# Patient Record
Sex: Female | Born: 1990 | Race: Black or African American | Hispanic: No | Marital: Single | Smoking: Current every day smoker
Health system: Southern US, Community
[De-identification: ages and names within clinical notes are randomized; demographics above are authoritative.]

## PROBLEM LIST (undated history)

## (undated) ENCOUNTER — Inpatient Hospital Stay (HOSPITAL_COMMUNITY): Payer: Self-pay

## (undated) DIAGNOSIS — Z87891 Personal history of nicotine dependence: Secondary | ICD-10-CM

## (undated) DIAGNOSIS — J302 Other seasonal allergic rhinitis: Secondary | ICD-10-CM

## (undated) DIAGNOSIS — E669 Obesity, unspecified: Secondary | ICD-10-CM

## (undated) DIAGNOSIS — F1290 Cannabis use, unspecified, uncomplicated: Secondary | ICD-10-CM

## (undated) DIAGNOSIS — O24419 Gestational diabetes mellitus in pregnancy, unspecified control: Secondary | ICD-10-CM

## (undated) DIAGNOSIS — D649 Anemia, unspecified: Secondary | ICD-10-CM

## (undated) HISTORY — DX: Personal history of nicotine dependence: Z87.891

## (undated) HISTORY — DX: Cannabis use, unspecified, uncomplicated: F12.90

## (undated) HISTORY — PX: WISDOM TOOTH EXTRACTION: SHX21

---
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
## 2012-09-11 ENCOUNTER — Emergency Department (HOSPITAL_COMMUNITY)
Admission: EM | Admit: 2012-09-11 | Discharge: 2012-09-11 | Disposition: A | Discharge status: Home / Self Care | Payer: Medicaid Other | Attending: Emergency Medicine | Admitting: Emergency Medicine

## 2012-09-11 ENCOUNTER — Encounter (HOSPITAL_COMMUNITY): Payer: Self-pay | Admitting: Family Medicine

## 2012-09-11 DIAGNOSIS — J45909 Unspecified asthma, uncomplicated: Secondary | ICD-10-CM | POA: Insufficient documentation

## 2012-09-11 DIAGNOSIS — Z3202 Encounter for pregnancy test, result negative: Secondary | ICD-10-CM | POA: Insufficient documentation

## 2012-09-11 DIAGNOSIS — N920 Excessive and frequent menstruation with regular cycle: Secondary | ICD-10-CM

## 2012-09-11 DIAGNOSIS — Z79899 Other long term (current) drug therapy: Secondary | ICD-10-CM | POA: Insufficient documentation

## 2012-09-11 DIAGNOSIS — F17200 Nicotine dependence, unspecified, uncomplicated: Secondary | ICD-10-CM | POA: Insufficient documentation

## 2012-09-11 LAB — URINE MICROSCOPIC-ADD ON

## 2012-09-11 LAB — URINALYSIS, ROUTINE W REFLEX MICROSCOPIC
Glucose, UA: NEGATIVE mg/dL
Specific Gravity, Urine: 1.027 (ref 1.005–1.030)
pH: 6.5 (ref 5.0–8.0)

## 2012-09-11 LAB — POCT I-STAT, CHEM 8
BUN: 15 mg/dL (ref 6–23)
Calcium, Ion: 1.27 mmol/L — ABNORMAL HIGH (ref 1.12–1.23)
HCT: 30 % — ABNORMAL LOW (ref 36.0–46.0)
Hemoglobin: 10.2 g/dL — ABNORMAL LOW (ref 12.0–15.0)
Sodium: 141 mEq/L (ref 135–145)
TCO2: 24 mmol/L (ref 0–100)

## 2012-09-11 LAB — POCT PREGNANCY, URINE: Preg Test, Ur: NEGATIVE

## 2012-09-11 MED ORDER — MEDROXYPROGESTERONE ACETATE 10 MG PO TABS: 10.0000 mg | ORAL_TABLET | Freq: Every day | ORAL | Status: DC

## 2012-09-11 MED ORDER — SODIUM CHLORIDE 0.9 % IV BOLUS (SEPSIS)
1000.0000 mL | Freq: Once | INTRAVENOUS | Status: AC
  Administered 2012-09-11: 1000 mL via INTRAVENOUS

## 2012-09-11 NOTE — ED Provider Notes (Signed)
CSN: [id number]     Arrival date & time 09/11/12  1910 History   First MD Initiated Contact with Patient 09/11/12 2044     Chief Complaint  Patient presents with  . Vaginal Bleeding   (Consider location/radiation/quality/duration/timing/severity/associated sxs/prior Treatment) HPI Patient presents with vaginal bleeding for the past 3 days. The patient states that this has been heavier than her period. The patient states, that she does not have any headache, blurred vision, fever, nausea, vomiting, weakness, numbness, dizziness, chest pain, or shortness of breath.  Patient, states, that she has had some irregular periods in the past.  Patient denies take any medications for her symptoms. Past Medical History  Diagnosis Date  . Asthma    History reviewed. No pertinent past surgical history. No family history on file. History  Substance Use Topics  . Smoking status: Current Every Day Smoker -- 0.25 packs/day    Types: Cigarettes  . Smokeless tobacco: Not on file  . Alcohol Use: Yes     Comment: Occasional    OB History   Grav Para Term Preterm Abortions TAB SAB Ect Mult Living                 Review of Systems All other systems negative except as documented in the HPI. All pertinent positives and negatives as reviewed in the HPI.  Allergies  Review of patient's allergies indicates no known allergies.  Home Medications   Current Outpatient Rx  Name  Route  Sig  Dispense  Refill  . albuterol (PROVENTIL HFA;VENTOLIN HFA) 108 (90 BASE) MCG/ACT inhaler   Inhalation   Inhale 2 puffs into the lungs every 6 (six) hours as needed for wheezing or shortness of breath.           BP 106/68  Pulse 84  Temp(Src) 98.6 F (37 C) (Oral)  Resp 15  Ht 5\' 6"  (1.676 m)  Wt 241 lb 12.8 oz (109.68 kg)  BMI 39.05 kg/m2  SpO2 100%  LMP 05/20/2012 Physical Exam  Nursing note and vitals reviewed. Constitutional: She is oriented to person, place, and time. She appears well-developed and  well-nourished. No distress.  HENT:  Head: Normocephalic and atraumatic.  Mouth/Throat: Oropharynx is clear and moist.  Neck: Normal range of motion. Neck supple.  Cardiovascular: Normal rate and regular rhythm.   Pulmonary/Chest: Effort normal and breath sounds normal.  Abdominal: Soft. Bowel sounds are normal. She exhibits no distension. There is no tenderness.  Genitourinary:  Patient defers.  Pelvic exam at this time  Neurological: She is alert and oriented to person, place, and time.  Skin: Skin is warm and dry.    ED Course  Procedures (including critical care time) Labs Review Labs Reviewed  URINALYSIS, ROUTINE W REFLEX MICROSCOPIC - Abnormal; Notable for the following:    APPearance CLOUDY (*)    Hgb urine dipstick LARGE (*)    Leukocytes, UA TRACE (*)    All other components within normal limits  POCT I-STAT, CHEM 8 - Abnormal; Notable for the following:    Creatinine, Ser 1.20 (*)    Calcium, Ion 1.27 (*)    Hemoglobin 10.2 (*)    HCT 30.0 (*)    All other components within normal limits  URINE MICROSCOPIC-ADD ON  POCT PREGNANCY, URINE   Patient is referred to GYN clinic at [facility] hospital.  Patient, states, that she will followup as directed MDM      Carlyle Dolly, PA-C 09/16/12 1600

## 2012-09-11 NOTE — ED Notes (Addendum)
Patient states that she has been having vaginal bleeding for the past month. States flow is heavy and passing clots. States she could possibly be pregnant. States that her menstrual cycle is irregular. Has not seen a medical provider for this bleeding.

## 2012-09-17 NOTE — ED Provider Notes (Signed)
  Medical screening examination/treatment/procedure(s) were performed by non-physician practitioner and as supervising physician I was immediately available for consultation/collaboration.    Elana Jian, MD 09/17/12 0727 

## 2012-10-11 ENCOUNTER — Emergency Department (HOSPITAL_COMMUNITY)
Admission: EM | Admit: 2012-10-11 | Discharge: 2012-10-11 | Disposition: A | Discharge status: Home / Self Care | Payer: Medicaid Other | Attending: Emergency Medicine | Admitting: Emergency Medicine

## 2012-10-11 ENCOUNTER — Encounter (HOSPITAL_COMMUNITY): Payer: Self-pay | Admitting: Emergency Medicine

## 2012-10-11 DIAGNOSIS — F17200 Nicotine dependence, unspecified, uncomplicated: Secondary | ICD-10-CM | POA: Insufficient documentation

## 2012-10-11 DIAGNOSIS — IMO0002 Reserved for concepts with insufficient information to code with codable children: Secondary | ICD-10-CM | POA: Insufficient documentation

## 2012-10-11 DIAGNOSIS — J45909 Unspecified asthma, uncomplicated: Secondary | ICD-10-CM | POA: Insufficient documentation

## 2012-10-11 DIAGNOSIS — S4980XA Other specified injuries of shoulder and upper arm, unspecified arm, initial encounter: Secondary | ICD-10-CM | POA: Insufficient documentation

## 2012-10-11 DIAGNOSIS — Z79899 Other long term (current) drug therapy: Secondary | ICD-10-CM | POA: Insufficient documentation

## 2012-10-11 DIAGNOSIS — S46909A Unspecified injury of unspecified muscle, fascia and tendon at shoulder and upper arm level, unspecified arm, initial encounter: Secondary | ICD-10-CM | POA: Insufficient documentation

## 2012-10-11 DIAGNOSIS — S0993XA Unspecified injury of face, initial encounter: Secondary | ICD-10-CM | POA: Insufficient documentation

## 2012-10-11 MED ORDER — HYDROCODONE-ACETAMINOPHEN 5-325 MG PO TABS: 2.0000 | ORAL_TABLET | ORAL | Status: DC | PRN

## 2012-10-11 MED ORDER — HYDROCODONE-ACETAMINOPHEN 5-325 MG PO TABS
2.0000 | ORAL_TABLET | Freq: Once | ORAL | Status: AC
  Administered 2012-10-11: 2 via ORAL
  Filled 2012-10-11: qty 2
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
## 2012-10-12 ENCOUNTER — Encounter (HOSPITAL_COMMUNITY): Payer: Self-pay | Admitting: Emergency Medicine

## 2012-10-12 ENCOUNTER — Emergency Department (HOSPITAL_COMMUNITY)
Admission: EM | Admit: 2012-10-12 | Discharge: 2012-10-12 | Disposition: A | Discharge status: Home / Self Care | Payer: Medicaid Other | Attending: Emergency Medicine | Admitting: Emergency Medicine

## 2012-10-12 DIAGNOSIS — R300 Dysuria: Secondary | ICD-10-CM | POA: Insufficient documentation

## 2012-10-12 DIAGNOSIS — F17200 Nicotine dependence, unspecified, uncomplicated: Secondary | ICD-10-CM | POA: Insufficient documentation

## 2012-10-12 DIAGNOSIS — J3489 Other specified disorders of nose and nasal sinuses: Secondary | ICD-10-CM | POA: Insufficient documentation

## 2012-10-12 DIAGNOSIS — R109 Unspecified abdominal pain: Secondary | ICD-10-CM | POA: Insufficient documentation

## 2012-10-12 DIAGNOSIS — E669 Obesity, unspecified: Secondary | ICD-10-CM | POA: Insufficient documentation

## 2012-10-12 DIAGNOSIS — Z79899 Other long term (current) drug therapy: Secondary | ICD-10-CM | POA: Insufficient documentation

## 2012-10-12 DIAGNOSIS — M545 Low back pain, unspecified: Secondary | ICD-10-CM | POA: Insufficient documentation

## 2012-10-12 DIAGNOSIS — S39012A Strain of muscle, fascia and tendon of lower back, initial encounter: Secondary | ICD-10-CM

## 2012-10-12 DIAGNOSIS — Z3202 Encounter for pregnancy test, result negative: Secondary | ICD-10-CM | POA: Insufficient documentation

## 2012-10-12 DIAGNOSIS — G44209 Tension-type headache, unspecified, not intractable: Secondary | ICD-10-CM | POA: Insufficient documentation

## 2012-10-12 DIAGNOSIS — Z87440 Personal history of urinary (tract) infections: Secondary | ICD-10-CM | POA: Insufficient documentation

## 2012-10-12 DIAGNOSIS — R350 Frequency of micturition: Secondary | ICD-10-CM | POA: Insufficient documentation

## 2012-10-12 DIAGNOSIS — G8911 Acute pain due to trauma: Secondary | ICD-10-CM | POA: Insufficient documentation

## 2012-10-12 DIAGNOSIS — J45909 Unspecified asthma, uncomplicated: Secondary | ICD-10-CM | POA: Insufficient documentation

## 2012-10-12 DIAGNOSIS — R1030 Lower abdominal pain, unspecified: Secondary | ICD-10-CM

## 2012-10-12 HISTORY — DX: Obesity, unspecified: E66.9

## 2012-10-12 LAB — URINE MICROSCOPIC-ADD ON

## 2012-10-12 LAB — URINALYSIS, ROUTINE W REFLEX MICROSCOPIC
Bilirubin Urine: NEGATIVE
Glucose, UA: NEGATIVE mg/dL
Hgb urine dipstick: NEGATIVE
Ketones, ur: NEGATIVE mg/dL
Protein, ur: NEGATIVE mg/dL
Urobilinogen, UA: 1 mg/dL (ref 0.0–1.0)
pH: 6 (ref 5.0–8.0)

## 2012-10-12 MED ORDER — HYDROCODONE-ACETAMINOPHEN 5-325 MG PO TABS: 1.0000 | ORAL_TABLET | Freq: Four times a day (QID) | ORAL | Status: DC | PRN

## 2012-10-12 NOTE — ED Notes (Signed)
Pt in gown.

## 2012-10-12 NOTE — ED Notes (Signed)
Reports intermittent headaches and lower abd pain that radiates around to her back, reports that her bladder hurts. No acute distress noted at triage.
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
## 2012-10-12 NOTE — ED Notes (Signed)
Pt reports she is here because she has been having frequent HAs on and off for a few months, has been taking ibuprofen at home and it works but then the HAs come back later on. No neuro deficits seen. Pt also c/o right flank pain X 2 months and bilateral lower/pelvic pain that started last week. sts sometimes painful to urinate along with urinary frequency. Denies vaginal d/c. Denies fevers at home. Pt in nad, skin warm and dry, resp e/u.

## 2012-10-13 NOTE — ED Provider Notes (Signed)
Medical screening examination/treatment/procedure(s) were performed by non-physician practitioner and as supervising physician I was immediately available for consultation/collaboration.  Ismael Karge R. Joesiah Lonon, MD 10/13/12 2336 

## 2012-10-15 LAB — URINE CULTURE: Colony Count: >=100000

## 2012-10-18 NOTE — ED Provider Notes (Signed)
I saw and evaluated the patient, reviewed the resident's note and I agree with the findings and plan.   .Face to face Exam:  General:  Awake HEENT:  Atraumatic Resp:  Normal effort Abd:  Nondistended Neuro:No focal weakness   Nelia Shi, MD 10/18/12 [phone]

## 2012-10-30 ENCOUNTER — Encounter (HOSPITAL_COMMUNITY): Payer: Self-pay | Admitting: Emergency Medicine

## 2012-10-30 ENCOUNTER — Emergency Department (INDEPENDENT_AMBULATORY_CARE_PROVIDER_SITE_OTHER)
Admission: EM | Admit: 2012-10-30 | Discharge: 2012-10-30 | Disposition: A | Discharge status: Home / Self Care | Payer: Medicaid Other | Source: Home / Self Care | Attending: Family Medicine | Admitting: Family Medicine

## 2012-10-30 DIAGNOSIS — M62838 Other muscle spasm: Secondary | ICD-10-CM

## 2012-10-30 MED ORDER — CYCLOBENZAPRINE HCL 10 MG PO TABS: 10.0000 mg | ORAL_TABLET | Freq: Two times a day (BID) | ORAL | Status: DC | PRN
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
## 2012-10-30 NOTE — ED Notes (Signed)
Pt c/o neck/upper back pain onset this am when she woke up Pain increases when she turns to the right Denies inj/trauma, strenuous activity Alert w/no signs of acute distress.

## 2012-11-05 NOTE — ED Provider Notes (Signed)
Medical screening examination/treatment/procedure(s) were performed by resident physician or non-physician practitioner and as supervising physician I was immediately available for consultation/collaboration.   Tiari Andringa DOUGLAS MD.   Aysa Larivee D Juvencio Verdi, MD 11/05/12 1501 

## 2012-12-10 ENCOUNTER — Institutional Professional Consult (permissible substitution): Payer: Self-pay | Admitting: Pediatrics

## 2012-12-20 ENCOUNTER — Ambulatory Visit: Payer: Medicaid Other | Admitting: *Deleted

## 2012-12-20 DIAGNOSIS — Z3200 Encounter for pregnancy test, result unknown: Secondary | ICD-10-CM

## 2012-12-20 DIAGNOSIS — N912 Amenorrhea, unspecified: Secondary | ICD-10-CM

## 2012-12-20 NOTE — Progress Notes (Signed)
Tamara Bryant here to get pregnancy test. States she has  Had irregular periods in the past , then they got regular, then she hasn't had one since September.  States had home pregnancy test positive.  Urine pregnancy test negative in clinic today. Discussed with Dr. Debroah Loop. Bhcg ordered .

## 2012-12-22 ENCOUNTER — Encounter (HOSPITAL_COMMUNITY): Payer: Self-pay | Admitting: Emergency Medicine

## 2012-12-22 ENCOUNTER — Emergency Department (HOSPITAL_COMMUNITY)
Admission: EM | Admit: 2012-12-22 | Discharge: 2012-12-22 | Disposition: A | Discharge status: Home / Self Care | Payer: Medicaid Other | Attending: Emergency Medicine | Admitting: Emergency Medicine

## 2012-12-22 DIAGNOSIS — J45909 Unspecified asthma, uncomplicated: Secondary | ICD-10-CM | POA: Insufficient documentation

## 2012-12-22 DIAGNOSIS — E669 Obesity, unspecified: Secondary | ICD-10-CM | POA: Insufficient documentation

## 2012-12-22 DIAGNOSIS — F17200 Nicotine dependence, unspecified, uncomplicated: Secondary | ICD-10-CM | POA: Insufficient documentation

## 2012-12-22 DIAGNOSIS — Z79899 Other long term (current) drug therapy: Secondary | ICD-10-CM | POA: Insufficient documentation

## 2012-12-22 DIAGNOSIS — R05 Cough: Secondary | ICD-10-CM

## 2012-12-22 MED ORDER — HYDROCOD POLST-CHLORPHEN POLST 10-8 MG/5ML PO LQCR: 5.0000 mL | Freq: Two times a day (BID) | ORAL | Status: DC | PRN

## 2012-12-22 NOTE — ED Notes (Signed)
The pt has had a cough for 2-3 days just mucous.  She saw her doctor 2 days ago at [facility]Marland Kitchen  Hx of astma no sob

## 2012-12-22 NOTE — ED Provider Notes (Signed)
CSN: [id number]     Arrival date & time 12/22/12  1709 History   First MD Initiated Contact with Patient 12/22/12 1812     Chief Complaint  Patient presents with  . Cough   (Consider location/radiation/quality/duration/timing/severity/associated sxs/prior Treatment) HPI Comments: Pt has not needed her inhaler:no fever:pt has used nyquil  Patient is a 21 y.o. female presenting with cough. The history is provided by the patient. No language interpreter was used.  Cough Cough characteristics:  Productive Sputum characteristics:  Nondescript Severity:  Moderate Onset quality:  Sudden Timing:  Constant Progression:  Unchanged Relieved by:  Nothing Associated symptoms: no fever     Past Medical History  Diagnosis Date  . Asthma   . Obesity    History reviewed. No pertinent past surgical history. No family history on file. History  Substance Use Topics  . Smoking status: Current Every Day Smoker -- 0.25 packs/day    Types: Cigarettes  . Smokeless tobacco: Not on file  . Alcohol Use: Yes     Comment: Occasional    OB History   Grav Para Term Preterm Abortions TAB SAB Ect Mult Living                 Review of Systems  Constitutional: Negative for fever.  Respiratory: Positive for cough.   Cardiovascular: Negative.     Allergies  Review of patient's allergies indicates no known allergies.  Home Medications   Current Outpatient Rx  Name  Route  Sig  Dispense  Refill  . albuterol (PROVENTIL HFA;VENTOLIN HFA) 108 (90 BASE) MCG/ACT inhaler   Inhalation   Inhale 2 puffs into the lungs every 6 (six) hours as needed for wheezing or shortness of breath.          . chlorpheniramine-HYDROcodone (TUSSIONEX PENNKINETIC ER) 10-8 MG/5ML LQCR   Oral   Take 5 mLs by mouth every 12 (twelve) hours as needed for cough.   120 mL   0    BP 125/70  Pulse 75  Temp(Src) 99.3 F (37.4 C) (Oral)  Resp 18  Wt 228 lb 9.6 oz (103.692 kg)  SpO2 99% Physical Exam  Nursing note and  vitals reviewed. Constitutional: She appears well-developed and well-nourished.  HENT:  Right Ear: External ear normal.  Nose: Rhinorrhea present.  Mouth/Throat: Oropharynx is clear and moist.  Cardiovascular: Normal rate and regular rhythm.   Pulmonary/Chest: Effort normal and breath sounds normal.    ED Course  Procedures (including critical care time) Labs Review Labs Reviewed - No data to display Imaging Review No results found.  EKG Interpretation   None       MDM   1. Cough    Likely WUJ:WJXB treat symptomatically with something for the cough    Teressa Lower, NP 12/22/12 1821

## 2012-12-23 NOTE — Progress Notes (Signed)
Pt aware of results 

## 2012-12-27 NOTE — ED Provider Notes (Signed)
Medical screening examination/treatment/procedure(s) were performed by non-physician practitioner and as supervising physician I was immediately available for consultation/collaboration.  EKG Interpretation   None         Eugenie Harewood J. Amanuel Sinkfield, MD 12/27/12 0707 

## 2013-01-17 ENCOUNTER — Encounter (HOSPITAL_COMMUNITY): Payer: Self-pay | Admitting: *Deleted

## 2013-01-17 ENCOUNTER — Inpatient Hospital Stay (HOSPITAL_COMMUNITY)
Admission: AD | Admit: 2013-01-17 | Discharge: 2013-01-17 | Disposition: A | Discharge status: Home / Self Care | Payer: Medicaid Other | Source: Ambulatory Visit | Attending: Obstetrics & Gynecology | Admitting: Obstetrics & Gynecology

## 2013-01-17 DIAGNOSIS — N921 Excessive and frequent menstruation with irregular cycle: Secondary | ICD-10-CM

## 2013-01-17 DIAGNOSIS — N920 Excessive and frequent menstruation with regular cycle: Secondary | ICD-10-CM | POA: Insufficient documentation

## 2013-01-17 LAB — ABO/RH: ABO/RH(D): A POS

## 2013-01-17 LAB — CBC
HCT: 31.8 % — ABNORMAL LOW (ref 36.0–46.0)
Hemoglobin: 9.6 g/dL — ABNORMAL LOW (ref 12.0–15.0)
MCH: 21 pg — ABNORMAL LOW (ref 26.0–34.0)
MCHC: 30.2 g/dL (ref 30.0–36.0)
MCV: 69.4 fL — AB (ref 78.0–100.0)
PLATELETS: 465 10*3/uL — AB (ref 150–400)
RBC: 4.58 MIL/uL (ref 3.87–5.11)
RDW: 20.8 % — AB (ref 11.5–15.5)
WBC: 10.9 10*3/uL — AB (ref 4.0–10.5)

## 2013-01-17 LAB — WET PREP, GENITAL
Trich, Wet Prep: NONE SEEN
WBC, Wet Prep HPF POC: NONE SEEN
YEAST WET PREP: NONE SEEN

## 2013-01-17 LAB — HCG, QUANTITATIVE, PREGNANCY: hCG, Beta Chain, Quant, S: <1 m[IU]/mL (ref <5)

## 2013-01-17 MED ORDER — MEGESTROL ACETATE 40 MG PO TABS: 40.0000 mg | ORAL_TABLET | Freq: Every day | ORAL | Status: DC

## 2013-01-17 NOTE — Discharge Instructions (Signed)

## 2013-01-17 NOTE — MAU Note (Signed)
Bleeding started getting heavy last night, having to change hourly.  Had +HPT last month,  [facility]"  And test was  Neg.

## 2013-01-17 NOTE — MAU Provider Note (Signed)
History     CSN: [id number]  Arrival date and time: 01/17/13 [id number]   First Provider Initiated Contact with Patient 01/17/13 1913      Chief Complaint  Patient presents with  . Vaginal Bleeding   HPI  Tamara Bryant is a 23 y.o. who presents today with bleeding. She states that she has been bleeding since 12/31/12. She states that she had a +UPT at home, and a negative one at a "clinic". The she started bleeding. She has continued to bleed since then. She has also had "really bad" cramps.   Past Medical History  Diagnosis Date  . Asthma   . Obesity     Past Surgical History  Procedure Laterality Date  . Wisdom tooth extraction      Family History  Problem Relation Age of Onset  . Hypertension Mother   . Diabetes Father   . Hypertension Father   . Diabetes Maternal Grandmother   . Diabetes Paternal Grandfather     History  Substance Use Topics  . Smoking status: Current Every Day Smoker -- 0.25 packs/day    Types: Cigarettes  . Smokeless tobacco: Not on file  . Alcohol Use: Yes     Comment: Occasional     Allergies: No Known Allergies  Prescriptions prior to admission  Medication Sig Dispense Refill  . albuterol (PROVENTIL HFA;VENTOLIN HFA) 108 (90 BASE) MCG/ACT inhaler Inhale 2 puffs into the lungs every 6 (six) hours as needed for wheezing or shortness of breath.       . chlorpheniramine-HYDROcodone (TUSSIONEX PENNKINETIC ER) 10-8 MG/5ML LQCR Take 5 mLs by mouth every 12 (twelve) hours as needed for cough.  120 mL  0    ROS Physical Exam   Blood pressure 116/70, pulse 81, temperature 98.9 F (37.2 C), temperature source Oral, resp. rate 18, height 5' 3.5" (1.613 m), weight 101.152 kg (223 lb), last menstrual period 12/31/2012.  Physical Exam  Nursing note and vitals reviewed. Constitutional: She is oriented to person, place, and time. She appears well-developed and well-nourished. No distress.  Cardiovascular: Normal rate.   Respiratory: Effort normal.   GI: Soft. There is no tenderness.  Genitourinary:   External: no lesion Vagina: small amount of blood  Cervix: pink, smooth, no CMT Uterus: NSSC Adnexa: NT   Neurological: She is alert and oriented to person, place, and time.  Skin: Skin is warm and dry.  Psychiatric: She has a normal mood and affect.    MAU Course  Procedures  Results for orders placed during the hospital encounter of 01/17/13 (from the past 24 hour(s))  CBC     Status: Abnormal   Collection Time    01/17/13  6:35 PM      Result Value Range   WBC 10.9 (*) 4.0 - 10.5 K/uL   RBC 4.58  3.87 - 5.11 MIL/uL   Hemoglobin 9.6 (*) 12.0 - 15.0 g/dL   HCT 11.931.8 (*) 14.736.0 - 82.946.0 %   MCV 69.4 (*) 78.0 - 100.0 fL   MCH 21.0 (*) 26.0 - 34.0 pg   MCHC 30.2  30.0 - 36.0 g/dL   RDW 56.220.8 (*) 13.011.5 - 86.515.5 %   Platelets 465 (*) 150 - 400 K/uL  ABO/RH     Status: None   Collection Time    01/17/13  6:35 PM      Result Value Range   ABO/RH(D) A POS    HCG, QUANTITATIVE, PREGNANCY     Status: None   Collection Time  01/17/13  6:35 PM      Result Value Range   hCG, Beta Chain, Quant, S <1  <5 mIU/mL  WET PREP, GENITAL     Status: Abnormal   Collection Time    01/17/13  7:25 PM      Result Value Range   Yeast Wet Prep HPF POC NONE SEEN  NONE SEEN   Trich, Wet Prep NONE SEEN  NONE SEEN   Clue Cells Wet Prep HPF POC FEW (*) NONE SEEN   WBC, Wet Prep HPF POC NONE SEEN  NONE SEEN    Assessment and Plan   1. Menometrorrhagia      Medication List         albuterol 108 (90 BASE) MCG/ACT inhaler  Commonly known as:  PROVENTIL HFA;VENTOLIN HFA  Inhale 2 puffs into the lungs every 6 (six) hours as needed for wheezing or shortness of breath.     chlorpheniramine-HYDROcodone 10-8 MG/5ML Lqcr  Commonly known as:  TUSSIONEX PENNKINETIC ER  Take 5 mLs by mouth every 12 (twelve) hours as needed for cough.     megestrol 40 MG tablet  Commonly known as:  MEGACE  Take 1 tablet (40 mg total) by mouth daily. Take 3 PO for 3  days. Then 2 PO for 2 days. Then 1 PO for up to one week as needed for bleeding       Follow-up Information   Schedule an appointment as soon as possible for a visit with [facility].   Specialty:  Obstetrics and Gynecology   Contact information:   [address] [place] [address] [phone]       Tawnya Crook 01/17/2013, 7:14 PM

## 2013-01-18 LAB — GC/CHLAMYDIA PROBE AMP
CT Probe RNA: NEGATIVE
GC Probe RNA: NEGATIVE

## 2013-01-28 ENCOUNTER — Encounter: Payer: Self-pay | Admitting: Medical

## 2013-03-17 ENCOUNTER — Telehealth: Payer: Self-pay

## 2013-03-17 ENCOUNTER — Encounter: Payer: Medicaid Other | Admitting: Medical

## 2013-03-17 NOTE — Telephone Encounter (Signed)
Called pt. Whom missed her appointment today with Joseph BerkshireJulie Ethier, NP. Pt. States she did not have a ride but would like to re-schedule. Asked pt. How her symptoms are, pt. States she is still bleeding on and off and that she doesn't know if it is her period and states "its just very frustrating." Will re-schedule pt. For appointment with Dr. Jolayne Pantheronstant on 03/27/13 at 1245.

## 2013-03-26 ENCOUNTER — Encounter (HOSPITAL_COMMUNITY): Payer: Self-pay | Admitting: Emergency Medicine

## 2013-03-26 ENCOUNTER — Emergency Department (HOSPITAL_COMMUNITY)
Admission: EM | Admit: 2013-03-26 | Discharge: 2013-03-26 | Disposition: A | Discharge status: Home / Self Care | Payer: Medicaid Other | Attending: Emergency Medicine | Admitting: Emergency Medicine

## 2013-03-26 DIAGNOSIS — F17200 Nicotine dependence, unspecified, uncomplicated: Secondary | ICD-10-CM | POA: Insufficient documentation

## 2013-03-26 DIAGNOSIS — J45909 Unspecified asthma, uncomplicated: Secondary | ICD-10-CM | POA: Insufficient documentation

## 2013-03-26 DIAGNOSIS — N9489 Other specified conditions associated with female genital organs and menstrual cycle: Secondary | ICD-10-CM | POA: Insufficient documentation

## 2013-03-26 DIAGNOSIS — R300 Dysuria: Secondary | ICD-10-CM | POA: Insufficient documentation

## 2013-03-26 DIAGNOSIS — Z3202 Encounter for pregnancy test, result negative: Secondary | ICD-10-CM | POA: Insufficient documentation

## 2013-03-26 DIAGNOSIS — E669 Obesity, unspecified: Secondary | ICD-10-CM | POA: Insufficient documentation

## 2013-03-26 DIAGNOSIS — N898 Other specified noninflammatory disorders of vagina: Secondary | ICD-10-CM

## 2013-03-26 DIAGNOSIS — Z79899 Other long term (current) drug therapy: Secondary | ICD-10-CM | POA: Insufficient documentation

## 2013-03-26 LAB — URINALYSIS, ROUTINE W REFLEX MICROSCOPIC
BILIRUBIN URINE: NEGATIVE
Glucose, UA: NEGATIVE mg/dL
Hgb urine dipstick: NEGATIVE
KETONES UR: NEGATIVE mg/dL
Leukocytes, UA: NEGATIVE
NITRITE: NEGATIVE
Protein, ur: NEGATIVE mg/dL
Specific Gravity, Urine: 1.029 (ref 1.005–1.030)
Urobilinogen, UA: 1 mg/dL (ref 0.0–1.0)
pH: 6 (ref 5.0–8.0)

## 2013-03-26 LAB — POC URINE PREG, ED: Preg Test, Ur: NEGATIVE

## 2013-03-26 LAB — WET PREP, GENITAL
CLUE CELLS WET PREP: NONE SEEN
TRICH WET PREP: NONE SEEN
Yeast Wet Prep HPF POC: NONE SEEN

## 2013-03-26 LAB — GC/CHLAMYDIA PROBE AMP
CT PROBE, AMP APTIMA: NEGATIVE
GC Probe RNA: NEGATIVE

## 2013-03-26 MED ORDER — FLUCONAZOLE 150 MG PO TABS
150.0000 mg | ORAL_TABLET | Freq: Once | ORAL | Status: AC
  Administered 2013-03-26: 150 mg via ORAL
  Filled 2013-03-26: qty 1

## 2013-03-26 NOTE — Discharge Instructions (Signed)
Use over-the-counter monistat for vaginal itching.  You can try benadryl and cool compresses as well.  Try to avoid scratching in order to prevent secondary bacterial infection.  Return to the ER if your symptoms worsen or you develop associated fever or severe abdominal/pelvic pain.

## 2013-03-26 NOTE — ED Provider Notes (Signed)
CSN: [id number]     Arrival date & time 03/26/13  0051 History   First MD Initiated Contact with Patient 03/26/13 0114     Chief Complaint  Patient presents with  . Vaginal Pain     (Consider location/radiation/quality/duration/timing/severity/associated sxs/prior Treatment) HPI History provided by pt.   Pt presents w/ 3 days of vaginal pruritis and burning.  Onset pruritis prior to pain and pt suspects that pain is secondary to scratching.  Sx worsened today.  Associated w/ mild dysuria.  Denies fever, abdominal pain and other GU sx.  Was treated w/ flagyl for asymptomatic BV 2-3 weeks ago.   Past Medical History  Diagnosis Date  . Asthma   . Obesity    Past Surgical History  Procedure Laterality Date  . Wisdom tooth extraction     Family History  Problem Relation Age of Onset  . Hypertension Mother   . Sickle cell anemia Mother   . Diabetes Father   . Hypertension Father   . Diabetes Maternal Grandmother   . Diabetes Paternal Grandfather    History  Substance Use Topics  . Smoking status: Current Every Day Smoker -- 0.25 packs/day    Types: Cigarettes  . Smokeless tobacco: Not on file  . Alcohol Use: Yes     Comment: Occasional    OB History   Grav Para Term Preterm Abortions TAB SAB Ect Mult Living                 Review of Systems  All other systems reviewed and are negative.      Allergies  Review of patient's allergies indicates no known allergies.  Home Medications   Current Outpatient Rx  Name  Route  Sig  Dispense  Refill  . albuterol (PROVENTIL HFA;VENTOLIN HFA) 108 (90 BASE) MCG/ACT inhaler   Inhalation   Inhale 2 puffs into the lungs every 6 (six) hours as needed for wheezing or shortness of breath.           BP 115/79  Pulse 111  Resp 16  SpO2 98%  LMP 02/16/2013 Physical Exam  Nursing note and vitals reviewed. Constitutional: She is oriented to person, place, and time. She appears well-developed and well-nourished. No distress.   HENT:  Head: Normocephalic and atraumatic.  Eyes:  Normal appearance  Neck: Normal range of motion.  Cardiovascular: Normal rate and regular rhythm.   Pulmonary/Chest: Effort normal and breath sounds normal. No respiratory distress.  Abdominal: Soft. Bowel sounds are normal. She exhibits no distension and no mass. There is no tenderness. There is no rebound and no guarding.  obese  Genitourinary:  No CVA tenderness.  Nml external genitalia w/ exception of thick white vaginal discharge.  Small amt of white   Discharge in vaginal vault.  Cervix closed and appears nml.  Diffuse, mild ttp on bimanual exam.  Musculoskeletal: Normal range of motion.  Neurological: She is alert and oriented to person, place, and time.  Skin: Skin is warm and dry. No rash noted.  Psychiatric: She has a normal mood and affect. Her behavior is normal.    ED Course  Procedures (including critical care time) Labs Review Labs Reviewed  WET PREP, GENITAL - Abnormal; Notable for the following:    WBC, Wet Prep HPF POC FEW (*)    All other components within normal limits  GC/CHLAMYDIA PROBE AMP  URINALYSIS, ROUTINE W REFLEX MICROSCOPIC  POC URINE PREG, ED   Imaging Review No results found.   EKG Interpretation  None      MDM   Final diagnoses:  Vaginal itching   Healthy Irena Reichmann22yo F presents w/ vaginal pruritis and pain x 3 days.  Pain attributed to scratching.  Treated for asymptomatic BV w/ flagyl 2-3 weeks ago.  On exam, afebrile, non-toxic appearing, abd benign, thick, white vaginal discharge and diffuse, mild tenderness on bimanual exam.  Wet prep and Gc/chlam pending. 2:12 AM   Labs unremarkable.  Because patient clinically has vaginal candidiasis, treated w/ 150mg  diflucan in ED and recommended OTC monistat as well.  Advised avoidance of scratching and benadryl for severe pruritis.  Return precautions discussed.    Otilio Miuatherine E Abrar Koone, PA-C 03/26/13 [phone]

## 2013-03-26 NOTE — ED Provider Notes (Signed)
Medical screening examination/treatment/procedure(s) were performed by non-physician practitioner and as supervising physician I was immediately available for consultation/collaboration.   EKG Interpretation None       Olivia Mackielga M Oral Remache, MD 03/26/13 [phone]

## 2013-03-26 NOTE — ED Notes (Signed)
Pt came in by EMS with c/o vaginal pain and itching  Pt washed some clothes then put them on and started having itching so she scratched herself and has been hurting since  Onset of sxs was 3 days ago

## 2013-03-27 ENCOUNTER — Ambulatory Visit: Payer: Medicaid Other | Admitting: Obstetrics & Gynecology

## 2013-03-27 ENCOUNTER — Telehealth: Payer: Self-pay | Admitting: General Practice

## 2013-03-27 NOTE — Telephone Encounter (Signed)
Called patient because she missed her appt with us today, patient stated she tried to call but couldn't reach anyone and would like to reschedule. Told patient our front office staff will contact her with a new appt

## 2013-04-03 ENCOUNTER — Encounter: Payer: Self-pay | Admitting: Advanced Practice Midwife

## 2013-04-08 ENCOUNTER — Inpatient Hospital Stay (HOSPITAL_COMMUNITY): Payer: Medicaid Other

## 2013-04-08 ENCOUNTER — Encounter (HOSPITAL_COMMUNITY): Payer: Self-pay | Admitting: *Deleted

## 2013-04-08 ENCOUNTER — Inpatient Hospital Stay (HOSPITAL_COMMUNITY)
Admission: AD | Admit: 2013-04-08 | Discharge: 2013-04-08 | Disposition: A | Discharge status: Home / Self Care | Payer: Medicaid Other | Source: Ambulatory Visit | Attending: Family Medicine | Admitting: Family Medicine

## 2013-04-08 DIAGNOSIS — R109 Unspecified abdominal pain: Secondary | ICD-10-CM | POA: Insufficient documentation

## 2013-04-08 DIAGNOSIS — Z8249 Family history of ischemic heart disease and other diseases of the circulatory system: Secondary | ICD-10-CM | POA: Insufficient documentation

## 2013-04-08 DIAGNOSIS — J45909 Unspecified asthma, uncomplicated: Secondary | ICD-10-CM | POA: Insufficient documentation

## 2013-04-08 DIAGNOSIS — N939 Abnormal uterine and vaginal bleeding, unspecified: Secondary | ICD-10-CM

## 2013-04-08 DIAGNOSIS — F17200 Nicotine dependence, unspecified, uncomplicated: Secondary | ICD-10-CM | POA: Insufficient documentation

## 2013-04-08 DIAGNOSIS — J302 Other seasonal allergic rhinitis: Secondary | ICD-10-CM

## 2013-04-08 DIAGNOSIS — D649 Anemia, unspecified: Secondary | ICD-10-CM

## 2013-04-08 DIAGNOSIS — N898 Other specified noninflammatory disorders of vagina: Secondary | ICD-10-CM | POA: Insufficient documentation

## 2013-04-08 LAB — CBC
HEMATOCRIT: 32.4 % — AB (ref 36.0–46.0)
Hemoglobin: 9.7 g/dL — ABNORMAL LOW (ref 12.0–15.0)
MCH: 20.9 pg — AB (ref 26.0–34.0)
MCHC: 29.9 g/dL — ABNORMAL LOW (ref 30.0–36.0)
MCV: 69.7 fL — AB (ref 78.0–100.0)
PLATELETS: 467 10*3/uL — AB (ref 150–400)
RBC: 4.65 MIL/uL (ref 3.87–5.11)
RDW: 19.5 % — ABNORMAL HIGH (ref 11.5–15.5)
WBC: 11.9 10*3/uL — ABNORMAL HIGH (ref 4.0–10.5)

## 2013-04-08 LAB — URINE MICROSCOPIC-ADD ON

## 2013-04-08 LAB — URINALYSIS, ROUTINE W REFLEX MICROSCOPIC
BILIRUBIN URINE: NEGATIVE
GLUCOSE, UA: NEGATIVE mg/dL
Ketones, ur: NEGATIVE mg/dL
Leukocytes, UA: NEGATIVE
Nitrite: NEGATIVE
PROTEIN: NEGATIVE mg/dL
UROBILINOGEN UA: 1 mg/dL (ref 0.0–1.0)
pH: 6 (ref 5.0–8.0)

## 2013-04-08 MED ORDER — FEXOFENADINE HCL 60 MG PO TABS: 60.0000 mg | ORAL_TABLET | Freq: Two times a day (BID) | ORAL | Status: DC

## 2013-04-08 MED ORDER — NORGESTIMATE-ETH ESTRADIOL 0.25-35 MG-MCG PO TABS: 1.0000 | ORAL_TABLET | Freq: Every day | ORAL | Status: DC

## 2013-04-08 MED ORDER — FERROUS SULFATE 325 (65 FE) MG PO TABS: 325.0000 mg | ORAL_TABLET | Freq: Two times a day (BID) | ORAL | Status: DC

## 2013-04-08 MED ORDER — IBUPROFEN 600 MG PO TABS: 600.0000 mg | ORAL_TABLET | Freq: Four times a day (QID) | ORAL | Status: DC | PRN

## 2013-04-08 MED ORDER — DOCUSATE SODIUM 100 MG PO CAPS: 100.0000 mg | ORAL_CAPSULE | Freq: Every day | ORAL | Status: DC

## 2013-04-08 MED ORDER — KETOROLAC TROMETHAMINE 60 MG/2ML IM SOLN
60.0000 mg | Freq: Once | INTRAMUSCULAR | Status: AC
  Administered 2013-04-08: 60 mg via INTRAMUSCULAR
  Filled 2013-04-08: qty 2

## 2013-04-08 NOTE — MAU Provider Note (Signed)
History     CSN: [id number]  Arrival date and time: 04/08/13 1141   First Provider Initiated Contact with Patient 04/08/13 1334      Chief Complaint  Patient presents with  . Vaginal Bleeding  . Fatigue   HPI Pt is not pregnant and presents with heavy bleeding and feeling weak.  [facility] and told her  Blood count was low and if pt started feeling weak to come to MAU.    [facility], received phone call yesterday telling her that her blood count was low. Pt states very weak, started bleeding last night.      Past Medical History  Diagnosis Date  . Asthma   . Obesity     Past Surgical History  Procedure Laterality Date  . Wisdom tooth extraction      Family History  Problem Relation Age of Onset  . Hypertension Mother   . Sickle cell anemia Mother   . Diabetes Father   . Hypertension Father   . Diabetes Maternal Grandmother   . Diabetes Paternal Grandfather     History  Substance Use Topics  . Smoking status: Current Every Day Smoker -- 0.25 packs/day    Types: Cigarettes  . Smokeless tobacco: Not on file  . Alcohol Use: Yes     Comment: Occasional     Allergies: No Known Allergies  Prescriptions prior to admission  Medication Sig Dispense Refill  . albuterol (PROVENTIL HFA;VENTOLIN HFA) 108 (90 BASE) MCG/ACT inhaler Inhale 2 puffs into the lungs every 6 (six) hours as needed for wheezing or shortness of breath.       . Ketotifen Fumarate (ALLERGY EYE DROPS OP) Apply 2 drops to eye 2 (two) times daily as needed (for itching/allergies).        Review of Systems  Constitutional: Positive for malaise/fatigue. Negative for fever and chills.  Gastrointestinal: Positive for abdominal pain. Negative for nausea, vomiting, diarrhea and constipation.  Neurological: Positive for weakness.   Physical Exam   Blood pressure 110/70, pulse 90, temperature 98.1 F (36.7 C), resp. rate 18, height 5\' 4"  (1.626 m), weight 101.061 kg (222 lb 12.8  oz), last menstrual period 04/07/2013, SpO2 98.00%.  Physical Exam  Nursing note and vitals reviewed. Constitutional: She is oriented to person, place, and time. She appears well-developed and well-nourished. No distress.  HENT:  Head: Normocephalic.  Eyes: Pupils are equal, round, and reactive to light.  Neck: Normal range of motion. Neck supple.  Cardiovascular: Normal rate.   Respiratory: Effort normal.  GI: Soft.  Musculoskeletal: Normal range of motion.  Neurological: She is alert and oriented to person, place, and time.  Skin: Skin is warm and dry.  Psychiatric: She has a normal mood and affect.    MAU Course  Procedures Results for orders placed during the hospital encounter of 04/08/13 (from the past 24 hour(s))  CBC     Status: Abnormal   Collection Time    04/08/13 12:45 PM      Result Value Ref Range   WBC 11.9 (*) 4.0 - 10.5 K/uL   RBC 4.65  3.87 - 5.11 MIL/uL   Hemoglobin 9.7 (*) 12.0 - 15.0 g/dL   HCT 91.432.4 (*) 78.236.0 - 95.646.0 %   MCV 69.7 (*) 78.0 - 100.0 fL   MCH 20.9 (*) 26.0 - 34.0 pg   MCHC 29.9 (*) 30.0 - 36.0 g/dL   RDW 21.319.5 (*) 08.611.5 - 57.815.5 %   Platelets  467 (*) 150 - 400 K/uL   Went to check on pt and pt was asleep with partner in the bed- did not arouse when I went in room US Transvaginal Non-ob  04/08/2013   CLINICAL DATA:  Heavy bleeding, irregular menses.  EXAM: TRANSABDOMINAL AND TRANSVAGINAL ULTRASOUND OF PELVIS  TECHNIQUE: Both transabdominal and transvaginal ultrasound examinations of the pelvis were performed. Transabdominal technique was performed for global imaging of the pelvis including uterus, ovaries, adnexal regions, and pelvic cul-de-sac. It was necessary to proceed with endovaginal exam following the transabdominal exam to visualize the uterus, endometrium, ovaries and adnexa .  COMPARISON:  None  FINDINGS: Uterus  Measurements: Uterus is retroverted, measuring 7.2 x 4.3 x 5.3 cm. No fibroids or other mass visualized.  Endometrium  Thickness: 5  mm.  No focal abnormality visualized.  Right ovary  Measurements: 3.3 x 2.8 x 2.1 cm. Normal appearance/no adnexal mass.  Left ovary  Measurements: 3.2 x 2.2 x 2.8 cm. Normal appearance/no adnexal mass.  Other findings  No free fluid.  IMPRESSION: Unremarkable study. If bleeding remains unresponsive to hormonal or medical therapy, sonohysterogram should be considered for focal lesion work-up. (Ref: Radiological Reasoning: Algorithmic Workup of Abnormal Vaginal Bleeding with Endovaginal Sonography and Sonohysterography. AJR 2008; 161:W96-04)   Electronically Signed   By: Charlett Nose M.D.   On: 04/08/2013 15:46   US Pelvis Complete  04/08/2013   CLINICAL DATA:  Heavy bleeding, irregular menses.  EXAM: TRANSABDOMINAL AND TRANSVAGINAL ULTRASOUND OF PELVIS  TECHNIQUE: Both transabdominal and transvaginal ultrasound examinations of the pelvis were performed. Transabdominal technique was performed for global imaging of the pelvis including uterus, ovaries, adnexal regions, and pelvic cul-de-sac. It was necessary to proceed with endovaginal exam following the transabdominal exam to visualize the uterus, endometrium, ovaries and adnexa .  COMPARISON:  None  FINDINGS: Uterus  Measurements: Uterus is retroverted, measuring 7.2 x 4.3 x 5.3 cm. No fibroids or other mass visualized.  Endometrium  Thickness: 5 mm.  No focal abnormality visualized.  Right ovary  Measurements: 3.3 x 2.8 x 2.1 cm. Normal appearance/no adnexal mass.  Left ovary  Measurements: 3.2 x 2.2 x 2.8 cm. Normal appearance/no adnexal mass.  Other findings  No free fluid.  IMPRESSION: Unremarkable study. If bleeding remains unresponsive to hormonal or medical therapy, sonohysterogram should be considered for focal lesion work-up. (Ref: Radiological Reasoning: Algorithmic Workup of Abnormal Vaginal Bleeding with Endovaginal Sonography and Sonohysterography. AJR 2008; 540:J81-19)   Electronically Signed   By: Charlett Nose M.D.   On: 04/08/2013 15:46    Assessment and Plan  Abnormal uterine bleeding Sprintec 3 pills for 3 days then 2 pills for 2 days then 1 pill daily until GYN appointment F/u in GYN clinic- will have clinic call pt for an appointment  Dontreal Miera 04/08/2013, 1:35 PM

## 2013-04-08 NOTE — MAU Note (Signed)
Pt of [facility], received phone call yesterday telling her that her blood count was low.  Pt states very weak, started bleeding last night.

## 2013-04-08 NOTE — MAU Note (Signed)
Hx of irregular periods.

## 2013-04-09 NOTE — MAU Provider Note (Signed)
Attestation of Attending Supervision of Advanced Practitioner (PA/CNM/NP): Evaluation and management procedures were performed by the Advanced Practitioner under my supervision and collaboration.  I have reviewed the Advanced Practitioner's note and chart, and I agree with the management and plan.  Jacob Stinson, DO Attending Physician Faculty Practice, [facility]  

## 2013-04-13 ENCOUNTER — Emergency Department (HOSPITAL_COMMUNITY)
Admission: EM | Admit: 2013-04-13 | Discharge: 2013-04-13 | Disposition: A | Discharge status: Home / Self Care | Payer: Medicaid Other | Attending: Emergency Medicine | Admitting: Emergency Medicine

## 2013-04-13 ENCOUNTER — Encounter (HOSPITAL_COMMUNITY): Payer: Self-pay | Admitting: Emergency Medicine

## 2013-04-13 DIAGNOSIS — R0981 Nasal congestion: Secondary | ICD-10-CM

## 2013-04-13 DIAGNOSIS — J309 Allergic rhinitis, unspecified: Secondary | ICD-10-CM | POA: Insufficient documentation

## 2013-04-13 DIAGNOSIS — Z79899 Other long term (current) drug therapy: Secondary | ICD-10-CM | POA: Insufficient documentation

## 2013-04-13 DIAGNOSIS — J302 Other seasonal allergic rhinitis: Secondary | ICD-10-CM

## 2013-04-13 DIAGNOSIS — J45909 Unspecified asthma, uncomplicated: Secondary | ICD-10-CM | POA: Insufficient documentation

## 2013-04-13 DIAGNOSIS — E669 Obesity, unspecified: Secondary | ICD-10-CM | POA: Insufficient documentation

## 2013-04-13 DIAGNOSIS — F17200 Nicotine dependence, unspecified, uncomplicated: Secondary | ICD-10-CM | POA: Insufficient documentation

## 2013-04-13 MED ORDER — GUAIFENESIN ER 600 MG PO TB12: 600.0000 mg | ORAL_TABLET | Freq: Two times a day (BID) | ORAL | Status: DC

## 2013-04-13 MED ORDER — CETIRIZINE-PSEUDOEPHEDRINE ER 5-120 MG PO TB12: 1.0000 | ORAL_TABLET | Freq: Two times a day (BID) | ORAL | Status: DC

## 2013-04-13 MED ORDER — FLUTICASONE PROPIONATE 50 MCG/ACT NA SUSP: 2.0000 | Freq: Every day | NASAL | Status: DC

## 2013-04-13 NOTE — ED Provider Notes (Signed)
CSN: [id number]     Arrival date & time 04/13/13  0255 History   First MD Initiated Contact with Patient 04/13/13 0329     Chief Complaint  Patient presents with  . Nasal Congestion   HPI  History provided by the patient. Patient is a 23 year old female with history of asthma presenting with worsening allergy and congestion symptoms. Patient reports having running nose with increased sneezing and irritation for the past 3 days. She states that she has had more thickening and congestive symptoms all day yesterday. Reports that she has had alternating problems breathing out of her nostrils. She has been taking Allegra as well as using her albuterol inhaler without any improvement of her symptoms. She does report associated coughing occasionally. Denies any fever, chills or sweats. No other aggravating or alleviating factors. No other associated symptoms.     Past Medical History  Diagnosis Date  . Asthma   . Obesity    Past Surgical History  Procedure Laterality Date  . Wisdom tooth extraction     Family History  Problem Relation Age of Onset  . Hypertension Mother   . Sickle cell anemia Mother   . Diabetes Father   . Hypertension Father   . Diabetes Maternal Grandmother   . Diabetes Paternal Grandfather    History  Substance Use Topics  . Smoking status: Current Every Day Smoker -- 0.25 packs/day    Types: Cigarettes  . Smokeless tobacco: Not on file  . Alcohol Use: Yes     Comment: Occasional    OB History   Grav Para Term Preterm Abortions TAB SAB Ect Mult Living   0              Review of Systems  Constitutional: Negative for fever, chills, diaphoresis and appetite change.  HENT: Positive for congestion, rhinorrhea and sinus pressure.   Respiratory: Positive for cough.   Gastrointestinal: Negative for vomiting and diarrhea.  All other systems reviewed and are negative.     Allergies  Review of patient's allergies indicates no known allergies.  Home  Medications   Current Outpatient Rx  Name  Route  Sig  Dispense  Refill  . albuterol (PROVENTIL HFA;VENTOLIN HFA) 108 (90 BASE) MCG/ACT inhaler   Inhalation   Inhale 2 puffs into the lungs every 6 (six) hours as needed for wheezing or shortness of breath.          . ferrous sulfate 325 (65 FE) MG tablet   Oral   Take 1 tablet (325 mg total) by mouth 2 (two) times daily with a meal.   60 tablet   0   . fexofenadine (ALLEGRA) 60 MG tablet   Oral   Take 1 tablet (60 mg total) by mouth 2 (two) times daily.   60 tablet   0   . ibuprofen (ADVIL,MOTRIN) 600 MG tablet   Oral   Take 1 tablet (600 mg total) by mouth every 6 (six) hours as needed.   30 tablet   0   . norgestimate-ethinyl estradiol (ORTHO-CYCLEN,SPRINTEC,PREVIFEM) 0.25-35 MG-MCG tablet   Oral   Take 1 tablet by mouth daily. 3pills qd x 3 d then 2 pills qd x 2 days the one daily until appointment   2 Package   11    BP 125/73  Pulse 102  Temp(Src) 98.6 F (37 C) (Oral)  Resp 20  Ht 5\' 4"  (1.626 m)  Wt 222 lb (100.699 kg)  BMI 38.09 kg/m2  SpO2 99%  LMP  04/07/2013 Physical Exam  Nursing note and vitals reviewed. Constitutional: She is oriented to person, place, and time. She appears well-developed and well-nourished. No distress.  HENT:  Head: Normocephalic.  Right Ear: Tympanic membrane normal.  Left Ear: Tympanic membrane normal.  Mouth/Throat: Oropharynx is clear and moist.  Nasal mucosal edema of the left nostril with poor air movement. Some discharge present bilaterally.  Neck: Normal range of motion. Neck supple.  No meningeal signs  Cardiovascular: Normal rate and regular rhythm.   Pulmonary/Chest: Effort normal and breath sounds normal. No respiratory distress. She has no wheezes. She has no rales.  Abdominal: Soft.  Neurological: She is alert and oriented to person, place, and time.  Skin: Skin is warm and dry. No rash noted.  Psychiatric: She has a normal mood and affect. Her behavior is  normal.    ED Course  Procedures    COORDINATION OF CARE:  Nursing notes reviewed. Vital signs reviewed. Initial pt interview and examination performed.   Filed Vitals:   04/13/13 0301  BP: 125/73  Pulse: 102  Temp: 98.6 F (37 C)  TempSrc: Oral  Resp: 20  Height: 5\' 4"  (1.626 m)  Weight: 222 lb (100.699 kg)  SpO2: 99%    4:24 AM-patient seen and evaluated. Patient well-appearing no acute distress. Afebrile. Does not appear severely ill toxic. Symptoms for the past 3 days. At this time will recommend additional treatment for her allergy and congestion symptoms.     MDM   Final diagnoses:  Seasonal allergies  Nasal congestion        Angus Sellereter S Fumiye Lubben, PA-C 04/13/13 [phone]

## 2013-04-13 NOTE — Discharge Instructions (Signed)
Please use the medications prescribed to help with your allergy and congestion symptoms. Followup with a primary care provider for continued evaluation and treatment.    Allergic Rhinitis Allergic rhinitis is when the mucous membranes in the nose respond to allergens. Allergens are particles in the air that cause your body to have an allergic reaction. This causes you to release allergic antibodies. Through a chain of events, these eventually cause you to release histamine into the blood stream. Although meant to protect the body, it is this release of histamine that causes your discomfort, such as frequent sneezing, congestion, and an itchy, runny nose.  CAUSES  Seasonal allergic rhinitis (hay fever) is caused by pollen allergens that may come from grasses, trees, and weeds. Year-round allergic rhinitis (perennial allergic rhinitis) is caused by allergens such as house dust mites, pet dander, and mold spores.  SYMPTOMS   Nasal stuffiness (congestion).  Itchy, runny nose with sneezing and tearing of the eyes. DIAGNOSIS  Your health care provider can help you determine the allergen or allergens that trigger your symptoms. If you and your health care provider are unable to determine the allergen, skin or blood testing may be used. TREATMENT  Allergic Rhinitis does not have a cure, but it can be controlled by:  Medicines and allergy shots (immunotherapy).  Avoiding the allergen. Hay fever may often be treated with antihistamines in pill or nasal spray forms. Antihistamines block the effects of histamine. There are over-the-counter medicines that may help with nasal congestion and swelling around the eyes. Check with your health care provider before taking or giving this medicine.  If avoiding the allergen or the medicine prescribed do not work, there are many new medicines your health care provider can prescribe. Stronger medicine may be used if initial measures are ineffective. Desensitizing  injections can be used if medicine and avoidance does not work. Desensitization is when a patient is given ongoing shots until the body becomes less sensitive to the allergen. Make sure you follow up with your health care provider if problems continue. HOME CARE INSTRUCTIONS It is not possible to completely avoid allergens, but you can reduce your symptoms by taking steps to limit your exposure to them. It helps to know exactly what you are allergic to so that you can avoid your specific triggers. SEEK MEDICAL CARE IF:   You have a fever.  You develop a cough that does not stop easily (persistent).  You have shortness of breath.  You start wheezing.  Symptoms interfere with normal daily activities. Document Released: 09/13/2000 Document Revised: 10/09/2012 Document Reviewed: 08/26/2012 [facility] Patient Information 2014 [employer] [place].

## 2013-04-13 NOTE — ED Notes (Signed)
Bed: WLPT1 Expected date: 04/13/13 Expected time: 2:44 AM Means of arrival: Ambulance Comments: Allergy symptoms

## 2013-04-13 NOTE — ED Provider Notes (Signed)
Medical screening examination/treatment/procedure(s) were performed by non-physician practitioner and as supervising physician I was immediately available for consultation/collaboration.   EKG Interpretation None       Darius Fillingim M Pinky Ravan, MD 04/13/13 0621 

## 2013-04-13 NOTE — ED Notes (Signed)
Pt complains of allergy type sx for three days, no relief with OTC medications, tonight she feels like she can't breathe well

## 2013-04-22 ENCOUNTER — Emergency Department (HOSPITAL_COMMUNITY)
Admission: EM | Admit: 2013-04-22 | Discharge: 2013-04-22 | Disposition: A | Discharge status: Home / Self Care | Payer: Medicaid Other | Attending: Emergency Medicine | Admitting: Emergency Medicine

## 2013-04-22 ENCOUNTER — Emergency Department (HOSPITAL_COMMUNITY): Payer: Medicaid Other

## 2013-04-22 ENCOUNTER — Encounter (HOSPITAL_COMMUNITY): Payer: Self-pay | Admitting: Emergency Medicine

## 2013-04-22 DIAGNOSIS — R1084 Generalized abdominal pain: Secondary | ICD-10-CM | POA: Insufficient documentation

## 2013-04-22 DIAGNOSIS — Z3202 Encounter for pregnancy test, result negative: Secondary | ICD-10-CM | POA: Insufficient documentation

## 2013-04-22 DIAGNOSIS — R142 Eructation: Secondary | ICD-10-CM

## 2013-04-22 DIAGNOSIS — Z79899 Other long term (current) drug therapy: Secondary | ICD-10-CM | POA: Insufficient documentation

## 2013-04-22 DIAGNOSIS — E669 Obesity, unspecified: Secondary | ICD-10-CM | POA: Insufficient documentation

## 2013-04-22 DIAGNOSIS — F17200 Nicotine dependence, unspecified, uncomplicated: Secondary | ICD-10-CM | POA: Insufficient documentation

## 2013-04-22 DIAGNOSIS — M549 Dorsalgia, unspecified: Secondary | ICD-10-CM | POA: Insufficient documentation

## 2013-04-22 DIAGNOSIS — R143 Flatulence: Secondary | ICD-10-CM

## 2013-04-22 DIAGNOSIS — J45909 Unspecified asthma, uncomplicated: Secondary | ICD-10-CM | POA: Insufficient documentation

## 2013-04-22 DIAGNOSIS — IMO0002 Reserved for concepts with insufficient information to code with codable children: Secondary | ICD-10-CM | POA: Insufficient documentation

## 2013-04-22 DIAGNOSIS — R079 Chest pain, unspecified: Secondary | ICD-10-CM | POA: Insufficient documentation

## 2013-04-22 DIAGNOSIS — R109 Unspecified abdominal pain: Secondary | ICD-10-CM

## 2013-04-22 DIAGNOSIS — R141 Gas pain: Secondary | ICD-10-CM | POA: Insufficient documentation

## 2013-04-22 LAB — COMPREHENSIVE METABOLIC PANEL
ALBUMIN: 3.2 g/dL — AB (ref 3.5–5.2)
ALT: 24 U/L (ref 0–35)
AST: 16 U/L (ref 0–37)
Alkaline Phosphatase: 79 U/L (ref 39–117)
BUN: 15 mg/dL (ref 6–23)
CHLORIDE: 105 meq/L (ref 96–112)
CO2: 24 mEq/L (ref 19–32)
CREATININE: 0.71 mg/dL (ref 0.50–1.10)
Calcium: 9.4 mg/dL (ref 8.4–10.5)
GFR calc Af Amer: >90 mL/min (ref >90)
GFR calc non Af Amer: >90 mL/min (ref >90)
Glucose, Bld: 143 mg/dL — ABNORMAL HIGH (ref 70–99)
POTASSIUM: 4 meq/L (ref 3.7–5.3)
Sodium: 142 mEq/L (ref 137–147)
TOTAL PROTEIN: 6.4 g/dL (ref 6.0–8.3)

## 2013-04-22 LAB — URINALYSIS, ROUTINE W REFLEX MICROSCOPIC
Bilirubin Urine: NEGATIVE
Glucose, UA: NEGATIVE mg/dL
Ketones, ur: NEGATIVE mg/dL
Leukocytes, UA: NEGATIVE
NITRITE: NEGATIVE
PROTEIN: NEGATIVE mg/dL
SPECIFIC GRAVITY, URINE: 1.024 (ref 1.005–1.030)
UROBILINOGEN UA: 1 mg/dL (ref 0.0–1.0)
pH: 6 (ref 5.0–8.0)

## 2013-04-22 LAB — CBC WITH DIFFERENTIAL/PLATELET
BASOS ABS: 0 10*3/uL (ref 0.0–0.1)
Basophils Relative: 0 % (ref 0–1)
Eosinophils Absolute: 0.4 10*3/uL (ref 0.0–0.7)
Eosinophils Relative: 4 % (ref 0–5)
HCT: 33.6 % — ABNORMAL LOW (ref 36.0–46.0)
Hemoglobin: 10.3 g/dL — ABNORMAL LOW (ref 12.0–15.0)
LYMPHS ABS: 4.5 10*3/uL — AB (ref 0.7–4.0)
LYMPHS PCT: 43 % (ref 12–46)
MCH: 22 pg — ABNORMAL LOW (ref 26.0–34.0)
MCHC: 30.7 g/dL (ref 30.0–36.0)
MCV: 71.6 fL — ABNORMAL LOW (ref 78.0–100.0)
MONOS PCT: 4 % (ref 3–12)
Monocytes Absolute: 0.4 10*3/uL (ref 0.1–1.0)
Neutro Abs: 5.1 10*3/uL (ref 1.7–7.7)
Neutrophils Relative %: 49 % (ref 43–77)
PLATELETS: 366 10*3/uL (ref 150–400)
RBC: 4.69 MIL/uL (ref 3.87–5.11)
RDW: 23.9 % — ABNORMAL HIGH (ref 11.5–15.5)
WBC: 10.4 10*3/uL (ref 4.0–10.5)

## 2013-04-22 LAB — URINE MICROSCOPIC-ADD ON

## 2013-04-22 LAB — POC URINE PREG, ED: PREG TEST UR: NEGATIVE

## 2013-04-22 LAB — LIPASE, BLOOD: LIPASE: 44 U/L (ref 11–59)

## 2013-04-22 MED ORDER — IBUPROFEN 600 MG PO TABS: 600.0000 mg | ORAL_TABLET | Freq: Four times a day (QID) | ORAL | Status: DC | PRN

## 2013-04-22 MED ORDER — SIMETHICONE 80 MG PO CHEW: 80.0000 mg | CHEWABLE_TABLET | Freq: Four times a day (QID) | ORAL | Status: DC | PRN

## 2013-04-22 NOTE — ED Notes (Signed)
Bed: NW29WA24 Expected date:  Expected time:  Means of arrival:  Comments: EMS/abdominal pain-blood in stool

## 2013-04-22 NOTE — ED Provider Notes (Signed)
CSN: [id number]     Arrival date & time 04/22/13  0213 History   First MD Initiated Contact with Patient 04/22/13 0257     Chief Complaint  Patient presents with  . Abdominal Pain  . Back Pain     (Consider location/radiation/quality/duration/timing/severity/associated sxs/prior Treatment) HPI Comments: 23 Y/O obese female comes in with cc of abd pain. Pt states that for the past 2-3 days she has been having chest pain, abd pain and back pain. Her pain is pressure like, and she is feeling bloated. Pain is constant, no aggravating or relieving factors. She has no cough, no dib, no numbness, tingling, nausea, emesis, and BM is loose, ? Bright blood in it, no melena. No vaginal discharge, bleeding, LMP was 2 months ago. No hx of STD, and patient is a monogamous relationship.  Patient is a 23 y.o. female presenting with abdominal pain and back pain. The history is provided by the patient.  Abdominal Pain Associated symptoms: chest pain   Associated symptoms: no dysuria, no nausea, no shortness of breath and no vomiting   Back Pain Associated symptoms: abdominal pain and chest pain   Associated symptoms: no dysuria and no headaches     Past Medical History  Diagnosis Date  . Asthma   . Obesity    Past Surgical History  Procedure Laterality Date  . Wisdom tooth extraction     Family History  Problem Relation Age of Onset  . Hypertension Mother   . Sickle cell anemia Mother   . Diabetes Father   . Hypertension Father   . Diabetes Maternal Grandmother   . Diabetes Paternal Grandfather    History  Substance Use Topics  . Smoking status: Current Every Day Smoker -- 0.25 packs/day    Types: Cigarettes  . Smokeless tobacco: Not on file  . Alcohol Use: Yes     Comment: Occasional    OB History   Grav Para Term Preterm Abortions TAB SAB Ect Mult Living   0              Review of Systems  Constitutional: Negative for activity change.  Respiratory: Negative for shortness of  breath.   Cardiovascular: Positive for chest pain. Negative for palpitations and leg swelling.  Gastrointestinal: Positive for abdominal pain. Negative for nausea and vomiting.  Genitourinary: Negative for dysuria.  Musculoskeletal: Positive for back pain. Negative for neck pain.  Neurological: Negative for headaches.  All other systems reviewed and are negative.     Allergies  Review of patient's allergies indicates no known allergies.  Home Medications   Prior to Admission medications   Medication Sig Start Date End Date Taking? Authorizing Provider  albuterol (PROVENTIL HFA;VENTOLIN HFA) 108 (90 BASE) MCG/ACT inhaler Inhale 2 puffs into the lungs every 6 (six) hours as needed for wheezing or shortness of breath.    Yes Historical Provider, MD  ferrous sulfate 325 (65 FE) MG tablet Take 1 tablet (325 mg total) by mouth 2 (two) times daily with a meal. 04/08/13  Yes Jean RosenthalSusan P Lineberry, NP  fluticasone (FLONASE) 50 MCG/ACT nasal spray Place 2 sprays into both nostrils daily as needed for allergies or rhinitis.   Yes Historical Provider, MD  guaiFENesin (MUCINEX) 600 MG 12 hr tablet Take 1 tablet (600 mg total) by mouth 2 (two) times daily. 04/13/13  Yes Peter S Dammen, PA-C  naproxen sodium (ANAPROX) 220 MG tablet Take 440 mg by mouth 2 (two) times daily as needed (pain).   Yes Historical  Provider, MD  norgestimate-ethinyl estradiol (ORTHO-CYCLEN,SPRINTEC,PREVIFEM) 0.25-35 MG-MCG tablet Take 1 tablet by mouth daily. 3pills qd x 3 d then 2 pills qd x 2 days the one daily until appointment 04/08/13  Yes Jean RosenthalSusan P Lineberry, NP  tetrahydrozoline 0.05 % ophthalmic solution Place 1 drop into both eyes 2 (two) times daily as needed (dry eyes).   Yes Historical Provider, MD   BP 118/70  Pulse 87  Temp(Src) 98.9 F (37.2 C) (Oral)  Resp 14  SpO2 97%  LMP 04/07/2013 Physical Exam  Nursing note and vitals reviewed. Constitutional: She is oriented to person, place, and time. She appears  well-developed and well-nourished.  HENT:  Head: Normocephalic and atraumatic.  Eyes: EOM are normal. Pupils are equal, round, and reactive to light.  Neck: Neck supple.  Cardiovascular: Normal rate, regular rhythm and normal heart sounds.   No murmur heard. Pulmonary/Chest: Effort normal. No respiratory distress.  Abdominal: Soft. Bowel sounds are normal. She exhibits no distension. There is tenderness. There is no rebound and no guarding.  Diffuse tenderness, with no rebound, guarding. + bilateral flank tenderness  Musculoskeletal:  No midline spine tenderness  Neurological: She is alert and oriented to person, place, and time.  Skin: Skin is warm and dry.    ED Course  Procedures (including critical care time) Labs Review Labs Reviewed  URINALYSIS, ROUTINE W REFLEX MICROSCOPIC  CBC WITH DIFFERENTIAL  COMPREHENSIVE METABOLIC PANEL  LIPASE, BLOOD  POC URINE PREG, ED    Imaging Review No results found.   EKG Interpretation None      MDM   Final diagnoses:  Abdominal pain    Pt comes in with cc of abd pain, chest pain, back pain. Also some loose BM. Has bloating as well. All the labs are normal, she has 0 SIRs criteria, abd exam reveals diffuse tenderness, but it was minimal - and there was no peritoneal irritation at all. Unsure what is causing this constellation of sx. Will give gas meds, and pain meds.    Derwood KaplanAnkit Leomar Westberg, MD 04/22/13 [phone]

## 2013-04-22 NOTE — ED Notes (Signed)
Pt aware we need urine sample, states she does not want catheter, told pt has a 30 minute window to give us sample or she will have to have catheter done.

## 2013-04-22 NOTE — ED Notes (Signed)
Pt states the past 2-3 days she's had abdominal pain that radiates to her lower back then it radiates up in her chest, pt states she's been nauseous and had diarrhea but denies vomiting, also states having urinary burning and frequency.

## 2013-04-22 NOTE — ED Notes (Signed)
Patient transported to X-ray 

## 2013-04-22 NOTE — Discharge Instructions (Signed)
Abdominal Pain, Women °Abdominal (stomach, pelvic, or belly) pain can be caused by many things. It is important to tell your doctor: °· The location of the pain. °· Does it come and go or is it present all the time? °· Are there things that start the pain (eating certain foods, exercise)? °· Are there other symptoms associated with the pain (fever, nausea, vomiting, diarrhea)? °All of this is helpful to know when trying to find the cause of the pain. °CAUSES  °· Stomach: virus or bacteria infection, or ulcer. °· Intestine: appendicitis (inflamed appendix), regional ileitis (Crohn's disease), ulcerative colitis (inflamed colon), irritable bowel syndrome, diverticulitis (inflamed diverticulum of the colon), or cancer of the stomach or intestine. °· Gallbladder disease or stones in the gallbladder. °· Kidney disease, kidney stones, or infection. °· Pancreas infection or cancer. °· Fibromyalgia (pain disorder). °· Diseases of the female organs: °· Uterus: fibroid (non-cancerous) tumors or infection. °· Fallopian tubes: infection or tubal pregnancy. °· Ovary: cysts or tumors. °· Pelvic adhesions (scar tissue). °· Endometriosis (uterus lining tissue growing in the pelvis and on the pelvic organs). °· Pelvic congestion syndrome (female organs filling up with blood just before the menstrual period). °· Pain with the menstrual period. °· Pain with ovulation (producing an egg). °· Pain with an IUD (intrauterine device, birth control) in the uterus. °· Cancer of the female organs. °· Functional pain (pain not caused by a disease, may improve without treatment). °· Psychological pain. °· Depression. °DIAGNOSIS  °Your doctor will decide the seriousness of your pain by doing an examination. °· Blood tests. °· X-rays. °· Ultrasound. °· CT scan (computed tomography, special type of X-ray). °· MRI (magnetic resonance imaging). °· Cultures, for infection. °· Barium enema (dye inserted in the large intestine, to better view it with  X-rays). °· Colonoscopy (looking in intestine with a lighted tube). °· Laparoscopy (minor surgery, looking in abdomen with a lighted tube). °· Major abdominal exploratory surgery (looking in abdomen with a large incision). °TREATMENT  °The treatment will depend on the cause of the pain.  °· Many cases can be observed and treated at home. °· Over-the-counter medicines recommended by your caregiver. °· Prescription medicine. °· Antibiotics, for infection. °· Birth control pills, for painful periods or for ovulation pain. °· Hormone treatment, for endometriosis. °· Nerve blocking injections. °· Physical therapy. °· Antidepressants. °· Counseling with a psychologist or psychiatrist. °· Minor or major surgery. °HOME CARE INSTRUCTIONS  °· Do not take laxatives, unless directed by your caregiver. °· Take over-the-counter pain medicine only if ordered by your caregiver. Do not take aspirin because it can cause an upset stomach or bleeding. °· Try a clear liquid diet (broth or water) as ordered by your caregiver. Slowly move to a bland diet, as tolerated, if the pain is related to the stomach or intestine. °· Have a thermometer and take your temperature several times a day, and record it. °· Bed rest and sleep, if it helps the pain. °· Avoid sexual intercourse, if it causes pain. °· Avoid stressful situations. °· Keep your follow-up appointments and tests, as your caregiver orders. °· If the pain does not go away with medicine or surgery, you may try: °· Acupuncture. °· Relaxation exercises (yoga, meditation). °· Group therapy. °· Counseling. °SEEK MEDICAL CARE IF:  °· You notice certain foods cause stomach pain. °· Your home care treatment is not helping your pain. °· You need stronger pain medicine. °· You want your IUD removed. °· You feel faint or   lightheaded. °· You develop nausea and vomiting. °· You develop a rash. °· You are having side effects or an allergy to your medicine. °SEEK IMMEDIATE MEDICAL CARE IF:  °· Your  pain does not go away or gets worse. °· You have a fever. °· Your pain is felt only in portions of the abdomen. The right side could possibly be appendicitis. The left lower portion of the abdomen could be colitis or diverticulitis. °· You are passing blood in your stools (bright red or black tarry stools, with or without vomiting). °· You have blood in your urine. °· You develop chills, with or without a fever. °· You pass out. °MAKE SURE YOU:  °· Understand these instructions. °· Will watch your condition. °· Will get help right away if you are not doing well or get worse. °Document Released: 10/16/2006 Document Revised: 03/13/2011 Document Reviewed: 11/05/2008 °ExitCare® Patient Information ©2014 [employer]. ° °

## 2013-04-22 NOTE — ED Notes (Signed)
Per EMS pt is having abdominal pain, chest pain and back pain x 3 days, pressure in stomach, blood in stool and urine, states she's having diarrhea but is constipated. Denies vomiting. Last menstrual cycle was 2 months ago but does not think she is pregnant but is possible. BP 130/80, HR 84

## 2013-04-22 NOTE — ED Notes (Signed)
Departure condition put in on wrong pt, does not apply to this pt.

## 2013-04-29 ENCOUNTER — Ambulatory Visit: Payer: Medicaid Other | Admitting: Advanced Practice Midwife

## 2013-06-04 ENCOUNTER — Encounter: Payer: Medicaid Other | Admitting: Obstetrics and Gynecology

## 2013-06-04 ENCOUNTER — Telehealth: Payer: Self-pay | Admitting: *Deleted

## 2013-06-04 ENCOUNTER — Encounter: Payer: Self-pay | Admitting: *Deleted

## 2013-06-04 NOTE — Telephone Encounter (Signed)
Attempted to call patient to inform of missed appointment, number is out of service.  Will send letter.\\Letter sent.

## 2013-07-25 ENCOUNTER — Encounter: Payer: Self-pay | Admitting: General Practice

## 2014-07-13 ENCOUNTER — Encounter (HOSPITAL_COMMUNITY): Payer: Self-pay | Admitting: *Deleted

## 2014-07-13 ENCOUNTER — Emergency Department (HOSPITAL_COMMUNITY)
Admission: EM | Admit: 2014-07-13 | Discharge: 2014-07-13 | Disposition: A | Discharge status: Home / Self Care | Payer: Medicaid Other | Attending: Emergency Medicine | Admitting: Emergency Medicine

## 2014-07-13 DIAGNOSIS — E669 Obesity, unspecified: Secondary | ICD-10-CM | POA: Insufficient documentation

## 2014-07-13 DIAGNOSIS — Z793 Long term (current) use of hormonal contraceptives: Secondary | ICD-10-CM | POA: Insufficient documentation

## 2014-07-13 DIAGNOSIS — Z113 Encounter for screening for infections with a predominantly sexual mode of transmission: Secondary | ICD-10-CM | POA: Insufficient documentation

## 2014-07-13 DIAGNOSIS — Z720 Tobacco use: Secondary | ICD-10-CM | POA: Insufficient documentation

## 2014-07-13 DIAGNOSIS — J45909 Unspecified asthma, uncomplicated: Secondary | ICD-10-CM | POA: Insufficient documentation

## 2014-07-13 DIAGNOSIS — Z79899 Other long term (current) drug therapy: Secondary | ICD-10-CM | POA: Insufficient documentation

## 2014-07-13 LAB — URINALYSIS, ROUTINE W REFLEX MICROSCOPIC
BILIRUBIN URINE: NEGATIVE
Glucose, UA: NEGATIVE mg/dL
Ketones, ur: NEGATIVE mg/dL
NITRITE: NEGATIVE
Protein, ur: NEGATIVE mg/dL
Specific Gravity, Urine: 1.021 (ref 1.005–1.030)
Urobilinogen, UA: 1 mg/dL (ref 0.0–1.0)
pH: 7 (ref 5.0–8.0)

## 2014-07-13 LAB — URINE MICROSCOPIC-ADD ON

## 2014-07-13 LAB — WET PREP, GENITAL
CLUE CELLS WET PREP: NONE SEEN
Trich, Wet Prep: NONE SEEN
Yeast Wet Prep HPF POC: NONE SEEN

## 2014-07-13 LAB — POC URINE PREG, ED: PREG TEST UR: NEGATIVE

## 2014-07-13 LAB — HIV ANTIBODY (ROUTINE TESTING W REFLEX): HIV Screen 4th Generation wRfx: NONREACTIVE

## 2014-07-13 NOTE — ED Notes (Signed)
Pt states she has had unprotected sex for several weeks with boyfriend. Notices bumps at vaginal opening for several weeks. requesting HIV test. No other complaints at this time.

## 2014-07-13 NOTE — ED Notes (Signed)
PELVIC SET UP READY AT BS

## 2014-07-13 NOTE — Discharge Instructions (Signed)
It was our pleasure to provide your ER care today.  For routine health care, and/or including routine STD checks, it may be more cost effective for you to go directly to a primary care doctor, or the health department.   We will notify you if results are positive.  Follow up with primary care doctor in the next couple weeks.   Return to ER if worse, new symptoms, medical emergency, other concern.    Safe Sex Safe sex is about reducing the risk of giving or getting a sexually transmitted disease (STD). STDs are spread through sexual contact involving the genitals, mouth, or rectum. Some STDs can be cured and others cannot. Safe sex can also prevent unintended pregnancies.  WHAT ARE SOME SAFE SEX PRACTICES?  Limit your sexual activity to only one partner who is having sex with only you.  Talk to your partner about his or her past partners, past STDs, and drug use.  Use a condom every time you have sexual intercourse. This includes vaginal, oral, and anal sexual activity. Both females and males should wear condoms during oral sex. Only use latex or polyurethane condoms and water-based lubricants. Using petroleum-based lubricants or oils to lubricate a condom will weaken the condom and increase the chance that it will break. The condom should be in place from the beginning to the end of sexual activity. Wearing a condom reduces, but does not completely eliminate, your risk of getting or giving an STD. STDs can be spread by contact with infected body fluids and skin.  Get vaccinated for hepatitis B and HPV.  Avoid alcohol and recreational drugs, which can affect your judgment. You may forget to use a condom or participate in high-risk sex.  For females, avoid douching after sexual intercourse. Douching can spread an infection farther into the reproductive tract.  Check your body for signs of sores, blisters, rashes, or unusual discharge. See your health care provider if you notice any of these  signs.  Avoid sexual contact if you have symptoms of an infection or are being treated for an STD. If you or your partner has herpes, avoid sexual contact when blisters are present. Use condoms at all other times.  If you are at risk of being infected with HIV, it is recommended that you take a prescription medicine daily to prevent HIV infection. This is called pre-exposure prophylaxis (PrEP). You are considered at risk if:  You are a man who has sex with other men (MSM).  You are a heterosexual man or woman who is sexually active with more than one partner.  You take drugs by injection.  You are sexually active with a partner who has HIV.  Talk with your health care provider about whether you are at high risk of being infected with HIV. If you choose to begin PrEP, you should first be tested for HIV. You should then be tested every 3 months for as long as you are taking PrEP.  See your health care provider for regular screenings, exams, and tests for other STDs. Before having sex with a new partner, each of you should be screened for STDs and should talk about the results with each other. WHAT ARE THE BENEFITS OF SAFE SEX?   There is less chance of getting or giving an STD.  You can prevent unwanted or unintended pregnancies.  By discussing safe sex concerns with your partner, you may increase feelings of intimacy, comfort, trust, and honesty between the two of you. Document Released:  01/27/2004 Document Revised: 05/05/2013 Document Reviewed: 06/12/2011 ExitCare Patient Information 2015 [place], [place]. This information is not intended to replace advice given to you by your health care provider. Make sure you discuss any questions you have with your health care provider.    Sexually Transmitted Disease A sexually transmitted disease (STD) is a disease or infection that may be passed (transmitted) from person to person, usually during sexual activity. This may happen by way of saliva,  semen, blood, vaginal mucus, or urine. Common STDs include:   Gonorrhea.   Chlamydia.   Syphilis.   HIV and AIDS.   Genital herpes.   Hepatitis B and C.   Trichomonas.   Human papillomavirus (HPV).   Pubic lice.   Scabies.  Mites.  Bacterial vaginosis. WHAT ARE CAUSES OF STDs? An STD may be caused by bacteria, a virus, or parasites. STDs are often transmitted during sexual activity if one person is infected. However, they may also be transmitted through nonsexual means. STDs may be transmitted after:   Sexual intercourse with an infected person.   Sharing sex toys with an infected person.   Sharing needles with an infected person or using unclean piercing or tattoo needles.  Having intimate contact with the genitals, mouth, or rectal areas of an infected person.   Exposure to infected fluids during birth. WHAT ARE THE SIGNS AND SYMPTOMS OF STDs? Different STDs have different symptoms. Some people may not have any symptoms. If symptoms are present, they may include:   Painful or bloody urination.   Pain in the pelvis, abdomen, vagina, anus, throat, or eyes.   A skin rash, itching, or irritation.  Growths, ulcerations, blisters, or sores in the genital and anal areas.  Abnormal vaginal discharge with or without bad odor.   Penile discharge in men.   Fever.   Pain or bleeding during sexual intercourse.   Swollen glands in the groin area.   Yellow skin and eyes (jaundice). This is seen with hepatitis.   Swollen testicles.  Infertility.  Sores and blisters in the mouth. HOW ARE STDs DIAGNOSED? To make a diagnosis, your health care provider may:   Take a medical history.   Perform a physical exam.   Take a sample of any discharge to examine.  Swab the throat, cervix, opening to the penis, rectum, or vagina for testing.  Test a sample of your first morning urine.   Perform blood tests.   Perform a Pap test, if this  applies.   Perform a colposcopy.   Perform a laparoscopy.  HOW ARE STDs TREATED? Treatment depends on the STD. Some STDs may be treated but not cured.   Chlamydia, gonorrhea, trichomonas, and syphilis can be cured with antibiotic medicine.   Genital herpes, hepatitis, and HIV can be treated, but not cured, with prescribed medicines. The medicines lessen symptoms.   Genital warts from HPV can be treated with medicine or by freezing, burning (electrocautery), or surgery. Warts may come back.   HPV cannot be cured with medicine or surgery. However, abnormal areas may be removed from the cervix, vagina, or vulva.   If your diagnosis is confirmed, your recent sexual partners need treatment. This is true even if they are symptom-free or have a negative culture or evaluation. They should not have sex until their health care providers say it is okay. HOW CAN I REDUCE MY RISK OF GETTING AN STD? Take these steps to reduce your risk of getting an STD:  Use latex condoms, dental dams,  and water-soluble lubricants during sexual activity. Do not use petroleum jelly or oils.  Avoid having multiple sex partners.  Do not have sex with someone who has other sex partners.  Do not have sex with anyone you do not know or who is at high risk for an STD.  Avoid risky sex practices that can break your skin.  Do not have sex if you have open sores on your mouth or skin.  Avoid drinking too much alcohol or taking illegal drugs. Alcohol and drugs can affect your judgment and put you in a vulnerable position.  Avoid engaging in oral and anal sex acts.  Get vaccinated for HPV and hepatitis. If you have not received these vaccines in the past, talk to your health care provider about whether one or both might be right for you.   If you are at risk of being infected with HIV, it is recommended that you take a prescription medicine daily to prevent HIV infection. This is called pre-exposure prophylaxis  (PrEP). You are considered at risk if:  You are a man who has sex with other men (MSM).  You are a heterosexual man or woman and are sexually active with more than one partner.  You take drugs by injection.  You are sexually active with a partner who has HIV.  Talk with your health care provider about whether you are at high risk of being infected with HIV. If you choose to begin PrEP, you should first be tested for HIV. You should then be tested every 3 months for as long as you are taking PrEP.  WHAT SHOULD I DO IF I THINK I HAVE AN STD?  See your health care provider.   Tell your sexual partner(s). They should be tested and treated for any STDs.  Do not have sex until your health care provider says it is okay. WHEN SHOULD I GET IMMEDIATE MEDICAL CARE? Contact your health care provider right away if:   You have severe abdominal pain.  You are a man and notice swelling or pain in your testicles.  You are a woman and notice swelling or pain in your vagina. Document Released: 03/11/2002 Document Revised: 12/24/2012 Document Reviewed: 07/09/2012 [facility] Patient Information 2015 [place]. This information is not intended to replace advice given to you by your health care provider. Make sure you discuss any questions you have with your health care provider.

## 2014-07-13 NOTE — ED Notes (Signed)
Pt states that she wants to be checked for  STD's and HIV; pt will not give any more information

## 2014-07-13 NOTE — ED Provider Notes (Signed)
CSN: [id number]     Arrival date & time 07/13/14  [id number] History   First MD Initiated Contact with Patient 07/13/14 [id number]     Chief Complaint  Patient presents with  . Exposure to STD     (Consider location/radiation/quality/duration/timing/severity/associated sxs/prior Treatment) Patient is a 24 y.o. female presenting with STD exposure. The history is provided by the patient.  Exposure to STD  Patient requests check for std and hiv.  Indicates has been having unprotected sex with boyfriend for several weeks. Denies known std exposure. States does not feel sick or ill. No fever or chills. No abd or pelvic pain. On period. No other abnormal vaginal bleeding or discharge. No dysuria. No rash, skin or genital lesions or rash.        Past Medical History  Diagnosis Date  . Asthma   . Obesity    Past Surgical History  Procedure Laterality Date  . Wisdom tooth extraction     Family History  Problem Relation Age of Onset  . Hypertension Mother   . Sickle cell anemia Mother   . Diabetes Father   . Hypertension Father   . Diabetes Maternal Grandmother   . Diabetes Paternal Grandfather    History  Substance Use Topics  . Smoking status: Current Every Day Smoker -- 1.00 packs/day    Types: Cigarettes  . Smokeless tobacco: Not on file  . Alcohol Use: Yes     Comment: Occasional    OB History    Gravida Para Term Preterm AB TAB SAB Ectopic Multiple Living   0              Review of Systems  Constitutional: Negative for fever and chills.  Genitourinary: Negative for dysuria, vaginal bleeding and vaginal discharge.  Musculoskeletal: Negative for myalgias and arthralgias.  Skin: Negative for rash.      Allergies  Review of patient's allergies indicates no known allergies.  Home Medications   Prior to Admission medications   Medication Sig Start Date End Date Taking? Authorizing Provider  albuterol (PROVENTIL HFA;VENTOLIN HFA) 108 (90 BASE) MCG/ACT inhaler Inhale 2  puffs into the lungs every 6 (six) hours as needed for wheezing or shortness of breath.    Yes Historical Provider, MD  ibuprofen (ADVIL,MOTRIN) 200 MG tablet Take 800 mg by mouth every 4 (four) hours as needed for fever, headache, mild pain, moderate pain or cramping.   Yes Historical Provider, MD  ferrous sulfate 325 (65 FE) MG tablet Take 1 tablet (325 mg total) by mouth 2 (two) times daily with a meal. Patient not taking: Reported on 07/13/2014 04/08/13   Jean RosenthalSusan P Lineberry, NP  guaiFENesin (MUCINEX) 600 MG 12 hr tablet Take 1 tablet (600 mg total) by mouth 2 (two) times daily. Patient not taking: Reported on 07/13/2014 04/13/13   Ivonne AndrewPeter Dammen, PA-C  ibuprofen (ADVIL,MOTRIN) 600 MG tablet Take 1 tablet (600 mg total) by mouth every 6 (six) hours as needed. Patient not taking: Reported on 07/13/2014 04/22/13   Derwood KaplanAnkit Nanavati, MD  norgestimate-ethinyl estradiol (ORTHO-CYCLEN,SPRINTEC,PREVIFEM) 0.25-35 MG-MCG tablet Take 1 tablet by mouth daily. 3pills qd x 3 d then 2 pills qd x 2 days the one daily until appointment Patient not taking: Reported on 07/13/2014 04/08/13   Jean RosenthalSusan P Lineberry, NP  simethicone (GAS-X) 80 MG chewable tablet Chew 1 tablet (80 mg total) by mouth every 6 (six) hours as needed for flatulence. Patient not taking: Reported on 07/13/2014 04/22/13   Derwood KaplanAnkit Nanavati, MD   BP 127/89  mmHg  Pulse 89  Temp(Src) 98.1 F (36.7 C) (Oral)  Resp 20  Ht  (1.626 m)  Wt 240 lb (108.863 kg)  BMI 41.18 kg/m2  SpO2 100%  LMP 07/12/2014 Physical Exam  Constitutional: She appears well-developed and well-nourished. No distress.  HENT:  Mouth/Throat: Oropharynx is clear and moist.  Eyes: Conjunctivae are normal. No scleral icterus.  Neck: Neck supple. No tracheal deviation present.  Cardiovascular: Normal rate.   Pulmonary/Chest: Effort normal. No respiratory distress.  Abdominal: Soft. Normal appearance. She exhibits no distension. There is no tenderness.  Genitourinary:  Pt reports being on  period.  Scant blood present on pelvic exam, no active bleeding. No purulent vaginal discharge. Cervix closed. No cmt, no adx tenderness or masses.   Musculoskeletal: She exhibits no edema.  Neurological: She is alert.  Skin: Skin is warm and dry. No rash noted.  Psychiatric: She has a normal mood and affect.  Nursing note and vitals reviewed.   ED Course  Procedures (including critical care time) Labs Review  Results for orders placed or performed during the hospital encounter of 07/13/14  Wet prep, genital  Result Value Ref Range   Yeast Wet Prep HPF POC NONE SEEN NONE SEEN   Trich, Wet Prep NONE SEEN NONE SEEN   Clue Cells Wet Prep HPF POC NONE SEEN NONE SEEN   WBC, Wet Prep HPF POC FEW (A) NONE SEEN  Urinalysis, Routine w reflex microscopic (not at [facility])  Result Value Ref Range   Color, Urine YELLOW YELLOW   APPearance CLEAR CLEAR   Specific Gravity, Urine 1.021 1.005 - 1.030   pH 7.0 5.0 - 8.0   Glucose, UA NEGATIVE NEGATIVE mg/dL   Hgb urine dipstick TRACE (A) NEGATIVE   Bilirubin Urine NEGATIVE NEGATIVE   Ketones, ur NEGATIVE NEGATIVE mg/dL   Protein, ur NEGATIVE NEGATIVE mg/dL   Urobilinogen, UA 1.0 0.0 - 1.0 mg/dL   Nitrite NEGATIVE NEGATIVE   Leukocytes, UA SMALL (A) NEGATIVE  Urine microscopic-add on  Result Value Ref Range   Squamous Epithelial / LPF FEW (A) RARE   WBC, UA 7-10 <3 WBC/hpf   RBC / HPF 0-2 <3 RBC/hpf   Bacteria, UA RARE RARE  POC Urine Pregnancy, ED (do NOT order at [facility])  Result Value Ref Range   Preg Test, Ur NEGATIVE NEGATIVE       MDM   Labs sent.  Reviewed nursing notes and prior charts for additional history.   Pt afeb. No dysuria. Gc/ch and hiv sent.  Pt informed may call/return for results, and notification if pos. pcp f/u.  Pt currently stable for d/c.      Cathren Laine, MD 07/13/14 [phone]

## 2014-07-14 LAB — GC/CHLAMYDIA PROBE AMP (~~LOC~~) NOT AT ARMC
Chlamydia: NEGATIVE
NEISSERIA GONORRHEA: POSITIVE — AB

## 2014-07-16 ENCOUNTER — Encounter (HOSPITAL_COMMUNITY): Payer: Self-pay | Admitting: *Deleted

## 2014-07-16 ENCOUNTER — Telehealth (HOSPITAL_COMMUNITY): Payer: Self-pay

## 2014-07-16 ENCOUNTER — Emergency Department (HOSPITAL_COMMUNITY)
Admission: EM | Admit: 2014-07-16 | Discharge: 2014-07-16 | Disposition: A | Discharge status: Home / Self Care | Payer: Medicaid Other | Attending: Emergency Medicine | Admitting: Emergency Medicine

## 2014-07-16 DIAGNOSIS — J45909 Unspecified asthma, uncomplicated: Secondary | ICD-10-CM | POA: Insufficient documentation

## 2014-07-16 DIAGNOSIS — Z79899 Other long term (current) drug therapy: Secondary | ICD-10-CM | POA: Insufficient documentation

## 2014-07-16 DIAGNOSIS — Z3202 Encounter for pregnancy test, result negative: Secondary | ICD-10-CM | POA: Insufficient documentation

## 2014-07-16 DIAGNOSIS — E669 Obesity, unspecified: Secondary | ICD-10-CM | POA: Insufficient documentation

## 2014-07-16 DIAGNOSIS — Z720 Tobacco use: Secondary | ICD-10-CM | POA: Insufficient documentation

## 2014-07-16 DIAGNOSIS — A64 Unspecified sexually transmitted disease: Secondary | ICD-10-CM

## 2014-07-16 DIAGNOSIS — A549 Gonococcal infection, unspecified: Secondary | ICD-10-CM | POA: Insufficient documentation

## 2014-07-16 MED ORDER — CEFTRIAXONE SODIUM 250 MG IJ SOLR
250.0000 mg | Freq: Once | INTRAMUSCULAR | Status: AC
  Administered 2014-07-16: 250 mg via INTRAMUSCULAR
  Filled 2014-07-16: qty 250

## 2014-07-16 MED ORDER — LIDOCAINE HCL (PF) 1 % IJ SOLN
0.9000 mL | Freq: Once | INTRAMUSCULAR | Status: AC
  Administered 2014-07-16: 0.9 mL
  Filled 2014-07-16: qty 5

## 2014-07-16 MED ORDER — AZITHROMYCIN 250 MG PO TABS
1000.0000 mg | ORAL_TABLET | Freq: Once | ORAL | Status: AC
  Administered 2014-07-16: 1000 mg via ORAL
  Filled 2014-07-16: qty 4

## 2014-07-16 NOTE — Discharge Instructions (Signed)
Follow up with [facility] Department STD clinic for future STD concerns or screenings. This is the recommendation by the CDC for people with multiple sexual partners or hx of STDs. You have been treated for gonorrhea and chlamydia in the ER due to your positive test. You were tested for HIV at your last visit which was negative. Follow up with your regular doctor in 1 week as needed for ongoing symptoms. Always wear condoms. Avoid sexual activity for the next 10 days. Get all partners tested and treated. Return to the ER for changes or worsening symptoms.   Sexually Transmitted Disease A sexually transmitted disease (STD) is a disease or infection often passed to another person during sex. However, STDs can be passed through nonsexual ways. An STD can be passed through:  Spit (saliva).  Semen.  Blood.  Mucus from the vagina.  Pee (urine). HOW CAN I LESSEN MY CHANCES OF GETTING AN STD?  Use:  Latex condoms.  Water-soluble lubricants with condoms. Do not use petroleum jelly or oils.  Dental dams. These are small pieces of latex that are used as a barrier during oral sex.  Avoid having more than one sex partner.  Do not have sex with someone who has other sex partners.  Do not have sex with anyone you do not know or who is at high risk for an STD.  Avoid risky sex that can break your skin.  Do not have sex if you have open sores on your mouth or skin.  Avoid drinking too much alcohol or taking illegal drugs. Alcohol and drugs can affect your good judgment.  Avoid oral and anal sex acts.  Get shots (vaccines) for HPV and hepatitis.  If you are at risk of being infected with HIV, it is advised that you take a certain medicine daily to prevent HIV infection. This is called pre-exposure prophylaxis (PrEP). You may be at risk if:  You are a man who has sex with other men (MSM).  You are attracted to the opposite sex (heterosexual) and are having sex with more than one  partner.  You take drugs with a needle.  You have sex with someone who has HIV.  Talk with your doctor about if you are at high risk of being infected with HIV. If you begin to take PrEP, get tested for HIV first. Get tested every 3 months for as long as you are taking PrEP. WHAT SHOULD I DO IF I THINK I HAVE AN STD?  See your doctor.  Tell your sex partner(s) that you have an STD. They should be tested and treated.  Do not have sex until your doctor says it is okay. WHEN SHOULD I GET HELP? Get help right away if:  You have bad belly (abdominal) pain.  You are a man and have puffiness (swelling) or pain in your testicles.  You are a woman and have puffiness in your vagina. Document Released: 01/27/2004 Document Revised: 12/24/2012 Document Reviewed: 06/14/2012 [facility] Patient Information 2015 [place], [place]. This information is not intended to replace advice given to you by your health care provider. Make sure you discuss any questions you have with your health care provider.  Gonorrhea Gonorrhea is an infection that can cause serious problems. If left untreated, the infection may:   Damage the female or female organs.   Cause women to be unable to have children (sterility).   Harm a fetus if the infected woman is pregnant.  It is important to get  treatment for gonorrhea as soon as possible. It is also necessary that all your sexual partners be tested for the infection.  CAUSES  Gonorrhea is caused by bacteria called Neisseria gonorrhoeae. The infection is spread from person to person, usually by sexual contact (such as by anal, vaginal, or oral means). A newborn can contract the infection from his or her mother during birth.  SYMPTOMS  Some people with gonorrhea do not have symptoms. Symptoms may be different in females and males.  Females The most common symptoms are:   Pain in the lower abdomen.   Fever with or without chills.  Other symptoms include:   Abnormal  vaginal discharge.   Painful intercourse.   Burning or itching of the vagina or lips of the vagina.   Abnormal vaginal bleeding.   Pain when urinating.   Long-lasting (chronic) pain in the lower abdomen, especially during menstruation or intercourse.   Inability to become pregnant.   Going into premature labor.   Irritation, pain, bleeding, or discharge from the rectum. This may occur if the infection was spread by anal sex.   Sore throat or swollen lymph nodes in the neck. This may occur if the infection was spread by oral sex.  Males The most common symptoms are:   Discharge from the penis.   Pain or burning during urination.   Pain or swelling in the testicles. Other symptoms may include:   Irritation, pain, bleeding, or discharge from the rectum. This may occur if the infection was spread by anal sex.   Sore throat, fever, or swollen lymph nodes in the neck. This may occur if the infection was spread by oral sex.  DIAGNOSIS  A diagnosis is made after a physical exam is done and a sample of discharge is examined under a microscope for the presence of the bacteria. The discharge may be taken from the urethra, cervix, throat, or rectum.  TREATMENT  Gonorrhea is treated with antibiotic medicines. It is important for treatment to begin as soon as possible. Early treatment may prevent some problems from developing.  HOME CARE INSTRUCTIONS   Take medicines only as directed by your health care provider.   Take your antibiotic medicine as directed by your health care provider. Finish the antibiotic even if you start to feel better. Incomplete treatment will put you at risk for continued infection.   Do not have sex until treatment is complete or as directed by your health care provider.   Keep all follow-up visits as directed by your health care provider.   Not all test results are available during your visit. If your test results are not back during the  visit, make an appointment with your health care provider to find out the results. Do not assume everything is normal if you have not heard from your health care provider or the medical facility. It is your responsibility to get your test results.  If you test positive for gonorrhea, inform your recent sexual partners. They need to be checked for gonorrhea even if they do not have symptoms. They may need treatment, even if they test negative for gonorrhea.  SEEK MEDICAL CARE IF:   You develop any bad reaction to the medicine you were prescribed. This may include:   A rash.   Nausea.   Vomiting.   Diarrhea.   Your symptoms do not improve after a few days of taking antibiotics.   Your symptoms get worse.   You develop increased pain, such as in  the testicles (for males) or in the abdomen (for females).  You have a fever. MAKE SURE YOU:   Understand these instructions.  Will watch your condition.  Will get help right away if you are not doing well or get worse. Document Released: 12/17/1999 Document Revised: 05/05/2013 Document Reviewed: 06/26/2012 [facility] Patient Information 2015 [place], [place]. This information is not intended to replace advice given to you by your health care provider. Make sure you discuss any questions you have with your health care provider.

## 2014-07-16 NOTE — ED Provider Notes (Signed)
CSN: [id number]     Arrival date & time 07/16/14  1424 History   This chart was scribed for Tamara StraussMercedes Camprubi-Soms, PA-C working with No att. providers found by Tamara Bryant, ED Scribe. This patient was seen in room WTR9/WTR9 and the patient's care was started at 2:58 PM.   Chief Complaint  Patient presents with  . SEXUALLY TRANSMITTED DISEASE   Patient is a 24 y.o. female presenting with STD exposure. The history is provided by the patient. No language interpreter was used.  Exposure to STD This is a new problem. The current episode started more than 1 week ago. The problem occurs constantly. The problem has not changed since onset.Pertinent negatives include no chest pain, no abdominal pain and no shortness of breath. Nothing aggravates the symptoms. Nothing relieves the symptoms. She has tried nothing for the symptoms. The treatment provided no relief.   HPI Comments: Tamara Bryant is a 24 y.o. female who presents to the Emergency Department treatment of gonorrhea after positive test. Patient evaluated in ED 7/11, requesting STD screening. Patient asymptomatic at time patient shared engaging in unprotected sex with her boyfriend for several weeks. Patient denies fever, chills, shortness of breath, chest pain, abdominal pain, nausea, vomiting, diarrhea, numbness, tingling, weakness, urinary symptoms, vaginal bleeding or discharge.   Past Medical History  Diagnosis Date  . Asthma   . Obesity    Past Surgical History  Procedure Laterality Date  . Wisdom tooth extraction     Family History  Problem Relation Age of Onset  . Hypertension Mother   . Sickle cell anemia Mother   . Diabetes Father   . Hypertension Father   . Diabetes Maternal Grandmother   . Diabetes Paternal Grandfather    History  Substance Use Topics  . Smoking status: Current Every Day Smoker -- 1.00 packs/day    Types: Cigarettes  . Smokeless tobacco: Not on file  . Alcohol Use: Yes     Comment: Occasional     OB History    Gravida Para Term Preterm AB TAB SAB Ectopic Multiple Living   0              Review of Systems  Constitutional: Negative for fever and chills.  Respiratory: Negative for shortness of breath.   Cardiovascular: Negative for chest pain.  Gastrointestinal: Negative for nausea, vomiting, abdominal pain and diarrhea.  Genitourinary: Negative for dysuria, hematuria, vaginal bleeding and vaginal discharge.  Musculoskeletal: Negative for myalgias and arthralgias.  Skin: Negative for color change.  Allergic/Immunologic: Negative for immunocompromised state.  Neurological: Negative for weakness and numbness.  A complete 10 system review of systems was obtained and all systems are negative except as noted in the HPI and PMH.   Allergies  Review of patient's allergies indicates no known allergies.  Home Medications   Prior to Admission medications   Medication Sig Start Date End Date Taking? Authorizing Provider  albuterol (PROVENTIL HFA;VENTOLIN HFA) 108 (90 BASE) MCG/ACT inhaler Inhale 2 puffs into the lungs every 6 (six) hours as needed for wheezing or shortness of breath.     Historical Provider, MD  ferrous sulfate 325 (65 FE) MG tablet Take 1 tablet (325 mg total) by mouth 2 (two) times daily with a meal. Patient not taking: Reported on 07/13/2014 04/08/13   Jean RosenthalSusan P Lineberry, NP  guaiFENesin (MUCINEX) 600 MG 12 hr tablet Take 1 tablet (600 mg total) by mouth 2 (two) times daily. Patient not taking: Reported on 07/13/2014 04/13/13   Theron AristaPeter  Dammen, PA-C  ibuprofen (ADVIL,MOTRIN) 200 MG tablet Take 800 mg by mouth every 4 (four) hours as needed for fever, headache, mild pain, moderate pain or cramping.    Historical Provider, MD  ibuprofen (ADVIL,MOTRIN) 600 MG tablet Take 1 tablet (600 mg total) by mouth every 6 (six) hours as needed. Patient not taking: Reported on 07/13/2014 04/22/13   Derwood Kaplan, MD  norgestimate-ethinyl estradiol (ORTHO-CYCLEN,SPRINTEC,PREVIFEM) 0.25-35  MG-MCG tablet Take 1 tablet by mouth daily. 3pills qd x 3 d then 2 pills qd x 2 days the one daily until appointment Patient not taking: Reported on 07/13/2014 04/08/13   Jean Rosenthal, NP  simethicone (GAS-X) 80 MG chewable tablet Chew 1 tablet (80 mg total) by mouth every 6 (six) hours as needed for flatulence. Patient not taking: Reported on 07/13/2014 04/22/13   Derwood Kaplan, MD   Triage Vitals: BP 111/76 mmHg  Pulse 99  Temp(Src) 98.2 F (36.8 C) (Oral)  Resp 20  SpO2 98%  LMP 07/12/2014 Physical Exam  Constitutional: She is oriented to person, place, and time. Vital signs are normal. She appears well-developed and well-nourished.  Non-toxic appearance. No distress.  Afebrile, nontoxic, NAD  HENT:  Head: Normocephalic and atraumatic.  Mouth/Throat: Mucous membranes are normal.  Eyes: Conjunctivae and EOM are normal. Right eye exhibits no discharge. Left eye exhibits no discharge.  Neck: Normal range of motion. Neck supple.  Cardiovascular: Normal rate.   Pulmonary/Chest: Effort normal. No respiratory distress.  Abdominal: Normal appearance. She exhibits no distension.  Musculoskeletal: Normal range of motion.  Neurological: She is alert and oriented to person, place, and time. She has normal strength. No sensory deficit.  Skin: Skin is warm, dry and intact. No rash noted.  Psychiatric: She has a normal mood and affect. Her behavior is normal.  Nursing note and vitals reviewed.   ED Course  Procedures (including critical care time)  COORDINATION OF CARE: 2:59 PM- Discussed treatment plan with patient at bedside and patient agreed to plan.   Labs Review Labs Reviewed - No data to display Results for orders placed or performed during the hospital encounter of 07/13/14  Wet prep, genital  Result Value Ref Range   Yeast Wet Prep HPF POC NONE SEEN NONE SEEN   Trich, Wet Prep NONE SEEN NONE SEEN   Clue Cells Wet Prep HPF POC NONE SEEN NONE SEEN   WBC, Wet Prep HPF POC FEW  (A) NONE SEEN  Urinalysis, Routine w reflex microscopic (not at [facility])  Result Value Ref Range   Color, Urine YELLOW YELLOW   APPearance CLEAR CLEAR   Specific Gravity, Urine 1.021 1.005 - 1.030   pH 7.0 5.0 - 8.0   Glucose, UA NEGATIVE NEGATIVE mg/dL   Hgb urine dipstick TRACE (A) NEGATIVE   Bilirubin Urine NEGATIVE NEGATIVE   Ketones, ur NEGATIVE NEGATIVE mg/dL   Protein, ur NEGATIVE NEGATIVE mg/dL   Urobilinogen, UA 1.0 0.0 - 1.0 mg/dL   Nitrite NEGATIVE NEGATIVE   Leukocytes, UA SMALL (A) NEGATIVE  HIV antibody  Result Value Ref Range   HIV Screen 4th Generation wRfx Non Reactive Non Reactive  Urine microscopic-add on  Result Value Ref Range   Squamous Epithelial / LPF FEW (A) RARE   WBC, UA 7-10 <3 WBC/hpf   RBC / HPF 0-2 <3 RBC/hpf   Bacteria, UA RARE RARE  POC Urine Pregnancy, ED (do NOT order at [facility])  Result Value Ref Range   Preg Test, Ur NEGATIVE NEGATIVE  GC/Chlamydia probe amp ([facility])not at [facility]  Result Value Ref Range   Chlamydia Negative    Neisseria gonorrhea **POSITIVE** (A)     Imaging Review No results found.   EKG Interpretation None      MDM   Final diagnoses:  STD (female)  Gonorrhea    24 y.o. female  here for tx for STD. Asymptomatic. Tested positive for gonorrhea. Will treat with IM rocephin and azithro. Discussed using condoms and no sexual activity for 10 days. Discussed getting all partners tested and treated. I explained the diagnosis and have given explicit precautions to return to the ER including for any other new or worsening symptoms. The patient understands and accepts the medical plan as it's been dictated and I have answered their questions. Discharge instructions concerning home care and prescriptions have been given. The patient is STABLE and is discharged to home in good condition.   I personally performed the services described in this documentation, which was scribed in my presence. The recorded information has been  reviewed and is accurate.  BP 111/76 mmHg  Pulse 99  Temp(Src) 98.2 F (36.8 C) (Oral)  Resp 20  SpO2 98%  LMP 07/12/2014  Meds ordered this encounter  Medications  . azithromycin (ZITHROMAX) tablet 1,000 mg    Sig:    And  . cefTRIAXone (ROCEPHIN) injection 250 mg    Sig:     Order Specific Question:  Antibiotic Indication:    Answer:  STD  . lidocaine (PF) (XYLOCAINE) 1 % injection 0.9 mL    Sig:      Semaje Kinker Camprubi-Soms, PA-C 07/16/14 1503  Tilden Fossa, MD 07/16/14 [phone]

## 2014-07-16 NOTE — ED Notes (Signed)
Pt returned call . Informed of lab results. Per Chris Lawyer pt needs to return for tx of gonorrhea. Advised to notify partner(s) and to abstain from sexual activity until 10 days after treatment. 

## 2014-07-16 NOTE — ED Notes (Signed)
Pt reports she was seen in ED on 7/11 for pelvic pain, tested for STDs. Pt reports she received a call today telling her that she and her boyfriend needed to come back to be treated for gonorrhea. Pt denies pain. Denies vaginal discharge.

## 2014-07-16 NOTE — ED Notes (Signed)
Chart returned from EDP office. Ebbie Ridgehris Lawyer reviewed, pt needs to return for Rocephin IM. Attempting to contact.

## 2014-08-07 ENCOUNTER — Encounter (HOSPITAL_COMMUNITY): Payer: Self-pay

## 2014-08-07 ENCOUNTER — Emergency Department (HOSPITAL_COMMUNITY)
Admission: EM | Admit: 2014-08-07 | Discharge: 2014-08-07 | Disposition: A | Discharge status: Home / Self Care | Payer: Medicaid Other | Attending: Emergency Medicine | Admitting: Emergency Medicine

## 2014-08-07 DIAGNOSIS — S3992XA Unspecified injury of lower back, initial encounter: Secondary | ICD-10-CM | POA: Insufficient documentation

## 2014-08-07 DIAGNOSIS — J45909 Unspecified asthma, uncomplicated: Secondary | ICD-10-CM | POA: Insufficient documentation

## 2014-08-07 DIAGNOSIS — E669 Obesity, unspecified: Secondary | ICD-10-CM | POA: Insufficient documentation

## 2014-08-07 DIAGNOSIS — S199XXA Unspecified injury of neck, initial encounter: Secondary | ICD-10-CM | POA: Insufficient documentation

## 2014-08-07 DIAGNOSIS — M6289 Other specified disorders of muscle: Secondary | ICD-10-CM | POA: Insufficient documentation

## 2014-08-07 DIAGNOSIS — Y929 Unspecified place or not applicable: Secondary | ICD-10-CM | POA: Insufficient documentation

## 2014-08-07 DIAGNOSIS — Z3202 Encounter for pregnancy test, result negative: Secondary | ICD-10-CM | POA: Insufficient documentation

## 2014-08-07 DIAGNOSIS — Y939 Activity, unspecified: Secondary | ICD-10-CM | POA: Insufficient documentation

## 2014-08-07 DIAGNOSIS — Z041 Encounter for examination and observation following transport accident: Secondary | ICD-10-CM

## 2014-08-07 DIAGNOSIS — M791 Myalgia, unspecified site: Secondary | ICD-10-CM

## 2014-08-07 DIAGNOSIS — Y92410 Unspecified street and highway as the place of occurrence of the external cause: Secondary | ICD-10-CM | POA: Insufficient documentation

## 2014-08-07 DIAGNOSIS — Z79899 Other long term (current) drug therapy: Secondary | ICD-10-CM | POA: Insufficient documentation

## 2014-08-07 DIAGNOSIS — Y999 Unspecified external cause status: Secondary | ICD-10-CM | POA: Insufficient documentation

## 2014-08-07 DIAGNOSIS — Z720 Tobacco use: Secondary | ICD-10-CM | POA: Insufficient documentation

## 2014-08-07 LAB — POC URINE PREG, ED: PREG TEST UR: NEGATIVE

## 2014-08-07 MED ORDER — IBUPROFEN 800 MG PO TABS: 800.0000 mg | ORAL_TABLET | Freq: Four times a day (QID) | ORAL | Status: DC | PRN

## 2014-08-07 MED ORDER — KETOROLAC TROMETHAMINE 60 MG/2ML IM SOLN
60.0000 mg | Freq: Once | INTRAMUSCULAR | Status: AC
  Administered 2014-08-07: 60 mg via INTRAMUSCULAR
  Filled 2014-08-07: qty 2
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
## 2014-08-07 NOTE — Discharge Instructions (Signed)

## 2014-08-07 NOTE — ED Notes (Signed)
Verbalized understanding discharge instructions. In no acute distress.   
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
## 2014-08-07 NOTE — ED Notes (Signed)
Pt ready for D\C

## 2014-08-12 ENCOUNTER — Encounter (HOSPITAL_COMMUNITY): Payer: Self-pay

## 2014-08-12 ENCOUNTER — Encounter (HOSPITAL_COMMUNITY): Payer: Self-pay | Admitting: Emergency Medicine

## 2014-08-12 ENCOUNTER — Emergency Department (HOSPITAL_COMMUNITY)
Admission: EM | Admit: 2014-08-12 | Discharge: 2014-08-12 | Discharge status: Against Medical Advice | Payer: Medicaid Other | Attending: Emergency Medicine | Admitting: Emergency Medicine

## 2014-08-12 ENCOUNTER — Emergency Department (HOSPITAL_COMMUNITY)
Admission: EM | Admit: 2014-08-12 | Discharge: 2014-08-12 | Disposition: A | Discharge status: Home / Self Care | Payer: Medicaid Other | Attending: Emergency Medicine | Admitting: Emergency Medicine

## 2014-08-12 DIAGNOSIS — J45909 Unspecified asthma, uncomplicated: Secondary | ICD-10-CM | POA: Insufficient documentation

## 2014-08-12 DIAGNOSIS — E669 Obesity, unspecified: Secondary | ICD-10-CM | POA: Insufficient documentation

## 2014-08-12 DIAGNOSIS — R0789 Other chest pain: Secondary | ICD-10-CM | POA: Insufficient documentation

## 2014-08-12 DIAGNOSIS — Z720 Tobacco use: Secondary | ICD-10-CM | POA: Insufficient documentation

## 2014-08-12 DIAGNOSIS — R109 Unspecified abdominal pain: Secondary | ICD-10-CM | POA: Insufficient documentation

## 2014-08-12 DIAGNOSIS — R1084 Generalized abdominal pain: Secondary | ICD-10-CM | POA: Insufficient documentation

## 2014-08-12 DIAGNOSIS — Z3202 Encounter for pregnancy test, result negative: Secondary | ICD-10-CM | POA: Insufficient documentation

## 2014-08-12 DIAGNOSIS — R110 Nausea: Secondary | ICD-10-CM

## 2014-08-12 DIAGNOSIS — Z79899 Other long term (current) drug therapy: Secondary | ICD-10-CM | POA: Insufficient documentation

## 2014-08-12 DIAGNOSIS — G44209 Tension-type headache, unspecified, not intractable: Secondary | ICD-10-CM

## 2014-08-12 DIAGNOSIS — R51 Headache: Secondary | ICD-10-CM | POA: Insufficient documentation

## 2014-08-12 DIAGNOSIS — R079 Chest pain, unspecified: Secondary | ICD-10-CM

## 2014-08-12 LAB — CBC WITH DIFFERENTIAL/PLATELET
BASOS PCT: 0 % (ref 0–1)
Basophils Absolute: 0 10*3/uL (ref 0.0–0.1)
EOS ABS: 0.2 10*3/uL (ref 0.0–0.7)
Eosinophils Relative: 2 % (ref 0–5)
HCT: 38.6 % (ref 36.0–46.0)
Hemoglobin: 12.9 g/dL (ref 12.0–15.0)
LYMPHS ABS: 4.7 10*3/uL — AB (ref 0.7–4.0)
Lymphocytes Relative: 42 % (ref 12–46)
MCH: 27 pg (ref 26.0–34.0)
MCHC: 33.4 g/dL (ref 30.0–36.0)
MCV: 80.8 fL (ref 78.0–100.0)
Monocytes Absolute: 0.6 10*3/uL (ref 0.1–1.0)
Monocytes Relative: 5 % (ref 3–12)
Neutro Abs: 5.9 10*3/uL (ref 1.7–7.7)
Neutrophils Relative %: 51 % (ref 43–77)
PLATELETS: 289 10*3/uL (ref 150–400)
RBC: 4.78 MIL/uL (ref 3.87–5.11)
RDW: 14.9 % (ref 11.5–15.5)
WBC: 11.4 10*3/uL — ABNORMAL HIGH (ref 4.0–10.5)

## 2014-08-12 LAB — POC URINE PREG, ED: Preg Test, Ur: NEGATIVE

## 2014-08-12 LAB — URINE MICROSCOPIC-ADD ON

## 2014-08-12 LAB — COMPREHENSIVE METABOLIC PANEL
ALT: 18 U/L (ref 14–54)
ANION GAP: 7 (ref 5–15)
AST: 17 U/L (ref 15–41)
Albumin: 3.4 g/dL — ABNORMAL LOW (ref 3.5–5.0)
Alkaline Phosphatase: 58 U/L (ref 38–126)
BUN: 12 mg/dL (ref 6–20)
CHLORIDE: 105 mmol/L (ref 101–111)
CO2: 24 mmol/L (ref 22–32)
CREATININE: 0.77 mg/dL (ref 0.44–1.00)
Calcium: 9 mg/dL (ref 8.9–10.3)
GLUCOSE: 89 mg/dL (ref 65–99)
POTASSIUM: 3.7 mmol/L (ref 3.5–5.1)
Sodium: 136 mmol/L (ref 135–145)
Total Bilirubin: 0.6 mg/dL (ref 0.3–1.2)
Total Protein: 6 g/dL — ABNORMAL LOW (ref 6.5–8.1)

## 2014-08-12 LAB — URINALYSIS, ROUTINE W REFLEX MICROSCOPIC
Bilirubin Urine: NEGATIVE
GLUCOSE, UA: NEGATIVE mg/dL
KETONES UR: NEGATIVE mg/dL
Leukocytes, UA: NEGATIVE
Nitrite: NEGATIVE
Protein, ur: NEGATIVE mg/dL
Specific Gravity, Urine: 1.02 (ref 1.005–1.030)
Urobilinogen, UA: 0.2 mg/dL (ref 0.0–1.0)
pH: 6 (ref 5.0–8.0)

## 2014-08-12 MED ORDER — SODIUM CHLORIDE 0.9 % IV BOLUS (SEPSIS)
1000.0000 mL | Freq: Once | INTRAVENOUS | Status: AC
  Administered 2014-08-12: 1000 mL via INTRAVENOUS

## 2014-08-12 MED ORDER — KETOROLAC TROMETHAMINE 30 MG/ML IJ SOLN
30.0000 mg | Freq: Once | INTRAMUSCULAR | Status: AC
  Administered 2014-08-12: 30 mg via INTRAVENOUS
  Filled 2014-08-12: qty 1

## 2014-08-12 MED ORDER — ONDANSETRON HCL 4 MG/2ML IJ SOLN
4.0000 mg | INTRAMUSCULAR | Status: AC
  Administered 2014-08-12: 4 mg via INTRAVENOUS
  Filled 2014-08-12: qty 2

## 2014-08-12 MED ORDER — DIPHENHYDRAMINE HCL 50 MG/ML IJ SOLN
12.5000 mg | Freq: Once | INTRAMUSCULAR | Status: AC
  Administered 2014-08-12: 12.5 mg via INTRAVENOUS
  Filled 2014-08-12: qty 1

## 2014-08-12 MED ORDER — METOCLOPRAMIDE HCL 5 MG/ML IJ SOLN
10.0000 mg | Freq: Once | INTRAMUSCULAR | Status: AC
  Administered 2014-08-12: 10 mg via INTRAVENOUS
  Filled 2014-08-12: qty 2

## 2014-08-12 NOTE — ED Notes (Signed)
Pt states she just does not feel good  Pt states her head is killing her, she feels weak all over, has sharp pain in her stomach, and is nauseated  Pt states sxs started 2 to 3 days ago

## 2014-08-12 NOTE — ED Notes (Signed)
Pt presents w/ multiple complaints including generalized fatigue, nausea, sharp abdominal pain, and chest pain x 3-4 days. No significant medical hx. Denies vomiting/diarrhea

## 2014-08-12 NOTE — ED Provider Notes (Signed)
CSN: [id number]     Arrival date & time 08/12/14  0612 History   First MD Initiated Contact with Patient 08/12/14 [phone]     Chief Complaint  Patient presents with  . Fatigue  . Abdominal Pain  . Chest Pain    HPI   Tamara Bryant is a 24 year old female with a past medical history of asthma and obesity who presents to the ED with fatigue, headache, chest pain, nausea, and abdominal pain, which she reports began this morning when she woke up. She reports malaise, and states she has not been feeling well over the past few days. She reports her symptoms worsened this morning, prompting her to come to the ED. She states her headache is similar to her typical headache, and reports it is located across her forehead. She states her chest pain is constant, left-sided, and feels sharp. She denies radiation. She reports her abdominal pain is constant, generalized, and feels sharp. She denies neck pain or stiffness, fever, chills, palpitations, shortness of breath, vomiting, diarrhea, dysuria, urgency, frequency, vaginal discharge, recent sick contact. She reports she feels "weak everywhere."    Past Medical History  Diagnosis Date  . Asthma   . Obesity    Past Surgical History  Procedure Laterality Date  . Wisdom tooth extraction     Family History  Problem Relation Age of Onset  . Hypertension Mother   . Sickle cell anemia Mother   . Diabetes Father   . Hypertension Father   . Diabetes Maternal Grandmother   . Diabetes Paternal Grandfather    Social History  Substance Use Topics  . Smoking status: Current Every Day Smoker -- 1.00 packs/day    Types: Cigarettes  . Smokeless tobacco: Never Used  . Alcohol Use: Yes     Comment: Occasional    OB History    Gravida Para Term Preterm AB TAB SAB Ectopic Multiple Living   0               Review of Systems  Constitutional: Positive for appetite change and fatigue. Negative for fever, chills and diaphoresis.       Reports decreased  appetite due to nausea.  HENT: Negative for congestion, rhinorrhea and sore throat.   Respiratory: Negative for shortness of breath and wheezing.   Cardiovascular: Positive for chest pain. Negative for palpitations and leg swelling.       Reports "sharp" chest pain.  Gastrointestinal: Positive for nausea and abdominal pain. Negative for vomiting, diarrhea, constipation and abdominal distention.       Reports "sharp" abdominal pain.  Genitourinary: Negative for dysuria, urgency, frequency and vaginal discharge.  Musculoskeletal: Negative for myalgias, back pain, arthralgias, neck pain and neck stiffness.  Skin: Negative for color change, pallor, rash and wound.  Neurological: Positive for weakness, light-headedness and headaches. Negative for dizziness and numbness.       Reports headache over right and left forehead. States she feels lightheaded. Reports she feels weak "everywhere."    Allergies  Review of patient's allergies indicates no known allergies.  Home Medications   Prior to Admission medications   Medication Sig Start Date End Date Taking? Authorizing Provider  albuterol (PROVENTIL HFA;VENTOLIN HFA) 108 (90 BASE) MCG/ACT inhaler Inhale 2 puffs into the lungs every 6 (six) hours as needed for wheezing or shortness of breath.     Historical Provider, MD  cetirizine (ZYRTEC) 10 MG tablet Take 10 mg by mouth daily as needed for allergies.    Historical  Provider, MD  diphenhydrAMINE (SOMINEX) 25 MG tablet Take 25 mg by mouth daily as needed for allergies or sleep.    Historical Provider, MD  ferrous sulfate 325 (65 FE) MG tablet Take 1 tablet (325 mg total) by mouth 2 (two) times daily with a meal. Patient not taking: Reported on 07/13/2014 04/08/13   Jean Rosenthal, NP  guaiFENesin (MUCINEX) 600 MG 12 hr tablet Take 1 tablet (600 mg total) by mouth 2 (two) times daily. Patient not taking: Reported on 07/13/2014 04/13/13   Ivonne Andrew, PA-C  ibuprofen (ADVIL,MOTRIN) 800 MG tablet Take  1 tablet (800 mg total) by mouth every 6 (six) hours as needed for moderate pain. 08/07/14   Lyndal Pulley, MD  norgestimate-ethinyl estradiol (ORTHO-CYCLEN,SPRINTEC,PREVIFEM) 0.25-35 MG-MCG tablet Take 1 tablet by mouth daily. 3pills qd x 3 d then 2 pills qd x 2 days the one daily until appointment Patient not taking: Reported on 07/13/2014 04/08/13   Jean Rosenthal, NP  simethicone (GAS-X) 80 MG chewable tablet Chew 1 tablet (80 mg total) by mouth every 6 (six) hours as needed for flatulence. Patient not taking: Reported on 07/13/2014 04/22/13   Derwood Kaplan, MD    BP 102/64 mmHg  Pulse 81  Temp(Src) 98.6 F (37 C) (Oral)  Resp 20  SpO2 100%  LMP 08/07/2014 (Approximate) Physical Exam  Constitutional: She is oriented to person, place, and time. Vital signs are normal. She appears well-developed and well-nourished. No distress.  HENT:  Head: Normocephalic and atraumatic.  Right Ear: External ear normal.  Left Ear: External ear normal.  Nose: Nose normal.  Mouth/Throat: Uvula is midline, oropharynx is clear and moist and mucous membranes are normal. No oropharyngeal exudate.  Eyes: Conjunctivae and EOM are normal. Pupils are equal, round, and reactive to light.  Neck: Normal range of motion. Neck supple.  Cardiovascular: Normal rate, regular rhythm, normal heart sounds and intact distal pulses.   Pulmonary/Chest: Effort normal and breath sounds normal. No respiratory distress. She has no wheezes. She has no rales. She exhibits tenderness.  Tenderness to palpation over left chest wall.  Abdominal: Soft. Normal appearance and bowel sounds are normal. She exhibits no distension and no mass. There is no tenderness. There is no rebound and no guarding.  Musculoskeletal: Normal range of motion. She exhibits no edema or tenderness.  Lymphadenopathy:    She has no cervical adenopathy.  Neurological: She is alert and oriented to person, place, and time. She has normal strength. No cranial nerve  deficit or sensory deficit.  Skin: Skin is warm and dry. No rash noted. She is not diaphoretic. No erythema. No pallor.  Psychiatric: She has a normal mood and affect. Her behavior is normal.  Nursing note and vitals reviewed.   ED Course  Procedures (including critical care time)  Labs Review Labs Reviewed  CBC WITH DIFFERENTIAL/PLATELET - Abnormal; Notable for the following:    WBC 11.4 (*)    Lymphs Abs 4.7 (*)    All other components within normal limits  COMPREHENSIVE METABOLIC PANEL - Abnormal; Notable for the following:    Total Protein 6.0 (*)    Albumin 3.4 (*)    All other components within normal limits  URINALYSIS, ROUTINE W REFLEX MICROSCOPIC (NOT AT [facility]) - Abnormal; Notable for the following:    Hgb urine dipstick MODERATE (*)    All other components within normal limits  URINE MICROSCOPIC-ADD ON - Abnormal; Notable for the following:    Bacteria, UA FEW (*)  All other components within normal limits  POC URINE PREG, ED    Imaging Review No results found.   EKG Interpretation None      MDM   Final diagnoses:  Tension-type headache, not intractable, unspecified chronicity pattern  Chest pain, unspecified chest pain type  Generalized abdominal pain  Nausea    24 year old female presents with fatigue, headache, chest pain, abdominal pain, nausea. Reports she has not been feeling well for the past few days, and that her symptoms worsened this morning. Denies recent sick contact. Urine pregnancy negative. UA negative for infection. CBC with mild leukocytosis (WBC 11.4 with absolute lymphocyte elevated at 4.7).  Patient is afebrile with no focal neuro deficits, no nuchal rigidity. Patient HA treated and improved while in ED. Presentation is like her typical HA and non-concerning for [facility], ICH, meningitis, or temporal arteritis. No recent travel or immobility, no history of malignancy or DVT. No lower extremity edema. No tachycardia or tachypnea. O2 sat 99% on  RA. PERC negative. No evidence for PE. EKG demonstrates no acute ischemia. Left sided chest pain reproducible on exam. Doubt ACS. Chest pain resolved while in the ED. Abdomen soft, non-distended, nontender to palpation. No rebound, guarding, or masses. Abdominal pain resolved while in the ED. Zofran given for nausea control. Suspect symptoms related to viral process. Patient is stable for discharge. Return precautions discussed. Patient to follow-up with PCP this week.   BP 120/71 mmHg  Pulse 69  Temp(Src) 98.6 F (37 C) (Oral)  Resp 13  SpO2 100%  LMP 08/07/2014 (Approximate)     Mady Gemma, PA-C 08/12/14 1648  Laurence Spates, MD 08/13/14 [phone]

## 2014-08-12 NOTE — Discharge Instructions (Signed)
1. Medications: usual home medications 2. Treatment: rest, drink plenty of fluids 3. Follow Up: please followup with your primary doctor this week for discussion of your diagnoses and further evaluation after today's visit; if you do not have a primary care doctor use the resource guide provided to find one; please return to the ER for new or worsening symptoms   Abdominal Pain, Women Abdominal (stomach, pelvic, or belly) pain can be caused by many things. It is important to tell your doctor:  The location of the pain.  Does it come and go or is it present all the time?  Are there things that start the pain (eating certain foods, exercise)?  Are there other symptoms associated with the pain (fever, nausea, vomiting, diarrhea)? All of this is helpful to know when trying to find the cause of the pain. CAUSES   Stomach: virus or bacteria infection, or ulcer.  Intestine: appendicitis (inflamed appendix), regional ileitis (Crohn's disease), ulcerative colitis (inflamed colon), irritable bowel syndrome, diverticulitis (inflamed diverticulum of the colon), or cancer of the stomach or intestine.  Gallbladder disease or stones in the gallbladder.  Kidney disease, kidney stones, or infection.  Pancreas infection or cancer.  Fibromyalgia (pain disorder).  Diseases of the female organs:  Uterus: fibroid (non-cancerous) tumors or infection.  Fallopian tubes: infection or tubal pregnancy.  Ovary: cysts or tumors.  Pelvic adhesions (scar tissue).  Endometriosis (uterus lining tissue growing in the pelvis and on the pelvic organs).  Pelvic congestion syndrome (female organs filling up with blood just before the menstrual period).  Pain with the menstrual period.  Pain with ovulation (producing an egg).  Pain with an IUD (intrauterine device, birth control) in the uterus.  Cancer of the female organs.  Functional pain (pain not caused by a disease, may improve without  treatment).  Psychological pain.  Depression. DIAGNOSIS  Your doctor will decide the seriousness of your pain by doing an examination.  Blood tests.  X-rays.  Ultrasound.  CT scan (computed tomography, special type of X-ray).  MRI (magnetic resonance imaging).  Cultures, for infection.  Barium enema (dye inserted in the large intestine, to better view it with X-rays).  Colonoscopy (looking in intestine with a lighted tube).  Laparoscopy (minor surgery, looking in abdomen with a lighted tube).  Major abdominal exploratory surgery (looking in abdomen with a large incision). TREATMENT  The treatment will depend on the cause of the pain.   Many cases can be observed and treated at home.  Over-the-counter medicines recommended by your caregiver.  Prescription medicine.  Antibiotics, for infection.  Birth control pills, for painful periods or for ovulation pain.  Hormone treatment, for endometriosis.  Nerve blocking injections.  Physical therapy.  Antidepressants.  Counseling with a psychologist or psychiatrist.  Minor or major surgery. HOME CARE INSTRUCTIONS   Do not take laxatives, unless directed by your caregiver.  Take over-the-counter pain medicine only if ordered by your caregiver. Do not take aspirin because it can cause an upset stomach or bleeding.  Try a clear liquid diet (broth or water) as ordered by your caregiver. Slowly move to a bland diet, as tolerated, if the pain is related to the stomach or intestine.  Have a thermometer and take your temperature several times a day, and record it.  Bed rest and sleep, if it helps the pain.  Avoid sexual intercourse, if it causes pain.  Avoid stressful situations.  Keep your follow-up appointments and tests, as your caregiver orders.  If the pain  does not go away with medicine or surgery, you may try:  Acupuncture.  Relaxation exercises (yoga, meditation).  Group therapy.  Counseling. SEEK  MEDICAL CARE IF:   You notice certain foods cause stomach pain.  Your home care treatment is not helping your pain.  You need stronger pain medicine.  You want your IUD removed.  You feel faint or lightheaded.  You develop nausea and vomiting.  You develop a rash.  You are having side effects or an allergy to your medicine. SEEK IMMEDIATE MEDICAL CARE IF:   Your pain does not go away or gets worse.  You have a fever.  Your pain is felt only in portions of the abdomen. The right side could possibly be appendicitis. The left lower portion of the abdomen could be colitis or diverticulitis.  You are passing blood in your stools (bright red or black tarry stools, with or without vomiting).  You have blood in your urine.  You develop chills, with or without a fever.  You pass out. MAKE SURE YOU:   Understand these instructions.  Will watch your condition.  Will get help right away if you are not doing well or get worse. Document Released: 10/16/2006 Document Revised: 05/05/2013 Document Reviewed: 11/05/2008 [facility] Patient Information 2015 [place], [place]. This information is not intended to replace advice given to you by your health care provider. Make sure you discuss any questions you have with your health care provider.  Chest Pain (Nonspecific) It is often hard to give a specific diagnosis for the cause of chest pain. There is always a chance that your pain could be related to something serious, such as a heart attack or a blood clot in the lungs. You need to follow up with your health care provider for further evaluation. CAUSES   Heartburn.  Pneumonia or bronchitis.  Anxiety or stress.  Inflammation around your heart (pericarditis) or lung (pleuritis or pleurisy).  A blood clot in the lung.  A collapsed lung (pneumothorax). It can develop suddenly on its own (spontaneous pneumothorax) or from trauma to the chest.  Shingles infection (herpes zoster  virus). The chest wall is composed of bones, muscles, and cartilage. Any of these can be the source of the pain.  The bones can be bruised by injury.  The muscles or cartilage can be strained by coughing or overwork.  The cartilage can be affected by inflammation and become sore (costochondritis). DIAGNOSIS  Lab tests or other studies may be needed to find the cause of your pain. Your health care provider may have you take a test called an ambulatory electrocardiogram (ECG). An ECG records your heartbeat patterns over a 24-hour period. You may also have other tests, such as:  Transthoracic echocardiogram (TTE). During echocardiography, sound waves are used to evaluate how blood flows through your heart.  Transesophageal echocardiogram (TEE).  Cardiac monitoring. This allows your health care provider to monitor your heart rate and rhythm in real time.  Holter monitor. This is a portable device that records your heartbeat and can help diagnose heart arrhythmias. It allows your health care provider to track your heart activity for several days, if needed.  Stress tests by exercise or by giving medicine that makes the heart beat faster. TREATMENT   Treatment depends on what may be causing your chest pain. Treatment may include:  Acid blockers for heartburn.  Anti-inflammatory medicine.  Pain medicine for inflammatory conditions.  Antibiotics if an infection is present.  You may be advised to change  lifestyle habits. This includes stopping smoking and avoiding alcohol, caffeine, and chocolate.  You may be advised to keep your head raised (elevated) when sleeping. This reduces the chance of acid going backward from your stomach into your esophagus. Most of the time, nonspecific chest pain will improve within 2-3 days with rest and mild pain medicine.  HOME CARE INSTRUCTIONS   If antibiotics were prescribed, take them as directed. Finish them even if you start to feel better.  For  the next few days, avoid physical activities that bring on chest pain. Continue physical activities as directed.  Do not use any tobacco products, including cigarettes, chewing tobacco, or electronic cigarettes.  Avoid drinking alcohol.  Only take medicine as directed by your health care provider.  Follow your health care provider's suggestions for further testing if your chest pain does not go away.  Keep any follow-up appointments you made. If you do not go to an appointment, you could develop lasting (chronic) problems with pain. If there is any problem keeping an appointment, call to reschedule. SEEK MEDICAL CARE IF:   Your chest pain does not go away, even after treatment.  You have a rash with blisters on your chest.  You have a fever. SEEK IMMEDIATE MEDICAL CARE IF:   You have increased chest pain or pain that spreads to your arm, neck, jaw, back, or abdomen.  You have shortness of breath.  You have an increasing cough, or you cough up blood.  You have severe back or abdominal pain.  You feel nauseous or vomit.  You have severe weakness.  You faint.  You have chills. This is an emergency. Do not wait to see if the pain will go away. Get medical help at once. Call your local emergency services (911 in [place] Do not drive yourself to the hospital. MAKE SURE YOU:   Understand these instructions.  Will watch your condition.  Will get help right away if you are not doing well or get worse. Document Released: 09/28/2004 Document Revised: 12/24/2012 Document Reviewed: 07/25/2007 [facility] Patient Information 2015 [place], [place]. This information is not intended to replace advice given to you by your health care provider. Make sure you discuss any questions you have with your health care provider.  Headaches, Frequently Asked Questions MIGRAINE HEADACHES Q: What is migraine? What causes it? How can I treat it? A: Generally, migraine headaches begin as a dull ache. Then  they develop into a constant, throbbing, and pulsating pain. You may experience pain at the temples. You may experience pain at the front or back of one or both sides of the head. The pain is usually accompanied by a combination of:  Nausea.  Vomiting.  Sensitivity to light and noise. Some people (about 15%) experience an aura (see below) before an attack. The cause of migraine is believed to be chemical reactions in the brain. Treatment for migraine may include over-the-counter or prescription medications. It may also include self-help techniques. These include relaxation training and biofeedback.  Q: What is an aura? A: About 15% of people with migraine get an "aura". This is a sign of neurological symptoms that occur before a migraine headache. You may see wavy or jagged lines, dots, or flashing lights. You might experience tunnel vision or blind spots in one or both eyes. The aura can include visual or auditory hallucinations (something imagined). It may include disruptions in smell (such as strange odors), taste or touch. Other symptoms include:  Numbness.  A "pins and needles"  sensation.  Difficulty in recalling or speaking the correct word. These neurological events may last as long as 60 minutes. These symptoms will fade as the headache begins. Q: What is a trigger? A: Certain physical or environmental factors can lead to or "trigger" a migraine. These include:  Foods.  Hormonal changes.  Weather.  Stress. It is important to remember that triggers are different for everyone. To help prevent migraine attacks, you need to figure out which triggers affect you. Keep a headache diary. This is a good way to track triggers. The diary will help you talk to your healthcare professional about your condition. Q: Does weather affect migraines? A: Bright sunshine, hot, humid conditions, and drastic changes in barometric pressure may lead to, or "trigger," a migraine attack in some people. But  studies have shown that weather does not act as a trigger for everyone with migraines. Q: What is the link between migraine and hormones? A: Hormones start and regulate many of your body's functions. Hormones keep your body in balance within a constantly changing environment. The levels of hormones in your body are unbalanced at times. Examples are during menstruation, pregnancy, or menopause. That can lead to a migraine attack. In fact, about three quarters of all women with migraine report that their attacks are related to the menstrual cycle.  Q: Is there an increased risk of stroke for migraine sufferers? A: The likelihood of a migraine attack causing a stroke is very remote. That is not to say that migraine sufferers cannot have a stroke associated with their migraines. In persons under age 51, the most common associated factor for stroke is migraine headache. But over the course of a person's normal life span, the occurrence of migraine headache may actually be associated with a reduced risk of dying from cerebrovascular disease due to stroke.  Q: What are acute medications for migraine? A: Acute medications are used to treat the pain of the headache after it has started. Examples over-the-counter medications, NSAIDs, ergots, and triptans.  Q: What are the triptans? A: Triptans are the newest class of abortive medications. They are specifically targeted to treat migraine. Triptans are vasoconstrictors. They moderate some chemical reactions in the brain. The triptans work on receptors in your brain. Triptans help to restore the balance of a neurotransmitter called serotonin. Fluctuations in levels of serotonin are thought to be a main cause of migraine.  Q: Are over-the-counter medications for migraine effective? A: Over-the-counter, or "OTC," medications may be effective in relieving mild to moderate pain and associated symptoms of migraine. But you should see your caregiver before beginning any  treatment regimen for migraine.  Q: What are preventive medications for migraine? A: Preventive medications for migraine are sometimes referred to as "prophylactic" treatments. They are used to reduce the frequency, severity, and length of migraine attacks. Examples of preventive medications include antiepileptic medications, antidepressants, beta-blockers, calcium channel blockers, and NSAIDs (nonsteroidal anti-inflammatory drugs). Q: Why are anticonvulsants used to treat migraine? A: During the past few years, there has been an increased interest in antiepileptic drugs for the prevention of migraine. They are sometimes referred to as "anticonvulsants". Both epilepsy and migraine may be caused by similar reactions in the brain.  Q: Why are antidepressants used to treat migraine? A: Antidepressants are typically used to treat people with depression. They may reduce migraine frequency by regulating chemical levels, such as serotonin, in the brain.  Q: What alternative therapies are used to treat migraine? A: The term "alternative therapies" is  often used to describe treatments considered outside the scope of conventional Western medicine. Examples of alternative therapy include acupuncture, acupressure, and yoga. Another common alternative treatment is herbal therapy. Some herbs are believed to relieve headache pain. Always discuss alternative therapies with your caregiver before proceeding. Some herbal products contain arsenic and other toxins. TENSION HEADACHES Q: What is a tension-type headache? What causes it? How can I treat it? A: Tension-type headaches occur randomly. They are often the result of temporary stress, anxiety, fatigue, or anger. Symptoms include soreness in your temples, a tightening band-like sensation around your head (a "vice-like" ache). Symptoms can also include a pulling feeling, pressure sensations, and contracting head and neck muscles. The headache begins in your forehead,  temples, or the back of your head and neck. Treatment for tension-type headache may include over-the-counter or prescription medications. Treatment may also include self-help techniques such as relaxation training and biofeedback. CLUSTER HEADACHES Q: What is a cluster headache? What causes it? How can I treat it? A: Cluster headache gets its name because the attacks come in groups. The pain arrives with little, if any, warning. It is usually on one side of the head. A tearing or bloodshot eye and a runny nose on the same side of the headache may also accompany the pain. Cluster headaches are believed to be caused by chemical reactions in the brain. They have been described as the most severe and intense of any headache type. Treatment for cluster headache includes prescription medication and oxygen. SINUS HEADACHES Q: What is a sinus headache? What causes it? How can I treat it? A: When a cavity in the bones of the face and skull (a sinus) becomes inflamed, the inflammation will cause localized pain. This condition is usually the result of an allergic reaction, a tumor, or an infection. If your headache is caused by a sinus blockage, such as an infection, you will probably have a fever. An x-ray will confirm a sinus blockage. Your caregiver's treatment might include antibiotics for the infection, as well as antihistamines or decongestants.  REBOUND HEADACHES Q: What is a rebound headache? What causes it? How can I treat it? A: A pattern of taking acute headache medications too often can lead to a condition known as "rebound headache." A pattern of taking too much headache medication includes taking it more than 2 days per week or in excessive amounts. That means more than the label or a caregiver advises. With rebound headaches, your medications not only stop relieving pain, they actually begin to cause headaches. Doctors treat rebound headache by tapering the medication that is being overused. Sometimes  your caregiver will gradually substitute a different type of treatment or medication. Stopping may be a challenge. Regularly overusing a medication increases the potential for serious side effects. Consult a caregiver if you regularly use headache medications more than 2 days per week or more than the label advises. ADDITIONAL QUESTIONS AND ANSWERS Q: What is biofeedback? A: Biofeedback is a self-help treatment. Biofeedback uses special equipment to monitor your body's involuntary physical responses. Biofeedback monitors:  Breathing.  Pulse.  Heart rate.  Temperature.  Muscle tension.  Brain activity. Biofeedback helps you refine and perfect your relaxation exercises. You learn to control the physical responses that are related to stress. Once the technique has been mastered, you do not need the equipment any more. Q: Are headaches hereditary? A: Four out of five (80%) of people that suffer report a family history of migraine. Scientists are not sure if  this is genetic or a family predisposition. Despite the uncertainty, a child has a 50% chance of having migraine if one parent suffers. The child has a 75% chance if both parents suffer.  Q: Can children get headaches? A: By the time they reach high school, most young people have experienced some type of headache. Many safe and effective approaches or medications can prevent a headache from occurring or stop it after it has begun.  Q: What type of doctor should I see to diagnose and treat my headache? A: Start with your primary caregiver. Discuss his or her experience and approach to headaches. Discuss methods of classification, diagnosis, and treatment. Your caregiver may decide to recommend you to a headache specialist, depending upon your symptoms or other physical conditions. Having diabetes, allergies, etc., may require a more comprehensive and inclusive approach to your headache. The National Headache Foundation will provide, upon request,  a list of [facility] physician members in your state. Document Released: 03/11/2003 Document Revised: 03/13/2011 Document Reviewed: 08/19/2007 [facility] Patient Information 2015 [place], [place]. This information is not intended to replace advice given to you by your health care provider. Make sure you discuss any questions you have with your health care provider.   Nausea, Adult Nausea is the feeling that you have an upset stomach or have to vomit. Nausea by itself is not likely a serious concern, but it may be an early sign of more serious medical problems. As nausea gets worse, it can lead to vomiting. If vomiting develops, there is the risk of dehydration.  CAUSES   Viral infections.  Food poisoning.  Medicines.  Pregnancy.  Motion sickness.  Migraine headaches.  Emotional distress.  Severe pain from any source.  Alcohol intoxication. HOME CARE INSTRUCTIONS  Get plenty of rest.  Ask your caregiver about specific rehydration instructions.  Eat small amounts of food and sip liquids more often.  Take all medicines as told by your caregiver. SEEK MEDICAL CARE IF:  You have not improved after 2 days, or you get worse.  You have a headache. SEEK IMMEDIATE MEDICAL CARE IF:   You have a fever.  You faint.  You keep vomiting or have blood in your vomit.  You are extremely weak or dehydrated.  You have dark or bloody stools.  You have severe chest or abdominal pain. MAKE SURE YOU:  Understand these instructions.  Will watch your condition.  Will get help right away if you are not doing well or get worse. Document Released: 01/27/2004 Document Revised: 09/13/2011 Document Reviewed: 08/31/2010 [facility] Patient Information 2015 [place], [place]. This information is not intended to replace advice given to you by your health care provider. Make sure you discuss any questions you have with your health care provider.   Emergency Department Resource Guide 1) Find a Doctor and  Pay Out of Pocket Although you won't have to find out who is covered by your insurance plan, it is a good idea to ask around and get recommendations. You will then need to call the office and see if the doctor you have chosen will accept you as a new patient and what types of options they offer for patients who are self-pay. Some doctors offer discounts or will set up payment plans for their patients who do not have insurance, but you will need to ask so you aren't surprised when you get to your appointment.  2) Contact Your Local Health Department Not all health departments have doctors that can see patients for sick  visits, but many do, so it is worth a call to see if yours does. If you don't know where your local health department is, you can check in your phone book. The CDC also has a tool to help you locate your state's health department, and many state websites also have listings of all of their local health departments.  3) Find a Walk-in Clinic If your illness is not likely to be very severe or complicated, you may want to try a walk in clinic. These are popping up all over the country in pharmacies, drugstores, and shopping centers. They're usually staffed by nurse practitioners or physician assistants that have been trained to treat common illnesses and complaints. They're usually fairly quick and inexpensive. However, if you have serious medical issues or chronic medical problems, these are probably not your best option.  No Primary Care Doctor: - Call [facility] at  [phone] - they can help you locate a primary care doctor that  accepts your insurance, provides certain services, etc. - Physician Referral Service- [phone]  Chronic Pain Problems: Organization         Address  Phone   Notes  [facility]  [phone] Patients need to be referred by their primary care doctor.   Medication Assistance: Organization         Address  Phone   Notes  [facility] [address] [place], [place] [address] [phone] --Must be a resident of [facility] -- Must have NO insurance coverage whatsoever (no Medicaid/ Medicare, etc.) -- The pt. MUST have a primary care doctor that directs their care regularly and follows them in the community   [facility]  [phone]   [employer]  [phone]    Agencies that provide inexpensive medical care: Organization         Address  Phone   Notes  [facility]  [phone]   [facility]    [phone]   [facility] [address] [place], [place] [address] [phone]   [facility] [address] [place], [place] [phone]   [facility]    [phone]   [facility]    [phone]   [facility] [facility]  [address], [place] Phone:  [phone], Fax:  [phone] Hours of Operation:  9 am - 6 pm, M-F.  Also accepts Medicaid/Medicare and self-pay.  [facility]  [address], [place] Phone: [phone], Fax: [phone]. Hours of Operation:  8:30 am - 5:30 pm, M-F.  Also accepts Medicaid and self-pay.  [facility] [place] [address] [place] Phone: [phone]   [facility] [address] Natasha Bence [place], [place] [phone], Ext. [phone] Mondays & Thursdays: 7-9 AM.  First 15 patients are seen on a first come, first serve basis.    Medicaid-accepting [facility] Providers:  Organization         Address  Phone   Notes  [facility] [address], [place] [phone] Also accepts self-pay patients.  [facility] [address] Laurell Josephs [place], [place]  [phone]   [facility] [address], [place] [phone]   [facility] [address], [place] [phone]  Renaye Rakers [address], [place]   [phone] Only accepts [place] patients after they have their name applied to their card.   Self-Pay (no insurance) in [facility] Aff[facility]ate:  Organization         Address  Phone   Notes  Sickle Cell Patients, [facility] [address] [place], [place] [phone]   [facility] [address] [place], [place] [phone]   [facility] [place]  [address], [place] [phone]   [facility]/Dr. Osei-Bonsu  [address] [place] or [address], [place] [phone] Phone number for both [place] and [place] locations is the same.  [facility] [address] [place] [phone]   [facility] [address] [place] or [address] [phone]   [facility] [address] [place] [phone], phone; [phone], fax Sees patients 1st and 3rd Saturday of every month.  Must not qualify for public or private insurance (i.e. Medicaid, Medicare, [place] Health Choice, Veterans' Benefits)  Household income should be no more than 200% of the poverty level The clinic cannot treat you if you are pregnant or think you are pregnant  Sexually transmitted diseases are not treated at the clinic.    Dental Care: Organization         Address  Phone  Notes  [facility] Department of [facility] [address] [place], [place] [phone] Accepts children up to age 68 who are enrolled in [place] or [place] Health Choice; pregnant women with a Medicaid card; and children who have applied for Medicaid or [place] Health Choice, but were declined, whose parents can pay a reduced fee at time of service.  [facility] Department of [facility]  [address], [place] [phone] Accepts children up to age 70 who are enrolled in [place] or [place] Health Choice; pregnant women with a Medicaid card; and children who have applied for Medicaid or  Health Choice, but were declined, whose parents can pay a reduced fee at time of service.  [facility]  [address] [place], [place] [phone] Patients are seen by appointment only. Walk-ins are not accepted. [facility] will see patients 76 years of age and older. Monday - Tuesday (8am-5pm) Most Wednesdays (8:30-5pm) $30 per visit, cash only  [facility]  [address], [facility] [phone] Patients are seen by appointment only. Walk-ins are not accepted. [facility] will see patients 44 years of age and older. One Wednesday Evening (Monthly: Volunteer Based).  $30 per visit, cash only  [employer]  [phone] for adults; Children under age 15, call Graduate Pediatric Dentistry at [phone]. Children aged 77-14, please call [phone] to request a pediatric application.  Dental services are provided in all areas of dental care including fillings, crowns and bridges, complete and partial dentures, implants, gum treatment, root canals, and extractions. Preventive care is also provided. Treatment is provided to both adults and children. Patients are selected via a lottery and there is often a waiting list.   [facility] [address], [place]  [phone] [email or web address]   [facility] [address] [place], [place] [phone], Ext. [phone]  Second and Fourth Thursday of each month, opens at 6:30 AM; Clinic ends at 9 AM.  Patients are seen on a first-come first-served basis, and a limited number are seen during each clinic.   [facility]  [address] Ether Griffins [place], [place] [phone]   Eligibility Requirements You must have lived in [place], [place], or  [place] counties for at least the last three months.   You cannot be eligible for state or federal sponsored [place], including [employer], [place], or [employer].   You generally cannot be eligible for healthcare insurance through your employer.    How to apply: Eligibility screenings are held every Tuesday and Wednesday afternoon from 1:00 pm until 4:00 pm. You do not need an appointment for the interview!  [facility] [address], [place], [place] [phone]   [facility]  [phone]   [facility]  [phone]   [facility]  [phone]    Behavioral Health Resources in the Community: Intensive Outpatient Programs Organization         Address  Phone  Notes  [facility] [address] [place], [place] [phone]   [facility] Outpatient [address] [place], [place] [phone]   [facility] [address], [place], [place]  [phone]   [facility] [address],  [place], [place] [phone] or [phone]   Substance Abuse Resources Organization         Address  Phone  Notes  [facility]  [phone]   [facility]  [phone]   [facility] [place]  [phone]   Floydene Flock  [phone]   Residential & Outpatient Substance Abuse Program  [phone]   Psychological Services Organization         Address  Phone  Notes  [facility]  [phone]   [facility]  [phone]   [facility] [address] [place] [phone] or [phone]    Mobile Crisis Teams Organization         Address  Phone  Notes  [facility], Mobile Crisis Care Unit  [phone]   [facility]  [address] [place], [place] [phone]   Doristine Locks [address] [place] [phone]    Self-Help/Support Groups Organization         Address  Phone             Notes  [facility] [place] - variety of support groups  [phone]- [phone] Call for more information  Narcotics Anonymous (NA), [facility] [address], [employer] [place]  2 meetings at this location   Statistician         Address  Phone  Notes  [facility] [address],    [place]  [phone]   [facility]  [address] [place] [address], [place], [place] [phone]   [facility] [address] [place], [place] [phone] Admissions: 8am-3pm M-F  [facility] [address],    [place], [place] [phone]   [facility] [address] Starling Manns [place], [place] [phone]   [facility] [address]  [place], [place] [phone]   [facility] - Intensive Outpatient [address] Laurell Josephs [address], [place], [place] [phone]   [facility]  [address]  [place], [place] [phone] or [phone]   [facility] ([facility]) [address], [place], [place] [phone] Accepts Medicaid  [facility] [address],  [place] [phone] Substance Abuse/Addiction Treatment   [facility] Organization         Address  Phone  Notes  [facility]  [phone]   Angie Fava, PhD [address] Ervin Knack [place], [place]   [phone] or [phone]   [facility]   [address] [place], [place] [phone]   [facility] [address] [place], [place] [phone] Insurance/Medicaid/sponsorship through [facility] Families [address]                                    [place], [place] [phone] Therapy/tele-psych/case  [facility] [address], [place] [phone]     Dr. Lolly Mustache  [phone]   [facility] [place]  [employer] [facility] Dept. 1) [address] [place] 2) [address], [place] 3)  [address], [place] [phone]  [phone]   [facility] Child Abuse Hotline 936-120-1518 or [phone] (After Hours)

## 2014-08-22 ENCOUNTER — Emergency Department (HOSPITAL_COMMUNITY): Payer: Medicaid Other

## 2014-08-22 ENCOUNTER — Emergency Department (HOSPITAL_COMMUNITY)
Admission: EM | Admit: 2014-08-22 | Discharge: 2014-08-22 | Disposition: A | Discharge status: Home / Self Care | Payer: Medicaid Other | Attending: Emergency Medicine | Admitting: Emergency Medicine

## 2014-08-22 ENCOUNTER — Encounter (HOSPITAL_COMMUNITY): Payer: Self-pay | Admitting: Emergency Medicine

## 2014-08-22 DIAGNOSIS — J40 Bronchitis, not specified as acute or chronic: Secondary | ICD-10-CM

## 2014-08-22 DIAGNOSIS — J45901 Unspecified asthma with (acute) exacerbation: Secondary | ICD-10-CM | POA: Insufficient documentation

## 2014-08-22 DIAGNOSIS — Z720 Tobacco use: Secondary | ICD-10-CM | POA: Insufficient documentation

## 2014-08-22 DIAGNOSIS — E6609 Other obesity due to excess calories: Secondary | ICD-10-CM | POA: Insufficient documentation

## 2014-08-22 MED ORDER — PREDNISONE 20 MG PO TABS: 40.0000 mg | ORAL_TABLET | Freq: Once | ORAL | Status: DC

## 2014-08-22 MED ORDER — PREDNISONE 20 MG PO TABS: 40.0000 mg | ORAL_TABLET | Freq: Every day | ORAL | Status: DC

## 2014-08-22 MED ORDER — BENZONATATE 100 MG PO CAPS: 100.0000 mg | ORAL_CAPSULE | Freq: Three times a day (TID) | ORAL | Status: DC

## 2014-08-22 NOTE — ED Provider Notes (Signed)
CSN: [id number]     Arrival date & time 08/22/14  0501 History   First MD Initiated Contact with Patient 08/22/14 [phone]     Chief Complaint  Patient presents with  . Shortness of Breath  . Cough     (Consider location/radiation/quality/duration/timing/severity/associated sxs/prior Treatment) HPI Comments: The patient is a 24 year old female, she has a history of asthma. She presents with a cough for the last couple of days, increase shortness of breath, she has been coughing more this morning. There is no fevers chills nausea or vomiting but she does note some change in the quality of her voice that she is having some hoarseness. There is no sore throat, no nasal congestion, no swelling of the legs. The symptoms are gradually worsening, persistent, over-the-counter medications and albuterol treatment given prior to arrival with minimal improvement.  Patient is a 24 y.o. female presenting with shortness of breath and cough. The history is provided by the patient.  Shortness of Breath Associated symptoms: cough   Cough Associated symptoms: shortness of breath     Past Medical History  Diagnosis Date  . Asthma   . Obesity    Past Surgical History  Procedure Laterality Date  . Wisdom tooth extraction     Family History  Problem Relation Age of Onset  . Hypertension Mother   . Sickle cell anemia Mother   . Diabetes Father   . Hypertension Father   . Diabetes Maternal Grandmother   . Diabetes Paternal Grandfather    Social History  Substance Use Topics  . Smoking status: Current Every Day Smoker -- 1.00 packs/day    Types: Cigarettes  . Smokeless tobacco: Never Used  . Alcohol Use: Yes     Comment: Occasional    OB History    Gravida Para Term Preterm AB TAB SAB Ectopic Multiple Living   0              Review of Systems  Respiratory: Positive for cough and shortness of breath.   All other systems reviewed and are negative.     Allergies  Review of patient's  allergies indicates no known allergies.  Home Medications   Prior to Admission medications   Medication Sig Start Date End Date Taking? Authorizing Provider  benzonatate (TESSALON) 100 MG capsule Take 1 capsule (100 mg total) by mouth every 8 (eight) hours. 08/22/14   Eber Hong, MD  ibuprofen (ADVIL,MOTRIN) 800 MG tablet Take 1 tablet (800 mg total) by mouth every 6 (six) hours as needed for moderate pain. Patient not taking: Reported on 08/22/2014 08/07/14   Lyndal Pulley, MD  predniSONE (DELTASONE) 20 MG tablet Take 2 tablets (40 mg total) by mouth daily. 08/22/14   Eber Hong, MD  simethicone (GAS-X) 80 MG chewable tablet Chew 1 tablet (80 mg total) by mouth every 6 (six) hours as needed for flatulence. Patient not taking: Reported on 07/13/2014 04/22/13   Derwood Kaplan, MD   BP 98/70 mmHg  Pulse 76  Temp(Src) 97.6 F (36.4 C) (Oral)  Resp 15  Ht  (1.626 m)  Wt 230 lb (104.327 kg)  BMI 39.46 kg/m2  SpO2 100%  LMP 08/07/2014 (Approximate) Physical Exam  Constitutional: She appears well-developed and well-nourished. No distress.  HENT:  Head: Normocephalic and atraumatic.  Mouth/Throat: Oropharynx is clear and moist. No oropharyngeal exudate.  Eyes: Conjunctivae and EOM are normal. Pupils are equal, round, and reactive to light. Right eye exhibits no discharge. Left eye exhibits no discharge. No scleral icterus.  Neck: Normal range of motion. Neck supple. No JVD present. No thyromegaly present.  Cardiovascular: Normal rate, regular rhythm, normal heart sounds and intact distal pulses.  Exam reveals no gallop and no friction rub.   No murmur heard. Pulmonary/Chest: Effort normal and breath sounds normal. No respiratory distress. She has no wheezes. She has no rales.  Abdominal: Soft. Bowel sounds are normal. She exhibits no distension and no mass. There is no tenderness.  Musculoskeletal: Normal range of motion. She exhibits no edema or tenderness.  Lymphadenopathy:    She has  no cervical adenopathy.  Neurological: She is alert. Coordination normal.  Skin: Skin is warm and dry. No rash noted. No erythema.  Psychiatric: She has a normal mood and affect. Her behavior is normal.  Nursing note and vitals reviewed.   ED Course  Procedures (including critical care time) Labs Review Labs Reviewed - No data to display  Imaging Review Dg Chest 2 View  08/22/2014   CLINICAL DATA:  Shortness of breath for 25 minutes, nonproductive cough for a few days. History of asthma.  EXAM: CHEST  2 VIEW  COMPARISON:  Chest radiograph April 22, 2013  FINDINGS: Cardiomediastinal silhouette is normal. The lungs are clear without pleural effusions or focal consolidations. Trachea projects midline and there is no pneumothorax. Soft tissue planes and included osseous structures are non-suspicious.  IMPRESSION: Normal chest.   Electronically Signed   By: Awilda Metro M.D.   On: 08/22/2014 06:02   I have personally reviewed and evaluated these images and lab results as part of my medical decision-making.    MDM   Final diagnoses:  Bronchitis    The patient has no wheezing, she does not cough during my interaction with her, her x-ray is negative, she will be safely discharged with medications as below, she is in agreement with the plan.  I have personally viewed and interpreted the imaging and agree with radiologist interpretation.   Meds given in ED:  Medications - No data to display  New Prescriptions   BENZONATATE (TESSALON) 100 MG CAPSULE    Take 1 capsule (100 mg total) by mouth every 8 (eight) hours.   PREDNISONE (DELTASONE) 20 MG TABLET    Take 2 tablets (40 mg total) by mouth daily.        Eber Hong, MD 08/22/14 [phone]

## 2014-08-22 NOTE — ED Notes (Signed)
Pt arrived to room carrying large pillow and blanket, immediately upon entering she turned the lights off and laid down for nap.

## 2014-08-22 NOTE — ED Notes (Signed)
Awake. Verbally responsive. A/O x4. Resp even and unlabored. No audible adventitious breath sounds noted. ABC's intact.  

## 2014-08-22 NOTE — Discharge Instructions (Signed)

## 2014-08-22 NOTE — ED Notes (Addendum)
Pt from home c/o shortness of breath x 25 minutes and a non productive cough x few days. Lungs sounds clear bilaterally.  Pt has hoarse voice. Denies pain.Pt appears disheveled.  Pt denies SI or HI.

## 2014-08-26 ENCOUNTER — Encounter (HOSPITAL_COMMUNITY): Payer: Self-pay

## 2014-08-26 ENCOUNTER — Emergency Department (HOSPITAL_COMMUNITY): Payer: Medicaid Other

## 2014-08-26 ENCOUNTER — Emergency Department (HOSPITAL_COMMUNITY)
Admission: EM | Admit: 2014-08-26 | Discharge: 2014-08-26 | Disposition: A | Discharge status: Home / Self Care | Payer: Medicaid Other | Attending: Emergency Medicine | Admitting: Emergency Medicine

## 2014-08-26 DIAGNOSIS — R059 Cough, unspecified: Secondary | ICD-10-CM

## 2014-08-26 DIAGNOSIS — Y9389 Activity, other specified: Secondary | ICD-10-CM | POA: Insufficient documentation

## 2014-08-26 DIAGNOSIS — S29001A Unspecified injury of muscle and tendon of front wall of thorax, initial encounter: Secondary | ICD-10-CM | POA: Insufficient documentation

## 2014-08-26 DIAGNOSIS — J45901 Unspecified asthma with (acute) exacerbation: Secondary | ICD-10-CM | POA: Insufficient documentation

## 2014-08-26 DIAGNOSIS — R202 Paresthesia of skin: Secondary | ICD-10-CM

## 2014-08-26 DIAGNOSIS — Y92410 Unspecified street and highway as the place of occurrence of the external cause: Secondary | ICD-10-CM | POA: Insufficient documentation

## 2014-08-26 DIAGNOSIS — R0602 Shortness of breath: Secondary | ICD-10-CM

## 2014-08-26 DIAGNOSIS — Z720 Tobacco use: Secondary | ICD-10-CM | POA: Insufficient documentation

## 2014-08-26 DIAGNOSIS — R05 Cough: Secondary | ICD-10-CM | POA: Insufficient documentation

## 2014-08-26 DIAGNOSIS — E669 Obesity, unspecified: Secondary | ICD-10-CM | POA: Insufficient documentation

## 2014-08-26 DIAGNOSIS — M25511 Pain in right shoulder: Secondary | ICD-10-CM

## 2014-08-26 DIAGNOSIS — M62838 Other muscle spasm: Secondary | ICD-10-CM | POA: Insufficient documentation

## 2014-08-26 DIAGNOSIS — Y998 Other external cause status: Secondary | ICD-10-CM | POA: Insufficient documentation

## 2014-08-26 DIAGNOSIS — R0789 Other chest pain: Secondary | ICD-10-CM

## 2014-08-26 LAB — I-STAT TROPONIN, ED: Troponin i, poc: 0 ng/mL (ref 0.00–0.08)

## 2014-08-26 LAB — CBC WITH DIFFERENTIAL/PLATELET
Basophils Absolute: 0 10*3/uL (ref 0.0–0.1)
Basophils Relative: 0 % (ref 0–1)
EOS PCT: 3 % (ref 0–5)
Eosinophils Absolute: 0.2 10*3/uL (ref 0.0–0.7)
HCT: 40.2 % (ref 36.0–46.0)
Hemoglobin: 13.2 g/dL (ref 12.0–15.0)
LYMPHS ABS: 3.9 10*3/uL (ref 0.7–4.0)
LYMPHS PCT: 43 % (ref 12–46)
MCH: 26.6 pg (ref 26.0–34.0)
MCHC: 32.8 g/dL (ref 30.0–36.0)
MCV: 81 fL (ref 78.0–100.0)
Monocytes Absolute: 0.5 10*3/uL (ref 0.1–1.0)
Monocytes Relative: 6 % (ref 3–12)
Neutro Abs: 4.4 10*3/uL (ref 1.7–7.7)
Neutrophils Relative %: 48 % (ref 43–77)
PLATELETS: 350 10*3/uL (ref 150–400)
RBC: 4.96 MIL/uL (ref 3.87–5.11)
RDW: 15 % (ref 11.5–15.5)
WBC: 9 10*3/uL (ref 4.0–10.5)

## 2014-08-26 LAB — I-STAT CHEM 8, ED
BUN: 16 mg/dL (ref 6–20)
CALCIUM ION: 1.2 mmol/L (ref 1.12–1.23)
Chloride: 104 mmol/L (ref 101–111)
Creatinine, Ser: 0.7 mg/dL (ref 0.44–1.00)
Glucose, Bld: 85 mg/dL (ref 65–99)
HEMATOCRIT: 42 % (ref 36.0–46.0)
HEMOGLOBIN: 14.3 g/dL (ref 12.0–15.0)
Potassium: 4 mmol/L (ref 3.5–5.1)
Sodium: 140 mmol/L (ref 135–145)
TCO2: 23 mmol/L (ref 0–100)

## 2014-08-26 MED ORDER — CYCLOBENZAPRINE HCL 10 MG PO TABS
5.0000 mg | ORAL_TABLET | Freq: Once | ORAL | Status: AC
  Administered 2014-08-26: 5 mg via ORAL
  Filled 2014-08-26: qty 1

## 2014-08-26 MED ORDER — CYCLOBENZAPRINE HCL 10 MG PO TABS: 10.0000 mg | ORAL_TABLET | Freq: Three times a day (TID) | ORAL | Status: DC | PRN

## 2014-08-26 MED ORDER — NAPROXEN 500 MG PO TABS: 500.0000 mg | ORAL_TABLET | Freq: Two times a day (BID) | ORAL | Status: DC | PRN

## 2014-08-26 MED ORDER — HYDROCODONE-ACETAMINOPHEN 5-325 MG PO TABS
1.0000 | ORAL_TABLET | Freq: Once | ORAL | Status: AC
  Administered 2014-08-26: 1 via ORAL
  Filled 2014-08-26: qty 1

## 2014-08-26 MED ORDER — HYDROCODONE-ACETAMINOPHEN 5-325 MG PO TABS: 1.0000 | ORAL_TABLET | Freq: Four times a day (QID) | ORAL | Status: DC | PRN

## 2014-08-26 NOTE — ED Notes (Signed)
Pt c/o increasing R shoulder pain and numbness x 5-7 days.  Pain score 10/10.  Denies injury.  Sts reports that she was riding in a car w/ pain began.

## 2014-08-26 NOTE — Discharge Instructions (Signed)
Take naprosyn as directed for inflammation and pain with norco for breakthrough pain and flexeril for muscle relaxation. Do not drive or operate machinery with pain medication or muscle relaxation use. Use heat to areas of pain, no more than 20 minutes at a time every hour. Fill the prednisone prescribed to you at your last ER visit, this will help with your symptoms. Stay well hydrated. Use your home inhaler as needed for shortness of breath/cough. Follow up with [facility] for recheck of ongoing symptoms and to establish medical care in the next 1-2 weeks. Return to ER for emergent changing or worsening of symptoms.     Chest Wall Pain Chest wall pain is pain in or around the bones and muscles of your chest. It may take up to 6 weeks to get better. It may take longer if you must stay physically active in your work and activities.  CAUSES  Chest wall pain may happen on its own. However, it may be caused by:  A viral illness like the flu.  Injury.  Coughing.  Exercise.  Arthritis.  Fibromyalgia.  Shingles. HOME CARE INSTRUCTIONS   Avoid overtiring physical activity. Try not to strain or perform activities that cause pain. This includes any activities using your chest or your abdominal and side muscles, especially if heavy weights are used.  Put ice on the sore area.  Put ice in a plastic bag.  Place a towel between your skin and the bag.  Leave the ice on for 15-20 minutes per hour while awake for the first 2 days.  Only take over-the-counter or prescription medicines for pain, discomfort, or fever as directed by your caregiver. SEEK IMMEDIATE MEDICAL CARE IF:   Your pain increases, or you are very uncomfortable.  You have a fever.  Your chest pain becomes worse.  You have new, unexplained symptoms.  You have nausea or vomiting.  You feel sweaty or lightheaded.  You have a cough with phlegm (sputum), or you cough up blood. MAKE SURE YOU:   Understand  these instructions.  Will watch your condition.  Will get help right away if you are not doing well or get worse. Document Released: 12/19/2004 Document Revised: 03/13/2011 Document Reviewed: 08/15/2010 [facility] Patient Information 2015 [place], [place]. This information is not intended to replace advice given to you by your health care provider. Make sure you discuss any questions you have with your health care provider.  Muscle Cramps and Spasms Muscle cramps and spasms are when muscles tighten by themselves. They usually get better within minutes. Muscle cramps are painful. They are usually stronger and last longer than muscle spasms. Muscle spasms may or may not be painful. They can last a few seconds or much longer. HOME CARE  Drink enough fluid to keep your pee (urine) clear or pale yellow.  Massage, stretch, and relax the muscle.  Use a warm towel, heating pad, or warm shower water on tight muscles.  Place ice on the muscle if it is tender or in pain.  Put ice in a plastic bag.  Place a towel between your skin and the bag.  Leave the ice on for 15-20 minutes, 03-04 times a day.  Only take medicine as told by your doctor. GET HELP RIGHT AWAY IF:  Your cramps or spasms get worse, happen more often, or do not get better with time. MAKE SURE YOU:  Understand these instructions.  Will watch your condition.  Will get help right away if you are  not doing well or get worse. Document Released: 12/02/2007 Document Revised: 04/15/2012 Document Reviewed: 12/06/2011 [facility] Patient Information 2015 [place], [place]. This information is not intended to replace advice given to you by your health care provider. Make sure you discuss any questions you have with your health care provider.  Paresthesia Paresthesia is an abnormal burning or prickling sensation. This sensation is generally felt in the hands, arms, legs, or feet. However, it may occur in any part of the body. It is usually  not painful. The feeling may be described as:  Tingling or numbness.  "Pins and needles."  Skin crawling.  Buzzing.  Limbs "falling asleep."  Itching. Most people experience temporary (transient) paresthesia at some time in their lives. CAUSES  Paresthesia may occur when you breathe too quickly (hyperventilation). It can also occur without any apparent cause. Commonly, paresthesia occurs when pressure is placed on a nerve. The feeling quickly goes away once the pressure is removed. For some people, however, paresthesia is a long-lasting (chronic) condition caused by an underlying disorder. The underlying disorder may be:  A traumatic, direct injury to nerves. Examples include a:  Broken (fractured) neck.  Fractured skull.  A disorder affecting the brain and spinal cord (central nervous system). Examples include:  Transverse myelitis.  Encephalitis.  Transient ischemic attack.  Multiple sclerosis.  Stroke.  Tumor or blood vessel problems, such as an arteriovenous malformation pressing against the brain or spinal cord.  A condition that damages the peripheral nerves (peripheral neuropathy). Peripheral nerves are not part of the brain and spinal cord. These conditions include:  Diabetes.  Peripheral vascular disease.  Nerve entrapment syndromes, such as carpal tunnel syndrome.  Shingles.  Hypothyroidism.  Vitamin B12 deficiencies.  Alcoholism.  Heavy metal poisoning (lead, arsenic).  Rheumatoid arthritis.  Systemic lupus erythematosus. DIAGNOSIS  Your caregiver will attempt to find the underlying cause of your paresthesia. Your caregiver may:  Take your medical history.  Perform a physical exam.  Order various lab tests.  Order imaging tests. TREATMENT  Treatment for paresthesia depends on the underlying cause. HOME CARE INSTRUCTIONS  Avoid drinking alcohol.  You may consider massage or acupuncture to help relieve your symptoms.  Keep all  follow-up appointments as directed by your caregiver. SEEK IMMEDIATE MEDICAL CARE IF:   You feel weak.  You have trouble walking or moving.  You have problems with speech or vision.  You feel confused.  You cannot control your bladder or bowel movements.  You feel numbness after an injury.  You faint.  Your burning or prickling feeling gets worse when walking.  You have pain, cramps, or dizziness.  You develop a rash. MAKE SURE YOU:  Understand these instructions.  Will watch your condition.  Will get help right away if you are not doing well or get worse. Document Released: 12/09/2001 Document Revised: 03/13/2011 Document Reviewed: 09/09/2010 [facility] Patient Information 2015 [place], [place]. This information is not intended to replace advice given to you by your health care provider. Make sure you discuss any questions you have with your health care provider.  Shoulder Pain The shoulder is the joint that connects your arm to your body. Muscles and band-like tissues that connect bones to muscles (tendons) hold the joint together. Shoulder pain is felt if an injury or medical problem affects one or more parts of the shoulder. HOME CARE   Put ice on the sore area.  Put ice in a plastic bag.  Place a towel between your skin and the bag.  Leave the ice on for 15-20 minutes, 03-04 times a day for the first 2 days.  Stop using cold packs if they do not help with the pain.  If you were given something to keep your shoulder from moving (sling; shoulder immobilizer), wear it as told. Only take it off to shower or bathe.  Move your arm as little as possible, but keep your hand moving to prevent puffiness (swelling).  Squeeze a soft ball or foam pad as much as possible to help prevent swelling.  Take medicine as told by your doctor. GET HELP IF:  You have progressing new pain in your arm, hand, or fingers.  Your hand or fingers get cold.  Your medicine does not help  lessen your pain. GET HELP RIGHT AWAY IF:   Your arm, hand, or fingers are numb or tingling.  Your arm, hand, or fingers are puffy (swollen), painful, or turn white or blue. MAKE SURE YOU:   Understand these instructions.  Will watch your condition.  Will get help right away if you are not doing well or get worse. Document Released: 06/07/2007 Document Revised: 05/05/2013 Document Reviewed: 07/03/2011 [facility] Patient Information 2015 [place], [place]. This information is not intended to replace advice given to you by your health care provider. Make sure you discuss any questions you have with your health care provider.  Heat Therapy Heat therapy can help ease sore, stiff, injured, and tight muscles and joints. Heat relaxes your muscles, which may help ease your pain.  RISKS AND COMPLICATIONS If you have any of the following conditions, do not use heat therapy unless your health care provider has approved:  Poor circulation.  Healing wounds or scarred skin in the area being treated.  Diabetes, heart disease, or high blood pressure.  Not being able to feel (numbness) the area being treated.  Unusual swelling of the area being treated.  Active infections.  Blood clots.  Cancer.  Inability to communicate pain. This may include young children and people who have problems with their brain function (dementia).  Pregnancy. Heat therapy should only be used on old, pre-existing, or long-lasting (chronic) injuries. Do not use heat therapy on new injuries unless directed by your health care provider. HOW TO USE HEAT THERAPY There are several different kinds of heat therapy, including:  Moist heat pack.  Warm water bath.  Hot water bottle.  Electric heating pad.  Heated gel pack.  Heated wrap.  Electric heating pad. Use the heat therapy method suggested by your health care provider. Follow your health care provider's instructions on when and how to use heat  therapy. GENERAL HEAT THERAPY RECOMMENDATIONS  Do not sleep while using heat therapy. Only use heat therapy while you are awake.  Your skin may turn pink while using heat therapy. Do not use heat therapy if your skin turns red.  Do not use heat therapy if you have new pain.  High heat or long exposure to heat can cause burns. Be careful when using heat therapy to avoid burning your skin.  Do not use heat therapy on areas of your skin that are already irritated, such as with a rash or sunburn. SEEK MEDICAL CARE IF:  You have blisters, redness, swelling, or numbness.  You have new pain.  Your pain is worse. MAKE SURE YOU:  Understand these instructions.  Will watch your condition.  Will get help right away if you are not doing well or get worse. Document Released: 03/13/2011 Document Revised: 05/05/2013 Document Reviewed:  02/11/2013 [employer] Patient Information 2015 [place], [place]. This information is not intended to replace advice given to you by your health care provider. Make sure you discuss any questions you have with your health care provider.

## 2014-08-26 NOTE — ED Notes (Addendum)
Pt comes in today with a c/o right shoulder pain/numbness and bilat leg tingling. Pt states that this started 5 days ago. Pt denies any trauma to her shoulder or legs. Pt states that she did try Icy hot on her shoulder with some relief.
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
## 2014-08-31 ENCOUNTER — Emergency Department (HOSPITAL_COMMUNITY)
Admission: EM | Admit: 2014-08-31 | Discharge: 2014-08-31 | Discharge status: Against Medical Advice | Payer: Medicaid Other | Attending: Emergency Medicine | Admitting: Emergency Medicine

## 2014-08-31 ENCOUNTER — Encounter (HOSPITAL_COMMUNITY): Payer: Self-pay | Admitting: Emergency Medicine

## 2014-08-31 ENCOUNTER — Encounter (HOSPITAL_COMMUNITY): Payer: Self-pay | Admitting: *Deleted

## 2014-08-31 ENCOUNTER — Emergency Department (HOSPITAL_COMMUNITY)
Admission: EM | Admit: 2014-08-31 | Discharge: 2014-08-31 | Disposition: A | Discharge status: Home / Self Care | Payer: Medicaid Other | Attending: Emergency Medicine | Admitting: Emergency Medicine

## 2014-08-31 DIAGNOSIS — W25XXXA Contact with sharp glass, initial encounter: Secondary | ICD-10-CM | POA: Insufficient documentation

## 2014-08-31 DIAGNOSIS — S80211A Abrasion, right knee, initial encounter: Secondary | ICD-10-CM | POA: Insufficient documentation

## 2014-08-31 DIAGNOSIS — T07XXXA Unspecified multiple injuries, initial encounter: Secondary | ICD-10-CM

## 2014-08-31 DIAGNOSIS — S61202A Unspecified open wound of right middle finger without damage to nail, initial encounter: Secondary | ICD-10-CM | POA: Insufficient documentation

## 2014-08-31 DIAGNOSIS — Y9301 Activity, walking, marching and hiking: Secondary | ICD-10-CM | POA: Insufficient documentation

## 2014-08-31 DIAGNOSIS — Y9389 Activity, other specified: Secondary | ICD-10-CM | POA: Insufficient documentation

## 2014-08-31 DIAGNOSIS — S80212A Abrasion, left knee, initial encounter: Secondary | ICD-10-CM | POA: Insufficient documentation

## 2014-08-31 DIAGNOSIS — Z720 Tobacco use: Secondary | ICD-10-CM | POA: Insufficient documentation

## 2014-08-31 DIAGNOSIS — E669 Obesity, unspecified: Secondary | ICD-10-CM | POA: Insufficient documentation

## 2014-08-31 DIAGNOSIS — Y998 Other external cause status: Secondary | ICD-10-CM | POA: Insufficient documentation

## 2014-08-31 DIAGNOSIS — J45909 Unspecified asthma, uncomplicated: Secondary | ICD-10-CM | POA: Insufficient documentation

## 2014-08-31 DIAGNOSIS — X58XXXA Exposure to other specified factors, initial encounter: Secondary | ICD-10-CM | POA: Insufficient documentation

## 2014-08-31 DIAGNOSIS — Y9289 Other specified places as the place of occurrence of the external cause: Secondary | ICD-10-CM | POA: Insufficient documentation

## 2014-08-31 DIAGNOSIS — S61212A Laceration without foreign body of right middle finger without damage to nail, initial encounter: Secondary | ICD-10-CM | POA: Insufficient documentation

## 2014-08-31 DIAGNOSIS — Y999 Unspecified external cause status: Secondary | ICD-10-CM | POA: Insufficient documentation

## 2014-08-31 MED ORDER — BACITRACIN ZINC 500 UNIT/GM EX OINT
TOPICAL_OINTMENT | Freq: Two times a day (BID) | CUTANEOUS | Status: DC
  Administered 2014-08-31: 1 via TOPICAL
  Filled 2014-08-31: qty 1.8
  Filled 2014-08-31: qty 0.9

## 2014-08-31 NOTE — ED Notes (Addendum)
Patient escorted out by [employer] after receiving discharge instructions. Patient refused to sign for discharge.

## 2014-08-31 NOTE — ED Notes (Signed)
Patient now states that she wants to be check for STDs "everything. Patient denies sign, symptoms, or being informed by a partner that she has been exposed. She wants to be checked just because.

## 2014-08-31 NOTE — ED Notes (Signed)
Patient informed that "just because" is not medical justification for random testing. The author's assessment nor the patient's responses to pertinent questions supports the patient's demand. Patient also made aware of other alternatives to emergency if signs or symptoms arise or if she believes she has been exposed. Patient states, " I will be checked, I'm not going anywhere. Well tell the doctor I'm having discharged then". Patient made aware that her responses had been documented and that the attending has been made aware.

## 2014-08-31 NOTE — ED Provider Notes (Signed)
CSN: [id number]     Arrival date & time 08/31/14  0321 History  This chart was scribed for non-physician practitioner, Earley Favor, NP working with Tomasita Crumble, MD by Freida Busman, ED Scribe. This patient was seen in room WA11/WA11 and the patient's care was started at 3:46 AM.    Chief Complaint  Patient presents with  . Fall  . Extremity Laceration    The history is provided by the patient.     HPI Comments:  Tamara Bryant is a 24 y.o. female who presents to the Emergency Department complaining of abrasions to her bilateral knees s/p fall ~2-3 hours ago; states she tripped and scraped her knees on the ground. No LOC or head injury. Pt reports associated small laceration to her right middle finger following the incident; states she cut her finger on glass. Her tetanus  Is UTD (July 2016). Pt has been able to ambulate since the fall and notes she has not cleaned any of her wounds PTA. No modifying  factors noted.   Past Medical History  Diagnosis Date  . Asthma   . Obesity    Past Surgical History  Procedure Laterality Date  . Wisdom tooth extraction     Family History  Problem Relation Age of Onset  . Hypertension Mother   . Sickle cell anemia Mother   . Diabetes Father   . Hypertension Father   . Diabetes Maternal Grandmother   . Diabetes Paternal Grandfather    Social History  Substance Use Topics  . Smoking status: Current Every Day Smoker -- 1.00 packs/day    Types: Cigarettes  . Smokeless tobacco: Never Used  . Alcohol Use: Yes     Comment: Occasional    OB History    Gravida Para Term Preterm AB TAB SAB Ectopic Multiple Living   0              Review of Systems  Constitutional: Negative for fever and chills.  Skin: Positive for wound.  All other systems reviewed and are negative.     Allergies  Review of patient's allergies indicates no known allergies.  Home Medications   Prior to Admission medications   Medication Sig Start Date End Date  Taking? Authorizing Provider  benzonatate (TESSALON) 100 MG capsule Take 1 capsule (100 mg total) by mouth every 8 (eight) hours. Patient not taking: Reported on 08/26/2014 08/22/14   Eber Hong, MD  cyclobenzaprine (FLEXERIL) 10 MG tablet Take 1 tablet (10 mg total) by mouth 3 (three) times daily as needed for muscle spasms. Patient not taking: Reported on 08/31/2014 08/26/14   Mercedes Camprubi-Soms, PA-C  HYDROcodone-acetaminophen (NORCO) 5-325 MG per tablet Take 1 tablet by mouth every 6 (six) hours as needed for severe pain. Patient not taking: Reported on 08/31/2014 08/26/14   Mercedes Camprubi-Soms, PA-C  ibuprofen (ADVIL,MOTRIN) 800 MG tablet Take 1 tablet (800 mg total) by mouth every 6 (six) hours as needed for moderate pain. Patient not taking: Reported on 08/22/2014 08/07/14   Lyndal Pulley, MD  naproxen (NAPROSYN) 500 MG tablet Take 1 tablet (500 mg total) by mouth 2 (two) times daily as needed for mild pain, moderate pain or headache (TAKE WITH MEALS.). Patient not taking: Reported on 08/31/2014 08/26/14   Mercedes Camprubi-Soms, PA-C  predniSONE (DELTASONE) 20 MG tablet Take 2 tablets (40 mg total) by mouth daily. Patient not taking: Reported on 08/26/2014 08/22/14   Eber Hong, MD  simethicone (GAS-X) 80 MG chewable tablet Chew 1 tablet (80 mg  total) by mouth every 6 (six) hours as needed for flatulence. Patient not taking: Reported on 07/13/2014 04/22/13   Derwood Kaplan, MD   BP 138/84 mmHg  Pulse 102  Temp(Src) 98.2 F (36.8 C) (Oral)  Resp 18  SpO2 100%  LMP 08/07/2014 (Approximate) Physical Exam  Constitutional: She is oriented to person, place, and time. She appears well-developed and well-nourished. No distress.  HENT:  Head: Normocephalic and atraumatic.  Eyes: Conjunctivae are normal.  Neck: Normal range of motion.  Cardiovascular: Normal rate.   Pulmonary/Chest: Effort normal.  Musculoskeletal: Normal range of motion.  Neurological: She is alert and oriented to person,  place, and time.  Skin: Skin is warm and dry.  abrasions noted to both patella Linear superficial  lac DIP joint of right middle finger FROM no active bleeding  Psychiatric: She has a normal mood and affect.  Nursing note and vitals reviewed.   ED Course  Procedures   DIAGNOSTIC STUDIES:  Oxygen Saturation is 100% on RA, normal by my interpretation.    COORDINATION OF CARE:  3:49 AM Discussed treatment plan with pt at bedside and pt agreed to plan.  Labs Review Labs Reviewed - No data to display  Imaging Review No results found. I have personally reviewed and evaluated these images and lab results as part of my medical decision-making.   EKG Interpretation None     at time of discharge.  Patient informs the nurse that she wants to be checked for STD.  She is not having any symptoms but, since she's here.  She would like to get checked.  She has been referred to the health department  MDM   Final diagnoses:  Abrasions of multiple sites       I personally performed the services described in this documentation, which was scribed in my presence. The recorded information has been reviewed and is accurate.   Earley Favor, NP 08/31/14 0410  Tomasita Crumble, MD 08/31/14 [phone]

## 2014-08-31 NOTE — ED Notes (Signed)
Pt. tripped and fell while walking this evening , presents with bilateral knee abrasion .and skin tear at right middle finger . No LOC / ambulatory , pt. also requested STD screening .

## 2014-08-31 NOTE — ED Notes (Signed)
Pt sts she tripped and fell earlier this morning scrapping her knees and finger on her right hand. Pt was at [facility] ED earlier but left prior to being seen.

## 2014-08-31 NOTE — Discharge Instructions (Signed)
Abrasions An abrasion is a cut or scrape of the skin. Abrasions do not go through all layers of the skin. HOME CARE  If a bandage (dressing) was put on your wound, change it as told by your doctor. If the bandage sticks, soak it off with warm.  Wash the area with water and soap 2 times a day. Rinse off the soap. Pat the area dry with a clean towel.  Put on medicated cream (ointment) as told by your doctor.  Change your bandage right away if it gets wet or dirty.  Only take medicine as told by your doctor.  See your doctor within 24-48 hours to get your wound checked.  Check your wound for redness, puffiness (swelling), or yellowish-white fluid (pus). GET HELP RIGHT AWAY IF:   You have more pain in the wound.  You have redness, swelling, or tenderness around the wound.  You have pus coming from the wound.  You have a fever or lasting symptoms for more than 2-3 days.  You have a fever and your symptoms suddenly get worse.  You have a bad smell coming from the wound or bandage. MAKE SURE YOU:   Understand these instructions.  Will watch your condition.  Will get help right away if you are not doing well or get worse. Document Released: 06/07/2007 Document Revised: 09/13/2011 Document Reviewed: 11/22/2010 [facility] Patient Information 2015 [place], [place]. This information is not intended to replace advice given to you by your health care provider. Make sure you discuss any questions you have with your health care provider. You go to the health department to be checked for STDs

## 2015-05-30 ENCOUNTER — Encounter (HOSPITAL_COMMUNITY): Payer: Self-pay | Admitting: Emergency Medicine

## 2015-05-30 ENCOUNTER — Emergency Department (HOSPITAL_COMMUNITY)
Admission: EM | Admit: 2015-05-30 | Discharge: 2015-05-30 | Disposition: A | Discharge status: Home / Self Care | Payer: No Typology Code available for payment source | Attending: Emergency Medicine | Admitting: Emergency Medicine

## 2015-05-30 DIAGNOSIS — B9689 Other specified bacterial agents as the cause of diseases classified elsewhere: Secondary | ICD-10-CM | POA: Insufficient documentation

## 2015-05-30 DIAGNOSIS — B354 Tinea corporis: Secondary | ICD-10-CM

## 2015-05-30 DIAGNOSIS — K0889 Other specified disorders of teeth and supporting structures: Secondary | ICD-10-CM

## 2015-05-30 DIAGNOSIS — B373 Candidiasis of vulva and vagina: Secondary | ICD-10-CM

## 2015-05-30 DIAGNOSIS — N898 Other specified noninflammatory disorders of vagina: Secondary | ICD-10-CM | POA: Insufficient documentation

## 2015-05-30 DIAGNOSIS — E669 Obesity, unspecified: Secondary | ICD-10-CM | POA: Insufficient documentation

## 2015-05-30 DIAGNOSIS — F17210 Nicotine dependence, cigarettes, uncomplicated: Secondary | ICD-10-CM | POA: Insufficient documentation

## 2015-05-30 DIAGNOSIS — N760 Acute vaginitis: Secondary | ICD-10-CM

## 2015-05-30 DIAGNOSIS — Z79899 Other long term (current) drug therapy: Secondary | ICD-10-CM | POA: Insufficient documentation

## 2015-05-30 DIAGNOSIS — J45909 Unspecified asthma, uncomplicated: Secondary | ICD-10-CM | POA: Insufficient documentation

## 2015-05-30 DIAGNOSIS — B3731 Acute candidiasis of vulva and vagina: Secondary | ICD-10-CM

## 2015-05-30 DIAGNOSIS — Z792 Long term (current) use of antibiotics: Secondary | ICD-10-CM | POA: Insufficient documentation

## 2015-05-30 LAB — WET PREP, GENITAL
SPERM: NONE SEEN
TRICH WET PREP: NONE SEEN

## 2015-05-30 LAB — URINALYSIS, ROUTINE W REFLEX MICROSCOPIC
BILIRUBIN URINE: NEGATIVE
GLUCOSE, UA: NEGATIVE mg/dL
Ketones, ur: NEGATIVE mg/dL
Nitrite: NEGATIVE
PH: 5.5 (ref 5.0–8.0)
Protein, ur: NEGATIVE mg/dL
SPECIFIC GRAVITY, URINE: 1.028 (ref 1.005–1.030)

## 2015-05-30 LAB — URINE MICROSCOPIC-ADD ON

## 2015-05-30 LAB — RPR: RPR: NONREACTIVE

## 2015-05-30 LAB — HIV ANTIBODY (ROUTINE TESTING W REFLEX): HIV Screen 4th Generation wRfx: NONREACTIVE

## 2015-05-30 LAB — POC URINE PREG, ED: Preg Test, Ur: NEGATIVE

## 2015-05-30 MED ORDER — METRONIDAZOLE 500 MG PO TABS: 500.0000 mg | ORAL_TABLET | Freq: Two times a day (BID) | ORAL | Status: DC

## 2015-05-30 MED ORDER — FLUCONAZOLE 150 MG PO TABS: 150.0000 mg | ORAL_TABLET | ORAL | Status: DC

## 2015-05-30 MED ORDER — FLUCONAZOLE 200 MG PO TABS: 200.0000 mg | ORAL_TABLET | Freq: Every day | ORAL | Status: AC

## 2015-05-30 MED ORDER — TRAMADOL HCL 50 MG PO TABS: 50.0000 mg | ORAL_TABLET | Freq: Four times a day (QID) | ORAL | Status: DC | PRN

## 2015-05-30 MED ORDER — AMOXICILLIN 500 MG PO CAPS: 500.0000 mg | ORAL_CAPSULE | Freq: Three times a day (TID) | ORAL | Status: DC
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
## 2015-05-30 NOTE — Discharge Instructions (Signed)
Bacterial Vaginosis Bacterial vaginosis is a vaginal infection that occurs when the normal balance of bacteria in the vagina is disrupted. It results from an overgrowth of certain bacteria. This is the most common vaginal infection in women of childbearing age. Treatment is important to prevent complications, especially in pregnant women, as it can cause a premature delivery. CAUSES  Bacterial vaginosis is caused by an increase in harmful bacteria that are normally present in smaller amounts in the vagina. Several different kinds of bacteria can cause bacterial vaginosis. However, the reason that the condition develops is not fully understood. RISK FACTORS Certain activities or behaviors can put you at an increased risk of developing bacterial vaginosis, including:  Having a new sex partner or multiple sex partners.  Douching.  Using an intrauterine device (IUD) for contraception. Women do not get bacterial vaginosis from toilet seats, bedding, swimming pools, or contact with objects around them. SIGNS AND SYMPTOMS  Some women with bacterial vaginosis have no signs or symptoms. Common symptoms include:  Grey vaginal discharge.  A fishlike odor with discharge, especially after sexual intercourse.  Itching or burning of the vagina and vulva.  Burning or pain with urination. DIAGNOSIS  Your health care provider will take a medical history and examine the vagina for signs of bacterial vaginosis. A sample of vaginal fluid may be taken. Your health care provider will look at this sample under a microscope to check for bacteria and abnormal cells. A vaginal pH test may also be done.  TREATMENT  Bacterial vaginosis may be treated with antibiotic medicines. These may be given in the form of a pill or a vaginal cream. A second round of antibiotics may be prescribed if the condition comes back after treatment. Because bacterial vaginosis increases your risk for sexually transmitted diseases, getting  treated can help reduce your risk for chlamydia, gonorrhea, HIV, and herpes. HOME CARE INSTRUCTIONS   Only take over-the-counter or prescription medicines as directed by your health care provider.  If antibiotic medicine was prescribed, take it as directed. Make sure you finish it even if you start to feel better.  Tell all sexual partners that you have a vaginal infection. They should see their health care provider and be treated if they have problems, such as a mild rash or itching.  During treatment, it is important that you follow these instructions:  Avoid sexual activity or use condoms correctly.  Do not douche.  Avoid alcohol as directed by your health care provider.  Avoid breastfeeding as directed by your health care provider. SEEK MEDICAL CARE IF:   Your symptoms are not improving after 3 days of treatment.  You have increased discharge or pain.  You have a fever. MAKE SURE YOU:   Understand these instructions.  Will watch your condition.  Will get help right away if you are not doing well or get worse. FOR MORE INFORMATION  Centers for Disease Control and Prevention, Division of STD Prevention: www.cdc.gov/std American Sexual Health Association (ASHA): www.ashastd.org    This information is not intended to replace advice given to you by your health care provider. Make sure you discuss any questions you have with your health care provider.   Document Released: 12/19/2004 Document Revised: 01/09/2014 Document Reviewed: 07/31/2012 Elsevier Interactive Patient Education 2016 Elsevier Inc. Monilial Vaginitis Vaginitis in a soreness, swelling and redness (inflammation) of the vagina and vulva. Monilial vaginitis is not a sexually transmitted infection. CAUSES  Yeast vaginitis is caused by yeast (candida) that is normally found in   in your vagina. With a yeast infection, the candida has overgrown in number to a point that upsets the chemical balance. SYMPTOMS   White,  thick vaginal discharge.  Swelling, itching, redness and irritation of the vagina and possibly the lips of the vagina (vulva).  Burning or painful urination.  Painful intercourse. DIAGNOSIS  Things that may contribute to monilial vaginitis are:  Postmenopausal and virginal states.  Pregnancy.  Infections.  Being tired, sick or stressed, especially if you had monilial vaginitis in the past.  Diabetes. Good control will help lower the chance.  Birth control pills.  Tight fitting garments.  Using bubble bath, feminine sprays, douches or deodorant tampons.  Taking certain medications that kill germs (antibiotics).  Sporadic recurrence can occur if you become ill. TREATMENT  Your caregiver will give you medication.  There are several kinds of anti monilial vaginal creams and suppositories specific for monilial vaginitis. For recurrent yeast infections, use a suppository or cream in the vagina 2 times a week, or as directed.  Anti-monilial or steroid cream for the itching or irritation of the vulva may also be used. Get your caregiver's permission.  Painting the vagina with methylene blue solution may help if the monilial cream does not work.  Eating yogurt may help prevent monilial vaginitis. HOME CARE INSTRUCTIONS   Finish all medication as prescribed.  Do not have sex until treatment is completed or after your caregiver tells you it is okay.  Take warm sitz baths.  Do not douche.  Do not use tampons, especially scented ones.  Wear cotton underwear.  Avoid tight pants and panty hose.  Tell your sexual partner that you have a yeast infection. They should go to their caregiver if they have symptoms such as mild rash or itching.  Your sexual partner should be treated as well if your infection is difficult to eliminate.  Practice safer sex. Use condoms.  Some vaginal medications cause latex condoms to fail. Vaginal medications that harm condoms are:  Cleocin  cream.  Butoconazole (Femstat).  Terconazole (Terazol) vaginal suppository.  Miconazole (Monistat) (may be purchased over the counter). SEEK MEDICAL CARE IF:   You have a temperature by mouth above 102 F (38.9 C).  The infection is getting worse after 2 days of treatment.  The infection is not getting better after 3 days of treatment.  You develop blisters in or around your vagina.  You develop vaginal bleeding, and it is not your menstrual period.  You have pain when you urinate.  You develop intestinal problems.  You have pain with sexual intercourse.   This information is not intended to replace advice given to you by your health care provider. Make sure you discuss any questions you have with your health care provider.   Document Released: 09/28/2004 Document Revised: 03/13/2011 Document Reviewed: 06/22/2014 Elsevier Interactive Patient Education 2016 Elsevier Inc. Dental Pain Dental pain may be caused by many things, including:  Tooth decay (cavities or caries). Cavities expose the nerve of your tooth to air and hot or cold temperatures. This can cause pain or discomfort.  Abscess or infection. A dental abscess is a collection of infected pus from a bacterial infection in the inner part of the tooth (pulp). It usually occurs at the end of the tooth's root.  Injury.  An unknown reason (idiopathic). Your pain may be mild or severe. It may only occur when:  You are chewing.  You are exposed to hot or cold temperature.  You are eating or  drinking sugary foods or beverages, such as soda or candy. Your pain may also be constant. HOME CARE INSTRUCTIONS Watch your dental pain for any changes. The following actions may help to lessen any discomfort that you are feeling:  Take medicines only as directed by your dentist.  If you were prescribed an antibiotic medicine, finish all of it even if you start to feel better.  Keep all follow-up visits as directed by  your dentist. This is important.  Do not apply heat to the outside of your face.  Rinse your mouth or gargle with salt water if directed by your dentist. This helps with pain and swelling.  You can make salt water by adding  tsp of salt to 1 cup of warm water.  Apply ice to the painful area of your face:  Put ice in a plastic bag.  Place a towel between your skin and the bag.  Leave the ice on for 20 minutes, 2-3 times per day.  Avoid foods or drinks that cause you pain, such as:  Very hot or very cold foods or drinks.  Sweet or sugary foods or drinks. SEEK MEDICAL CARE IF:  Your pain is not controlled with medicines.  Your symptoms are worse.  You have new symptoms. SEEK IMMEDIATE MEDICAL CARE IF:  You are unable to open your mouth.  You are having trouble breathing or swallowing.  You have a fever.  Your face, neck, or jaw is swollen.   This information is not intended to replace advice given to you by your health care provider. Make sure you discuss any questions you have with your health care provider.   Document Released: 12/19/2004 Document Revised: 05/05/2014 Document Reviewed: 12/15/2013 Elsevier Interactive Patient Education 2016 Elsevier Inc.  Body Ringworm Ringworm (tinea corporis) is a fungal infection of the skin on the body. This infection is not caused by worms, but is actually caused by a fungus. Fungus normally lives on the top of your skin and can be useful. However, in the case of ringworms, the fungus grows out of control and causes a skin infection. It can involve any area of skin on the body and can spread easily from one person to another (contagious). Ringworm is a common problem for children, but it can affect adults as well. Ringworm is also often found in athletes, especially wrestlers who share equipment and mats.  CAUSES  Ringworm of the body is caused by a fungus called dermatophyte. It can spread by:  Touchingother people who are  infected.  Touchinginfected pets.  Touching or sharingobjects that have been in contact with the infected person or pet (hats, combs, towels, clothing, sports equipment). SYMPTOMS   Itchy, raised red spots and bumps on the skin.  Ring-shaped rash.  Redness near the border of the rash with a clear center.  Dry and scaly skin on or around the rash. Not every person develops a ring-shaped rash. Some develop only the red, scaly patches. DIAGNOSIS  Most often, ringworm can be diagnosed by performing a skin exam. Your caregiver may choose to take a skin scraping from the affected area. The sample will be examined under the microscope to see if the fungus is present.  TREATMENT  Body ringworm may be treated with a topical antifungal cream or ointment. Sometimes, an antifungal shampoo that can be used on your body is prescribed. You may be prescribed antifungal medicines to take by mouth if your ringworm is severe, keeps coming back, or lasts a long  time.  HOME CARE INSTRUCTIONS   Only take over-the-counter or prescription medicines as directed by your caregiver.  Wash the infected area and dry it completely before applying yourcream or ointment.  When using antifungal shampoo to treat the ringworm, leave the shampoo on the body for 3-5 minutes before rinsing.   Wear loose clothing to stop clothes from rubbing and irritating the rash.  Wash or change your bed sheets every night while you have the rash.  Have your pet treated by your veterinarian if it has the same infection. To prevent ringworm:   Practice good hygiene.  Wear sandals or shoes in public places and showers.  Do not share personal items with others.  Avoid touching red patches of skin on other people.  Avoid touching pets that have bald spots or wash your hands after doing so. SEEK MEDICAL CARE IF:   Your rash continues to spread after 7 days of treatment.  Your rash is not gone in 4 weeks.  The area around  your rash becomes red, warm, tender, and swollen.   This information is not intended to replace advice given to you by your health care provider. Make sure you discuss any questions you have with your health care provider.   Document Released: 12/17/1999 Document Revised: 09/13/2011 Document Reviewed: 07/03/2011 Elsevier Interactive Patient Education [employer]

## 2015-05-30 NOTE — ED Notes (Signed)
Pt given water as per RN's request.

## 2015-06-01 LAB — GC/CHLAMYDIA PROBE AMP (~~LOC~~) NOT AT ARMC
CHLAMYDIA, DNA PROBE: NEGATIVE
Neisseria Gonorrhea: NEGATIVE

## 2015-09-24 ENCOUNTER — Encounter (HOSPITAL_COMMUNITY): Payer: Self-pay | Admitting: Emergency Medicine

## 2015-09-24 ENCOUNTER — Emergency Department (HOSPITAL_COMMUNITY)
Admission: EM | Admit: 2015-09-24 | Discharge: 2015-09-24 | Disposition: A | Discharge status: Home / Self Care | Payer: Self-pay | Attending: Emergency Medicine | Admitting: Emergency Medicine

## 2015-09-24 ENCOUNTER — Emergency Department (HOSPITAL_COMMUNITY): Payer: Self-pay

## 2015-09-24 DIAGNOSIS — J45909 Unspecified asthma, uncomplicated: Secondary | ICD-10-CM | POA: Insufficient documentation

## 2015-09-24 DIAGNOSIS — F17210 Nicotine dependence, cigarettes, uncomplicated: Secondary | ICD-10-CM | POA: Insufficient documentation

## 2015-09-24 DIAGNOSIS — M79671 Pain in right foot: Secondary | ICD-10-CM | POA: Insufficient documentation

## 2015-09-24 DIAGNOSIS — J069 Acute upper respiratory infection, unspecified: Secondary | ICD-10-CM | POA: Insufficient documentation

## 2015-09-24 MED ORDER — ALBUTEROL SULFATE (2.5 MG/3ML) 0.083% IN NEBU
5.0000 mg | INHALATION_SOLUTION | Freq: Once | RESPIRATORY_TRACT | Status: AC
  Administered 2015-09-24: 5 mg via RESPIRATORY_TRACT
  Filled 2015-09-24: qty 6

## 2015-09-24 MED ORDER — ACETAMINOPHEN 500 MG PO TABS
1000.0000 mg | ORAL_TABLET | Freq: Once | ORAL | Status: AC
  Administered 2015-09-24: 1000 mg via ORAL
  Filled 2015-09-24: qty 2

## 2015-09-24 MED ORDER — ALBUTEROL SULFATE HFA 108 (90 BASE) MCG/ACT IN AERS
2.0000 | INHALATION_SPRAY | Freq: Once | RESPIRATORY_TRACT | Status: AC
  Administered 2015-09-24: 2 via RESPIRATORY_TRACT
  Filled 2015-09-24: qty 6.7

## 2015-09-24 MED ORDER — BENZONATATE 100 MG PO CAPS: 200.0000 mg | ORAL_CAPSULE | Freq: Two times a day (BID) | ORAL | 0 refills | Status: DC | PRN

## 2015-09-24 MED ORDER — OXYMETAZOLINE HCL 0.05 % NA SOLN: 1.0000 | Freq: Two times a day (BID) | NASAL | 0 refills | Status: DC

## 2015-09-24 NOTE — ED Triage Notes (Signed)
Pt sts body aches, ear pain and nasal congestion; pt sts some fever as well

## 2015-09-24 NOTE — ED Provider Notes (Signed)
[facility]Y DEPT Provider Note   CSN: [id number] Arrival date & time: 09/24/15  0840  By signing my name below, I, [name], attest that this documentation has been prepared under the direction and in the presence of [name], [place]. Electronically Signed: [name], Scribe. 09/24/2015. 9:13 AM.  History   Chief Complaint Chief Complaint  Patient presents with  . Generalized Body Aches  . Otalgia  . Nasal Congestion    The history is provided by the patient. No language interpreter was used.     HPI Comments: Tamara Bryant is a 25 y.o. female who presents to the Emergency Department complaining of constant flu-like symptoms for the last 4 days. Pt notes fever, chills, sore throat, ear pain, headache, congestion, productive cough with mucous, nausea, 1 episode of NBNB vomiting and 1 episode of nonbloody diarrhea, generalized myalgias, and some wheezing since onset. She reports she has asthma and has some wheezing at baseline, but this symptoms have worsened since onset. Pt does not have an inhaler. She reports she measured a fever of 102 shortly PTA today but denies taking any antipyretics. She has not used any treatments or medications since onset. She denies sick contacts with similar symptoms or recent travel. Pt further denies abdominal pain, CP, SOB, neck stiffness, rash, dysuria, vaginal bleeding, vaginal discharge, hematochezia, hematemesis, or any other associated symptoms.  Patient also reports she has had left foot/ankle pain for the past week. She reports tripping over something and since then she has had mild swelling and tenderness to her left foot. Denies taking any medications or using ice or relief. Denies numbness, tingling, weakness.  Past Medical History:  Diagnosis Date  . Asthma   . Obesity     There are no active problems to display for this patient.   Past Surgical History:  Procedure Laterality Date  . WISDOM TOOTH EXTRACTION      OB  History    Gravida Para Term Preterm AB Living   0             SAB TAB Ectopic Multiple Live Births                   Home Medications    Prior to Admission medications   Medication Sig Start Date End Date Taking? Authorizing Provider  albuterol (PROVENTIL HFA;VENTOLIN HFA) 108 (90 Base) MCG/ACT inhaler Inhale 1-2 puffs into the lungs every 6 (six) hours as needed for wheezing or shortness of breath.    Historical Provider, MD  amoxicillin (AMOXIL) 500 MG capsule Take 1 capsule (500 mg total) by mouth 3 (three) times daily. 05/30/15   [name], MD  metroNIDAZOLE (FLAGYL) 500 MG tablet Take 1 tablet (500 mg total) by mouth 2 (two) times daily. One po bid x 7 days 05/30/15   [name], MD  traMADol (ULTRAM) 50 MG tablet Take 1 tablet (50 mg total) by mouth every 6 (six) hours as needed. 05/30/15   [name], MD    Family History Family History  Problem Relation Age of Onset  . Hypertension Mother   . Sickle cell anemia Mother   . Diabetes Father   . Hypertension Father   . Diabetes Maternal Grandmother   . Diabetes Paternal Grandfather     Social History Social History  Substance Use Topics  . Smoking status: Current Every Day Smoker    Packs/day: 1.00    Types: Cigarettes  . Smokeless tobacco: Never Used  . Alcohol  use Yes     Comment: Occasional      Allergies   Review of patient's allergies indicates no known allergies.   Review of Systems Review of Systems  Constitutional: Positive for chills and fever.  HENT: Positive for congestion, ear pain (bilateral) and sore throat.   Respiratory: Positive for cough (productive) and wheezing.   Gastrointestinal: Positive for diarrhea, nausea and vomiting. Negative for blood in stool.       Negative for hematemesis.  Genitourinary: Negative for dysuria, vaginal bleeding and vaginal discharge.  Musculoskeletal: Positive for arthralgias (left ankle/foot) and myalgias (generalized).    Neurological: Positive for headaches.  All other systems reviewed and are negative.   Physical Exam Updated Vital Signs BP 130/82 (BP Location: Right Arm)   Pulse 97   Temp 99.2 F (37.3 C) (Oral)   Resp 18   SpO2 100%   Physical Exam  Constitutional: She is oriented to person, place, and time. She appears well-developed and well-nourished.  HENT:  Head: Normocephalic and atraumatic.  Right Ear: Tympanic membrane normal.  Left Ear: Tympanic membrane normal.  Nose: Right sinus exhibits maxillary sinus tenderness. Right sinus exhibits no frontal sinus tenderness. Left sinus exhibits maxillary sinus tenderness. Left sinus exhibits no frontal sinus tenderness.  Mouth/Throat: Uvula is midline, oropharynx is clear and moist and mucous membranes are normal. No oropharyngeal exudate, posterior oropharyngeal edema, posterior oropharyngeal erythema or tonsillar abscesses.  Eyes: Conjunctivae and EOM are normal. Right eye exhibits no discharge. Left eye exhibits no discharge. No scleral icterus.  Neck: Normal range of motion. Neck supple.  Cardiovascular: Normal rate, regular rhythm, normal heart sounds and intact distal pulses.   Pulmonary/Chest: Effort normal. No respiratory distress. She has wheezes. She has no rales. She exhibits no tenderness.  Mild end expiratory wheezes bilaterally.  Abdominal: Soft. Bowel sounds are normal. She exhibits no distension and no mass. There is no tenderness. There is no rebound and no guarding. No hernia.  Musculoskeletal: She exhibits no edema.       Left ankle: She exhibits normal range of motion, no swelling, no ecchymosis, no deformity, no laceration and normal pulse. No tenderness. Achilles tendon normal.       Left foot: There is tenderness. There is normal range of motion, no swelling, normal capillary refill, no crepitus, no deformity and no laceration.       Feet:  Mild TTP over left lateral anterior foot. FROM of left toes, fore foot and ankle. No  swelling, erythema, warmth or ecchymoses noted. 2+ DP pulses. Sensation grossly intact. Cap refill <2.   Lymphadenopathy:    She has cervical adenopathy (bilateral submandibular lymphadenopathy).  Neurological: She is alert and oriented to person, place, and time.  Skin: Skin is warm and dry. No rash noted.  Nursing note and vitals reviewed.   ED Treatments / Results  Labs (all labs ordered are listed, but only abnormal results are displayed) Labs Reviewed - No data to display  EKG  EKG Interpretation None       Radiology No results found.  Procedures Procedures (including critical care time)  DIAGNOSTIC STUDIES: Oxygen Saturation is 100% on RA, normal by my interpretation.    COORDINATION OF CARE: 9:13 AM Discussed treatment plan with pt at bedside and pt agreed to plan.  Medications Ordered in ED Medications - No data to display   Initial Impression / Assessment and Plan / ED Course  I have reviewed the triage vital signs and the nursing notes.  Pertinent  labs & imaging results that were available during my care of the patient were reviewed by me and considered in my medical decision making (see chart for details).  Clinical Course    Patient presents with upper respiratory symptoms with one episode of NBNB vomiting and nonbloody diarrhea yesterday. Endorses associated fever have her denies taking any medications for her symptoms. History of asthma. VSS. Exam revealed mild end expiratory wheezes bilaterally, no signs of respiratory distress. Tenderness over bilateral maxillary sinuses. MMM. No abdominal tenderness. Remaining exam unremarkable. Patient given albuterol neb in the ED with Tylenol. CXR x-ray negative. On reevaluation patient is resting comfortably in grandmother reports her wheezing has improved. Reexamination revealed complete resolution of wheezing, lungs clear to auscultation bilaterally. Suspect patient's symptoms are likely due to viral URI. Plan to  discharge patient home with albuterol inhaler, decongestion and antitussive. Advised patient to take antipyretics at home as an for fever/pain relief. Patient given information to follow up with PCP.  Patient also presented with left foot/ankle pain that started over the past week after tripping over an object. Reports mild swelling and pain with ambulation. Exam revealed mild tenderness over left lateral anterior forefoot without swelling, ecchymosis or injury visible. Full range of motion of left lower extremity with 5 out of 5 strength. Patient able to stand and ambulate. Suspect contusion. Plan to discharge patient home with symptomatic treatment including NSAIDs and RICE tx.   I personally performed the services described in this documentation, which was scribed in my presence. The recorded information has been reviewed and is accurate.   Final Clinical Impressions(s) / ED Diagnoses   Final diagnoses:  None    New Prescriptions New Prescriptions   No medications on file     [name], PA-C 09/24/15 1037    Zadie Rhine, MD 09/27/15 [phone]

## 2015-09-24 NOTE — Discharge Instructions (Signed)
Take your medications as prescribed. I also recommend continuing to take Tylenol and ibuprofen as prescribed over-the-counter, alternating between doses every 3-4 hours as hand for fever and pain relief. Use your albuterol inhaler as prescribed as needed for shortness of breath/wheezing. I also recommend resting, elevating and applying ice to her right ankle for 15 minutes 3-4 times daily to help with pain and swelling. Please follow up with a primary care provider from the Resource Guide provided below in one week if your symptoms have not improved. Please return to the Emergency Department if symptoms worsen or new onset of headache, neck stiffness, difficulty breathing, chest pain, coughing up blood, vomiting, abdominal pain, unable to keep fluids down, numbness, tingling, weakness, rash.

## 2015-09-25 ENCOUNTER — Encounter (HOSPITAL_COMMUNITY): Payer: Self-pay

## 2015-09-25 ENCOUNTER — Emergency Department (HOSPITAL_COMMUNITY): Payer: Self-pay

## 2015-09-25 ENCOUNTER — Emergency Department (HOSPITAL_COMMUNITY)
Admission: EM | Admit: 2015-09-25 | Discharge: 2015-09-25 | Disposition: A | Discharge status: Home / Self Care | Payer: Self-pay | Attending: Emergency Medicine | Admitting: Emergency Medicine

## 2015-09-25 DIAGNOSIS — R509 Fever, unspecified: Secondary | ICD-10-CM | POA: Insufficient documentation

## 2015-09-25 DIAGNOSIS — F17210 Nicotine dependence, cigarettes, uncomplicated: Secondary | ICD-10-CM | POA: Insufficient documentation

## 2015-09-25 DIAGNOSIS — J069 Acute upper respiratory infection, unspecified: Secondary | ICD-10-CM | POA: Insufficient documentation

## 2015-09-25 DIAGNOSIS — A599 Trichomoniasis, unspecified: Secondary | ICD-10-CM | POA: Insufficient documentation

## 2015-09-25 DIAGNOSIS — J45909 Unspecified asthma, uncomplicated: Secondary | ICD-10-CM | POA: Insufficient documentation

## 2015-09-25 LAB — URINALYSIS, ROUTINE W REFLEX MICROSCOPIC
Bilirubin Urine: NEGATIVE
GLUCOSE, UA: NEGATIVE mg/dL
KETONES UR: 15 mg/dL — AB
Nitrite: NEGATIVE
PH: 6 (ref 5.0–8.0)
PROTEIN: NEGATIVE mg/dL
Specific Gravity, Urine: 1.009 (ref 1.005–1.030)

## 2015-09-25 LAB — CBC WITH DIFFERENTIAL/PLATELET
BASOS ABS: 0.1 10*3/uL (ref 0.0–0.1)
Basophils Relative: 0 %
EOS PCT: 0 %
Eosinophils Absolute: 0 10*3/uL (ref 0.0–0.7)
HEMATOCRIT: 41.8 % (ref 36.0–46.0)
HEMOGLOBIN: 13.8 g/dL (ref 12.0–15.0)
LYMPHS ABS: 2.6 10*3/uL (ref 0.7–4.0)
LYMPHS PCT: 19 %
MCH: 27 pg (ref 26.0–34.0)
MCHC: 33 g/dL (ref 30.0–36.0)
MCV: 81.6 fL (ref 78.0–100.0)
Monocytes Absolute: 1.1 10*3/uL — ABNORMAL HIGH (ref 0.1–1.0)
Monocytes Relative: 9 %
NEUTROS ABS: 9.6 10*3/uL — AB (ref 1.7–7.7)
Neutrophils Relative %: 72 %
Platelets: 273 10*3/uL (ref 150–400)
RBC: 5.12 MIL/uL — AB (ref 3.87–5.11)
RDW: 16.1 % — ABNORMAL HIGH (ref 11.5–15.5)
WBC: 13.4 10*3/uL — AB (ref 4.0–10.5)

## 2015-09-25 LAB — COMPREHENSIVE METABOLIC PANEL
ALT: 111 U/L — ABNORMAL HIGH (ref 14–54)
ANION GAP: 10 (ref 5–15)
AST: 162 U/L — AB (ref 15–41)
Albumin: 3.3 g/dL — ABNORMAL LOW (ref 3.5–5.0)
Alkaline Phosphatase: 109 U/L (ref 38–126)
BILIRUBIN TOTAL: 0.7 mg/dL (ref 0.3–1.2)
BUN: 13 mg/dL (ref 6–20)
CHLORIDE: 105 mmol/L (ref 101–111)
CO2: 21 mmol/L — ABNORMAL LOW (ref 22–32)
Calcium: 9.3 mg/dL (ref 8.9–10.3)
Creatinine, Ser: 0.93 mg/dL (ref 0.44–1.00)
GFR calc Af Amer: >60 mL/min (ref >60)
Glucose, Bld: 97 mg/dL (ref 65–99)
POTASSIUM: 3.9 mmol/L (ref 3.5–5.1)
Sodium: 136 mmol/L (ref 135–145)
TOTAL PROTEIN: 7.2 g/dL (ref 6.5–8.1)

## 2015-09-25 LAB — INFLUENZA PANEL BY PCR (TYPE A & B)
H1N1FLUPCR: NOT DETECTED
INFLAPCR: NEGATIVE
Influenza B By PCR: NEGATIVE

## 2015-09-25 LAB — RAPID STREP SCREEN (MED CTR MEBANE ONLY): STREPTOCOCCUS, GROUP A SCREEN (DIRECT): NEGATIVE

## 2015-09-25 LAB — I-STAT CG4 LACTIC ACID, ED: LACTIC ACID, VENOUS: 0.93 mmol/L (ref 0.5–1.9)

## 2015-09-25 LAB — LIPASE, BLOOD: Lipase: 20 U/L (ref 11–51)

## 2015-09-25 LAB — MONONUCLEOSIS SCREEN: MONO SCREEN: NEGATIVE

## 2015-09-25 LAB — POC URINE PREG, ED: PREG TEST UR: NEGATIVE

## 2015-09-25 LAB — URINE MICROSCOPIC-ADD ON

## 2015-09-25 MED ORDER — SODIUM CHLORIDE 0.9 % IV BOLUS (SEPSIS)
500.0000 mL | Freq: Once | INTRAVENOUS | Status: AC
  Administered 2015-09-25: 500 mL via INTRAVENOUS

## 2015-09-25 MED ORDER — SODIUM CHLORIDE 0.9 % IV BOLUS (SEPSIS)
1000.0000 mL | Freq: Once | INTRAVENOUS | Status: AC
  Administered 2015-09-25: 1000 mL via INTRAVENOUS

## 2015-09-25 MED ORDER — SODIUM CHLORIDE 0.9 % IV BOLUS (SEPSIS)
1000.0000 mL | Freq: Once | INTRAVENOUS | Status: AC
Start: 2015-09-25 — End: 2015-09-25
  Administered 2015-09-25: 1000 mL via INTRAVENOUS

## 2015-09-25 MED ORDER — PROMETHAZINE HCL 25 MG PO TABS: 25.0000 mg | ORAL_TABLET | Freq: Four times a day (QID) | ORAL | 0 refills | Status: DC | PRN

## 2015-09-25 MED ORDER — IBUPROFEN 200 MG PO TABS
400.0000 mg | ORAL_TABLET | Freq: Once | ORAL | Status: AC
Start: 2015-09-25 — End: 2015-09-25
  Administered 2015-09-25: 400 mg via ORAL
  Filled 2015-09-25: qty 2

## 2015-09-25 MED ORDER — PROCHLORPERAZINE EDISYLATE 5 MG/ML IJ SOLN
10.0000 mg | Freq: Once | INTRAMUSCULAR | Status: AC
  Administered 2015-09-25: 10 mg via INTRAVENOUS
  Filled 2015-09-25: qty 2

## 2015-09-25 MED ORDER — METRONIDAZOLE 500 MG PO TABS
2000.0000 mg | ORAL_TABLET | Freq: Once | ORAL | Status: AC
  Administered 2015-09-25: 2000 mg via ORAL
  Filled 2015-09-25: qty 4

## 2015-09-25 NOTE — Discharge Instructions (Signed)
You were not tested for all STDs today. Your gonorrhea and chlamydia tests are pending- if they are positive, you will receive a phone call. Refrain from sex until you have the results from a full STD screen. Please go to [facility] (Address: [address], [place], [place] [address] Phone: [phone]) or see the [facility] (Address: [address] Phone: [phone]) for full STD screening. Return to the emergency room for worsening of symptoms, fever, and vomiting.  Return to the emergency room for any worsening or concerning symptoms including fast breathing, heart racing, confusion, vomiting.  Rest, cover your mouth when you cough and wash your hands frequently.   Push fluids: water or Gatorade, do not drink any soda, juice or caffeinated beverages.  For fever and pain control you can take Motrin (ibuprofen) as follows: 400 mg (this is normally 2 over the counter pills) every 4 hours with food.  Do not return to work until a day after your fever breaks.

## 2015-09-25 NOTE — ED Provider Notes (Signed)
[facility] Provider Note   CSN: [id number] Arrival date & time: 09/25/15  1146     History   Chief Complaint No chief complaint on file.   HPI  Blood pressure 118/81, pulse 117, temperature 100.6 F (38.1 C), temperature source Oral, resp. rate 22, SpO2 98 %.  Tamara Bryant is a 25 y.o. female with past medical history significant for asthma, obesity complaining of 5 days of worsening fever, chills, rhinorrhea, sore throat, otalgia, productive cough she started having one episode of nonbloody, nonbilious, no coffee-ground emesis this morning and started having abdominal pain afterwards. She also reports a associated symptoms of global headache which is severe with no cervicalgia, rash. She does have sick contact in that her siblings are sick but they're illness started after hers. No history of tick bites or camping, numbness, dysarthria, ataxia.  HPI  Past Medical History:  Diagnosis Date  . Asthma   . Obesity     There are no active problems to display for this patient.   Past Surgical History:  Procedure Laterality Date  . WISDOM TOOTH EXTRACTION      OB History    Gravida Para Term Preterm AB Living   0             SAB TAB Ectopic Multiple Live Births                   Home Medications    Prior to Admission medications   Medication Sig Start Date End Date Taking? Authorizing Provider  albuterol (PROVENTIL HFA;VENTOLIN HFA) 108 (90 Base) MCG/ACT inhaler Inhale 1-2 puffs into the lungs every 6 (six) hours as needed for wheezing or shortness of breath.    Historical Provider, MD  amoxicillin (AMOXIL) 500 MG capsule Take 1 capsule (500 mg total) by mouth 3 (three) times daily. 05/30/15   [name], MD  benzonatate (TESSALON) 100 MG capsule Take 2 capsules (200 mg total) by mouth 2 (two) times daily as needed for cough. 09/24/15   [name], PA-C  metroNIDAZOLE (FLAGYL) 500 MG tablet Take 1 tablet (500 mg total) by mouth 2 (two)  times daily. One po bid x 7 days 05/30/15   [name], MD  oxymetazoline (AFRIN NASAL SPRAY) 0.05 % nasal spray Place 1 spray into both nostrils 2 (two) times daily. Apply 1 spray to both nostrils twice daily for the next 3 days. Do not use for longer than 3 days to prevent rebound rhinorrhea. 09/24/15   [name], PA-C  promethazine (PHENERGAN) 25 MG tablet Take 1 tablet (25 mg total) by mouth every 6 (six) hours as needed for nausea or vomiting. 09/25/15   Joni Reining Zaria Taha, PA-C  traMADol (ULTRAM) 50 MG tablet Take 1 tablet (50 mg total) by mouth every 6 (six) hours as needed. 05/30/15   [name], MD    Family History Family History  Problem Relation Age of Onset  . Hypertension Mother   . Sickle cell anemia Mother   . Diabetes Father   . Hypertension Father   . Diabetes Maternal Grandmother   . Diabetes Paternal Grandfather     Social History Social History  Substance Use Topics  . Smoking status: Current Every Day Smoker    Packs/day: 1.00    Types: Cigarettes  . Smokeless tobacco: Never Used  . Alcohol use Yes     Comment: Occasional      Allergies   Review of patient's allergies indicates no known  allergies.   Review of Systems Review of Systems  10 systems reviewed and found to be negative, except as noted in the HPI.   Physical Exam Updated Vital Signs BP (!) 121/106   Pulse 91   Temp 100.6 F (38.1 C) (Oral)   Resp 18   Wt 102.1 kg   LMP 07/28/2015 (Within Weeks)   SpO2 100%   BMI 38.62 kg/m   Physical Exam  Constitutional: She is oriented to person, place, and time. She appears well-developed and well-nourished. No distress.  HENT:  Head: Normocephalic and atraumatic.  Mouth/Throat: Oropharyngeal exudate present.  Diffuse rhinorrhea  Bilateral tympanic membranes with normal architecture, slightly dulled reflex.  No tenderness palpation over sinuses.  Tonsillar hypertrophy 2+ bilaterally with exudate and tender  anterior and posterior cervical lymphadenopathy.    Eyes: Conjunctivae and EOM are normal. Pupils are equal, round, and reactive to light.  Neck: Normal range of motion.  Cardiovascular: Normal rate, regular rhythm and intact distal pulses.   Pulmonary/Chest: Effort normal and breath sounds normal. No respiratory distress. She has no wheezes. She has no rales. She exhibits no tenderness.  Abdominal: Soft. Bowel sounds are normal. She exhibits no distension and no mass. There is no tenderness. There is no rebound and no guarding. No hernia.  Musculoskeletal: Normal range of motion.  Neurological: She is alert and oriented to person, place, and time.  Skin: She is not diaphoretic.  Psychiatric: She has a normal mood and affect.  Nursing note and vitals reviewed.    ED Treatments / Results  Labs (all labs ordered are listed, but only abnormal results are displayed) Labs Reviewed  COMPREHENSIVE METABOLIC PANEL - Abnormal; Notable for the following:       Result Value   CO2 21 (*)    Albumin 3.3 (*)    AST 162 (*)    ALT 111 (*)    All other components within normal limits  CBC WITH DIFFERENTIAL/PLATELET - Abnormal; Notable for the following:    WBC 13.4 (*)    RBC 5.12 (*)    RDW 16.1 (*)    Neutro Abs 9.6 (*)    Monocytes Absolute 1.1 (*)    All other components within normal limits  URINALYSIS, ROUTINE W REFLEX MICROSCOPIC (NOT AT [facility]) - Abnormal; Notable for the following:    Hgb urine dipstick MODERATE (*)    Ketones, ur 15 (*)    Leukocytes, UA SMALL (*)    All other components within normal limits  URINE MICROSCOPIC-ADD ON - Abnormal; Notable for the following:    Squamous Epithelial / LPF 0-5 (*)    Bacteria, UA FEW (*)    All other components within normal limits  RAPID STREP SCREEN (NOT AT [facility])  CULTURE, BLOOD (ROUTINE X 2)  CULTURE, BLOOD (ROUTINE X 2)  URINE CULTURE  CULTURE, GROUP A STREP ([facility])  LIPASE, BLOOD  MONONUCLEOSIS SCREEN  INFLUENZA PANEL BY PCR  (TYPE A & B, H1N1)  RPR  HIV ANTIBODY (ROUTINE TESTING)  I-STAT CG4 LACTIC ACID, ED  POC URINE PREG, ED  I-STAT CG4 LACTIC ACID, ED  GC/CHLAMYDIA PROBE AMP ([place]) NOT AT [facility]    EKG  EKG Interpretation None       Radiology Dg Chest 2 View  Result Date: 09/25/2015 CLINICAL DATA:  Pt c/o body aches, generalized chest pain and weakness, productive cough and congestion for 5 days. Pt states she began experiencing n/v and migraines today. Pt states she has had a fever  for 2 days. Hx asthma, current smoker (less than 1/2 ppd). EXAM: CHEST - 2 VIEW COMPARISON:  09/24/2015 FINDINGS: Lungs are clear. Heart size and mediastinal contours are within normal limits. No effusion. Visualized bones unremarkable. IMPRESSION: No acute cardiopulmonary disease. Electronically Signed   By: Corlis Leak M.D.   On: 09/25/2015 14:15   Dg Chest 2 View  Result Date: 09/24/2015 CLINICAL DATA:  ear pain, nasal congestion EXAM: CHEST  2 VIEW COMPARISON:  08/26/2014 FINDINGS: The heart size and mediastinal contours are within normal limits. Both lungs are clear. The visualized skeletal structures are unremarkable. IMPRESSION: No active cardiopulmonary disease. Electronically Signed   By: Natasha Mead M.D.   On: 09/24/2015 10:00    Procedures Procedures (including critical care time)  Medications Ordered in ED Medications  sodium chloride 0.9 % bolus 1,000 mL (0 mLs Intravenous Stopped 09/25/15 1324)    And  sodium chloride 0.9 % bolus 1,000 mL (0 mLs Intravenous Stopped 09/25/15 1338)    And  sodium chloride 0.9 % bolus 1,000 mL (0 mLs Intravenous Stopped 09/25/15 1413)    And  sodium chloride 0.9 % bolus 500 mL (0 mLs Intravenous Stopped 09/25/15 1358)  prochlorperazine (COMPAZINE) injection 10 mg (10 mg Intravenous Given 09/25/15 1257)  ibuprofen (ADVIL,MOTRIN) tablet 400 mg (400 mg Oral Given 09/25/15 1534)  metroNIDAZOLE (FLAGYL) tablet 2,000 mg (2,000 mg Oral Given 09/25/15 1534)     Initial Impression  / Assessment and Plan / ED Course  I have reviewed the triage vital signs and the nursing notes.  Pertinent labs & imaging results that were available during my care of the patient were reviewed by me and considered in my medical decision making (see chart for details).  Clinical Course     Vitals:   09/25/15 1330 09/25/15 1345 09/25/15 1430 09/25/15 1500  BP: 130/87 120/87 128/89 (!) 121/106  Pulse: 111 103 105 91  Resp: 21 17 17 18   Temp:      TempSrc:      SpO2: 97% 100% 100% 100%  Weight:        Medications  sodium chloride 0.9 % bolus 1,000 mL (0 mLs Intravenous Stopped 09/25/15 1324)    And  sodium chloride 0.9 % bolus 1,000 mL (0 mLs Intravenous Stopped 09/25/15 1338)    And  sodium chloride 0.9 % bolus 1,000 mL (0 mLs Intravenous Stopped 09/25/15 1413)    And  sodium chloride 0.9 % bolus 500 mL (0 mLs Intravenous Stopped 09/25/15 1358)  prochlorperazine (COMPAZINE) injection 10 mg (10 mg Intravenous Given 09/25/15 1257)  ibuprofen (ADVIL,MOTRIN) tablet 400 mg (400 mg Oral Given 09/25/15 1534)  metroNIDAZOLE (FLAGYL) tablet 2,000 mg (2,000 mg Oral Given 09/25/15 1534)    Tamara Bryant is 25 y.o. female presenting withUpper respiratory symptoms of cough, rhinorrhea, fever, chills, sore throat, otalgia she also had some emesis starting this morning, patient is overall nontoxic appearing however she does have a fever of 100.6 and is tachycardic. She is also reporting headache however she has a nonfocal neuro exam and no meningeal signs. Sepsis protocol initiated, will hold on antibiotics until we get more results back in.   Blood work reassuring with normal lactic acid, mildly elevated white count. Incidentally noted on urinalysis we see trichomonas. Patient denies any lower abdominal pain, abnormal vaginal discharge.   I was informed by the nurse that patient states that her primary care physician is treating her for trichomoniasis, she has not yet picked up the prescription. She  declines pelvic exam in the ED, I have invited her to return to the emergency department at any time. Patient will be treated for trichomoniasis while she is in the emergency department. Consents to blood draw for HIV/RPR.  Evaluation does not show pathology that would require ongoing emergent intervention or inpatient treatment. Pt is hemodynamically stable and mentating appropriately. Discussed findings and plan with patient/guardian, who agrees with care plan. All questions answered. Return precautions discussed and outpatient follow up given.      Final Clinical Impressions(s) / ED Diagnoses   Final diagnoses:  Viral URI  Trichomoniasis    New Prescriptions Discharge Medication List as of 09/25/2015  3:24 PM    START taking these medications   Details  promethazine (PHENERGAN) 25 MG tablet Take 1 tablet (25 mg total) by mouth every 6 (six) hours as needed for nausea or vomiting., Starting Sat 09/25/2015, Print         [place], PA-C 09/25/15 1601    Glynn Octave, MD 09/25/15 1743

## 2015-09-25 NOTE — ED Triage Notes (Signed)
Patient here with ongoing body aches, fever, vomiting x 1 week. Seen yesterday for same and states she feels worse, NAD

## 2015-09-25 NOTE — ED Notes (Signed)
Patient ambulated to bathroom without assistance. 

## 2015-09-25 NOTE — ED Notes (Signed)
Took tylenol this am but unsure if she vomited it up

## 2015-09-25 NOTE — ED Notes (Signed)
Pt states she already knew about the trich and does not want the pelvic, er md notified

## 2015-09-26 LAB — RPR: RPR: NONREACTIVE

## 2015-09-26 LAB — URINE CULTURE

## 2015-09-26 LAB — HIV ANTIBODY (ROUTINE TESTING W REFLEX): HIV Screen 4th Generation wRfx: NONREACTIVE

## 2015-09-27 LAB — CULTURE, GROUP A STREP (THRC)

## 2015-09-27 LAB — I-STAT CG4 LACTIC ACID, ED: LACTIC ACID, VENOUS: 0.66 mmol/L (ref 0.5–1.9)

## 2015-09-30 LAB — CULTURE, BLOOD (ROUTINE X 2)
CULTURE: NO GROWTH
CULTURE: NO GROWTH

## 2015-10-08 IMAGING — CR DG CHEST 2V
2 series · 2 of 2 positions shown · non-contrast
Comparison: Chest radiograph April 22, 2013

CLINICAL DATA: Shortness of breath for 25 minutes, nonproductive
cough for a few days. History of asthma.

EXAM:
CHEST  2 VIEW

[w chest pa]
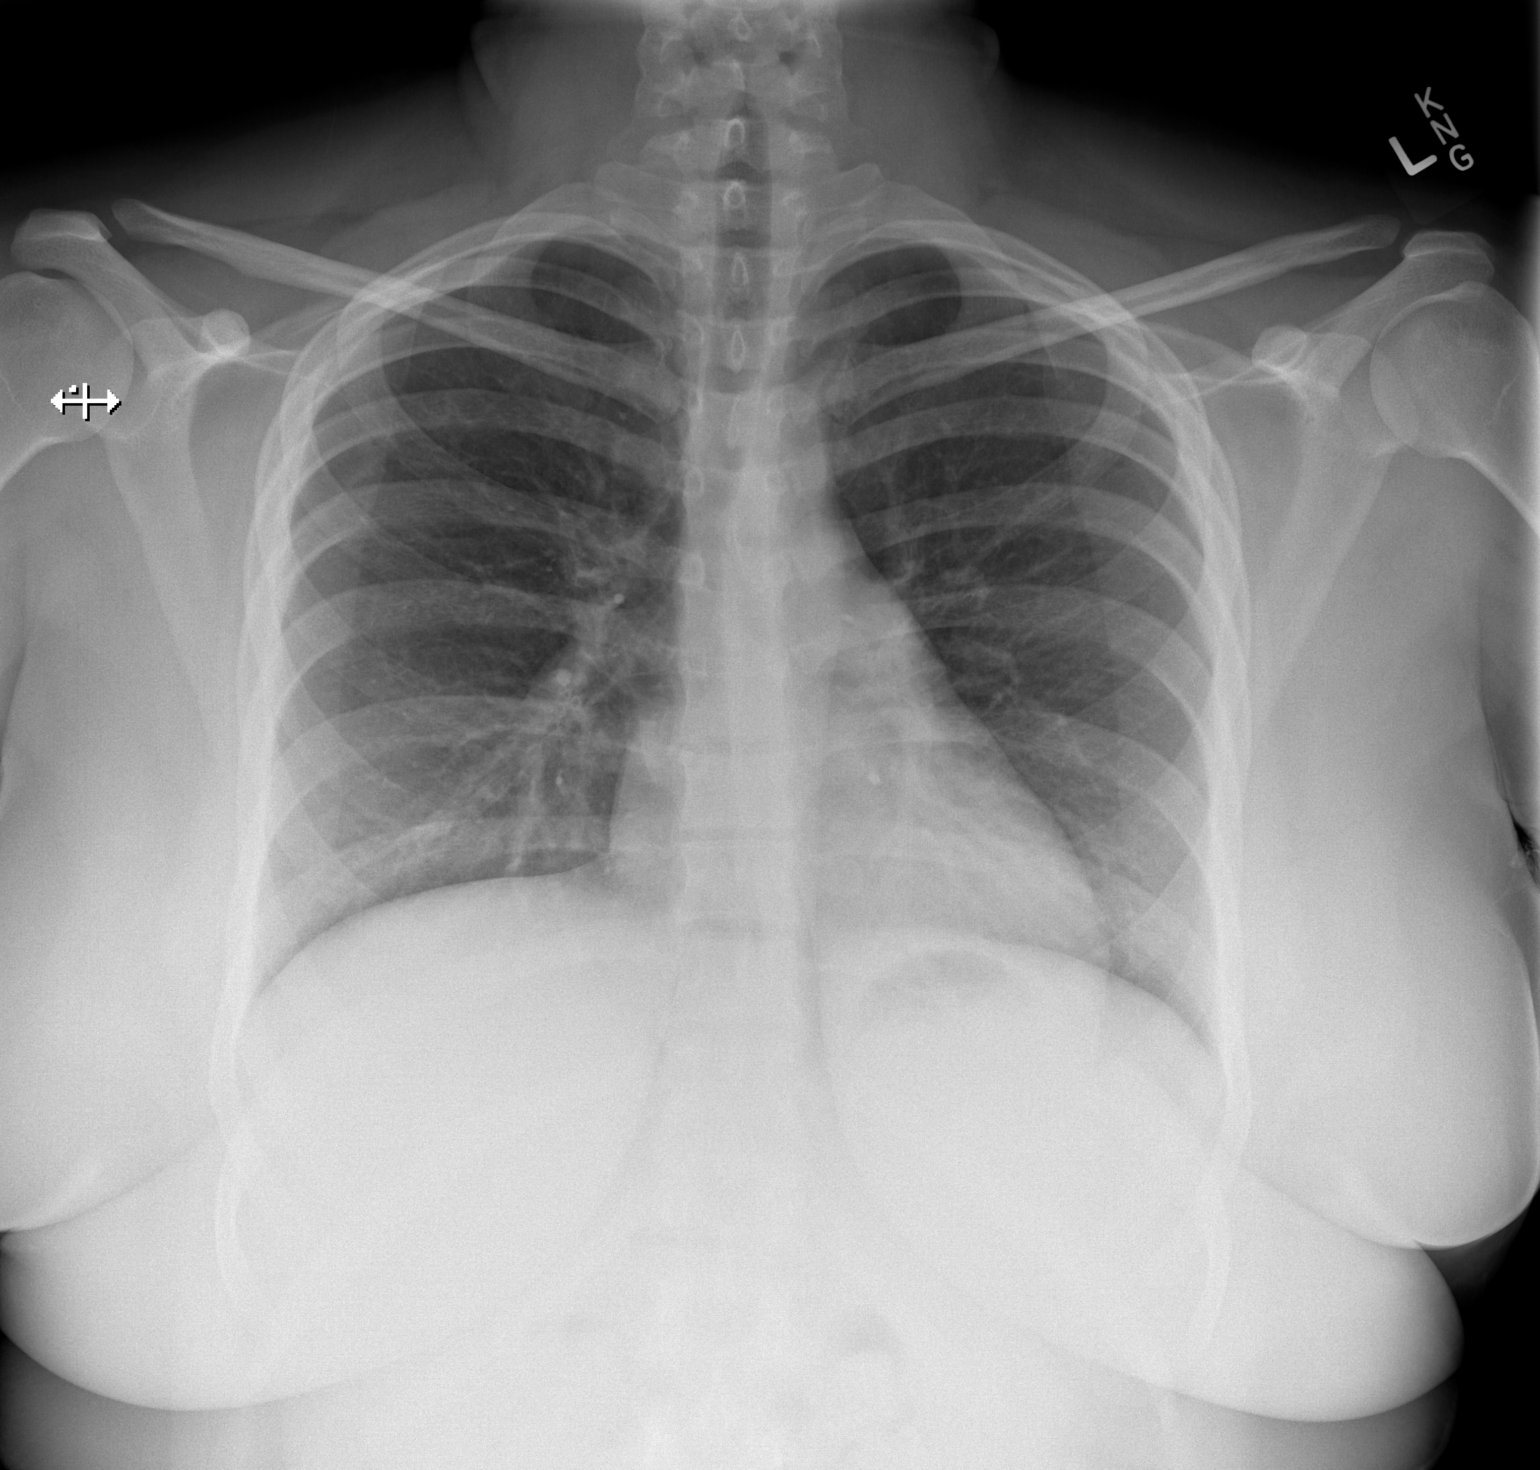

[w chest lat]
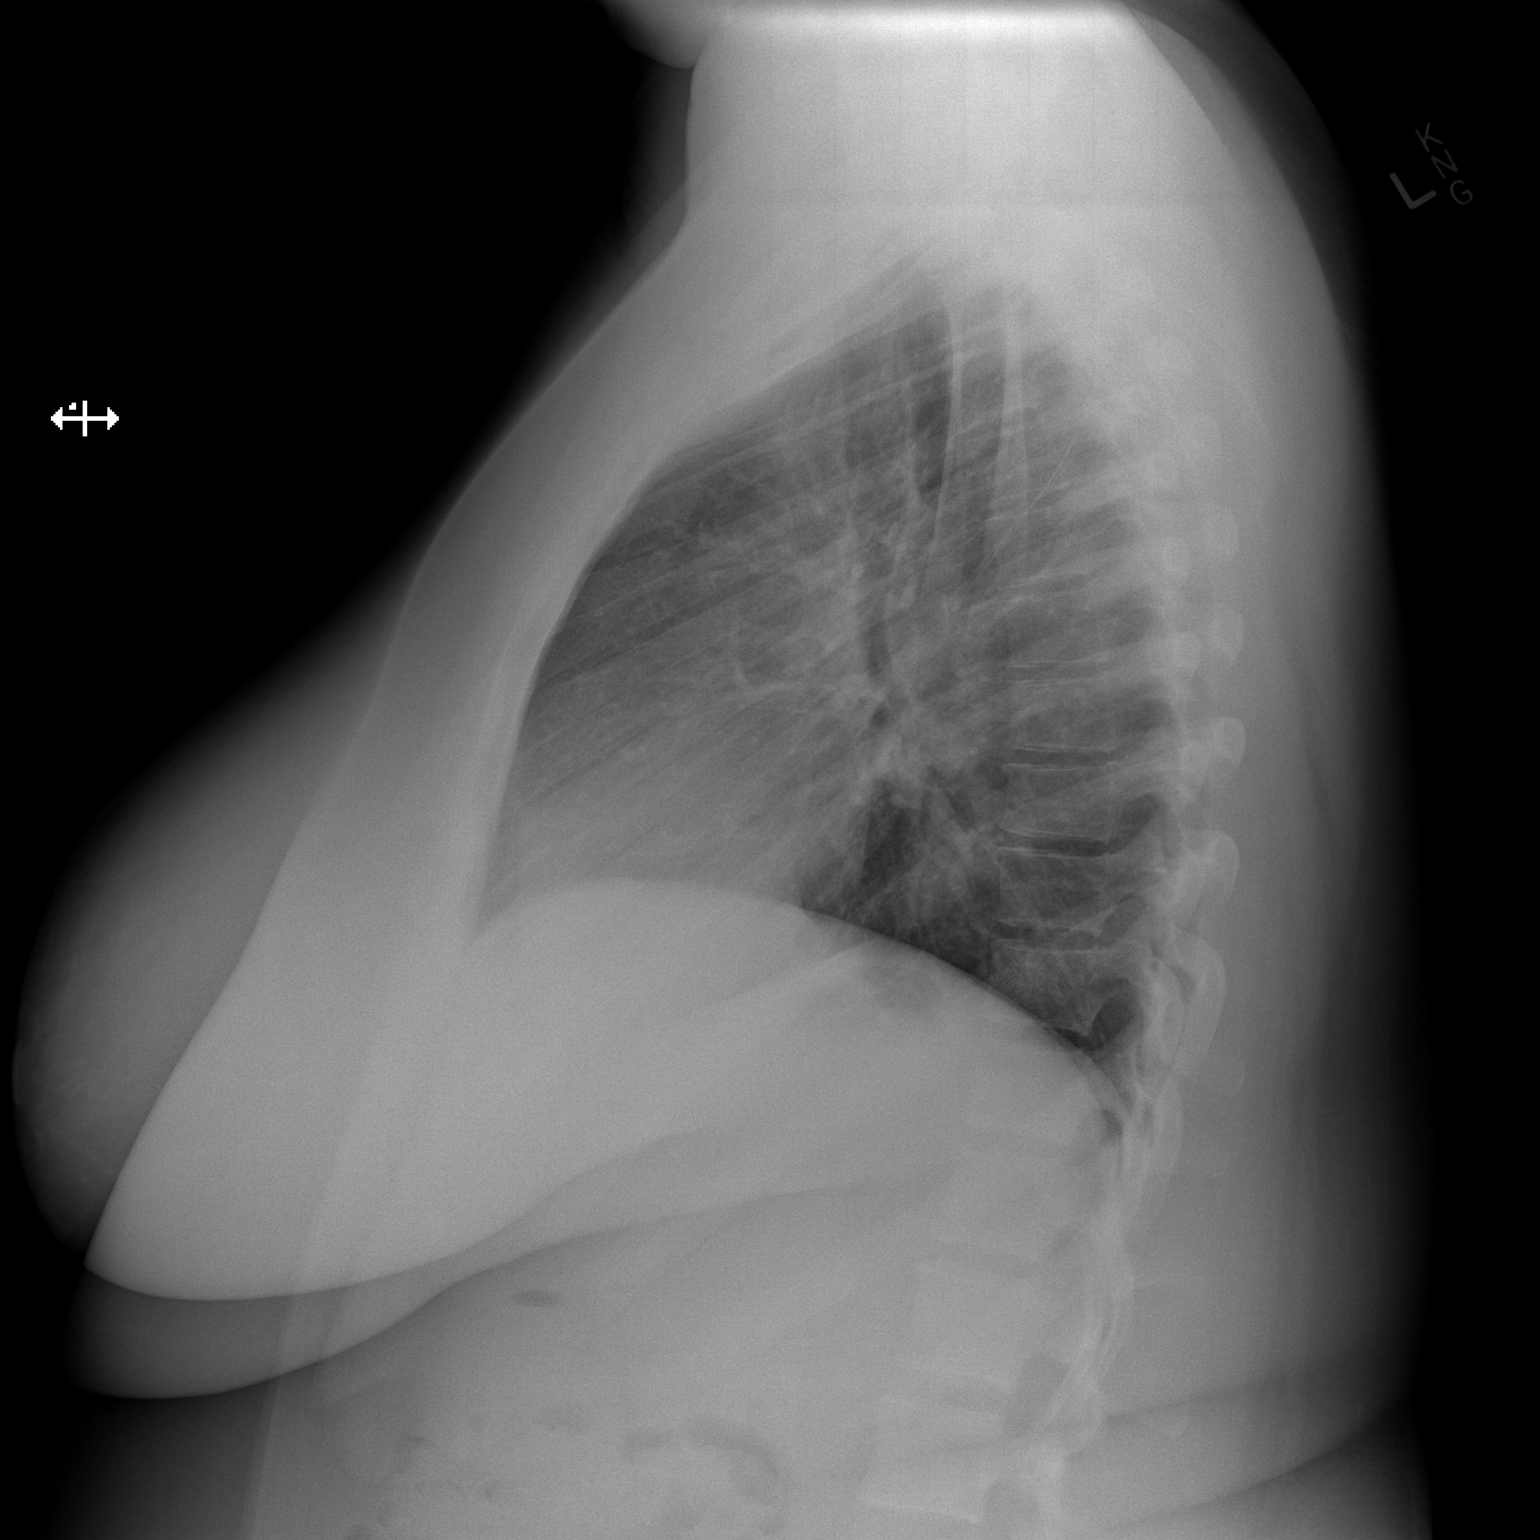

[2 of 2 positions shown; findings below may reference images not displayed]

FINDINGS: Cardiomediastinal silhouette is normal. The lungs are clear without
pleural effusions or focal consolidations. Trachea projects midline
and there is no pneumothorax. Soft tissue planes and included
osseous structures are non-suspicious.
IMPRESSION: Normal chest.

## 2015-10-12 IMAGING — CR DG CHEST 2V
2 series · 2 of 2 positions shown · non-contrast
Comparison: [DATE]

CLINICAL DATA: Right shoulder pain and numbness.  Chest pain.

EXAM:
CHEST  2 VIEW

[w chest lat]
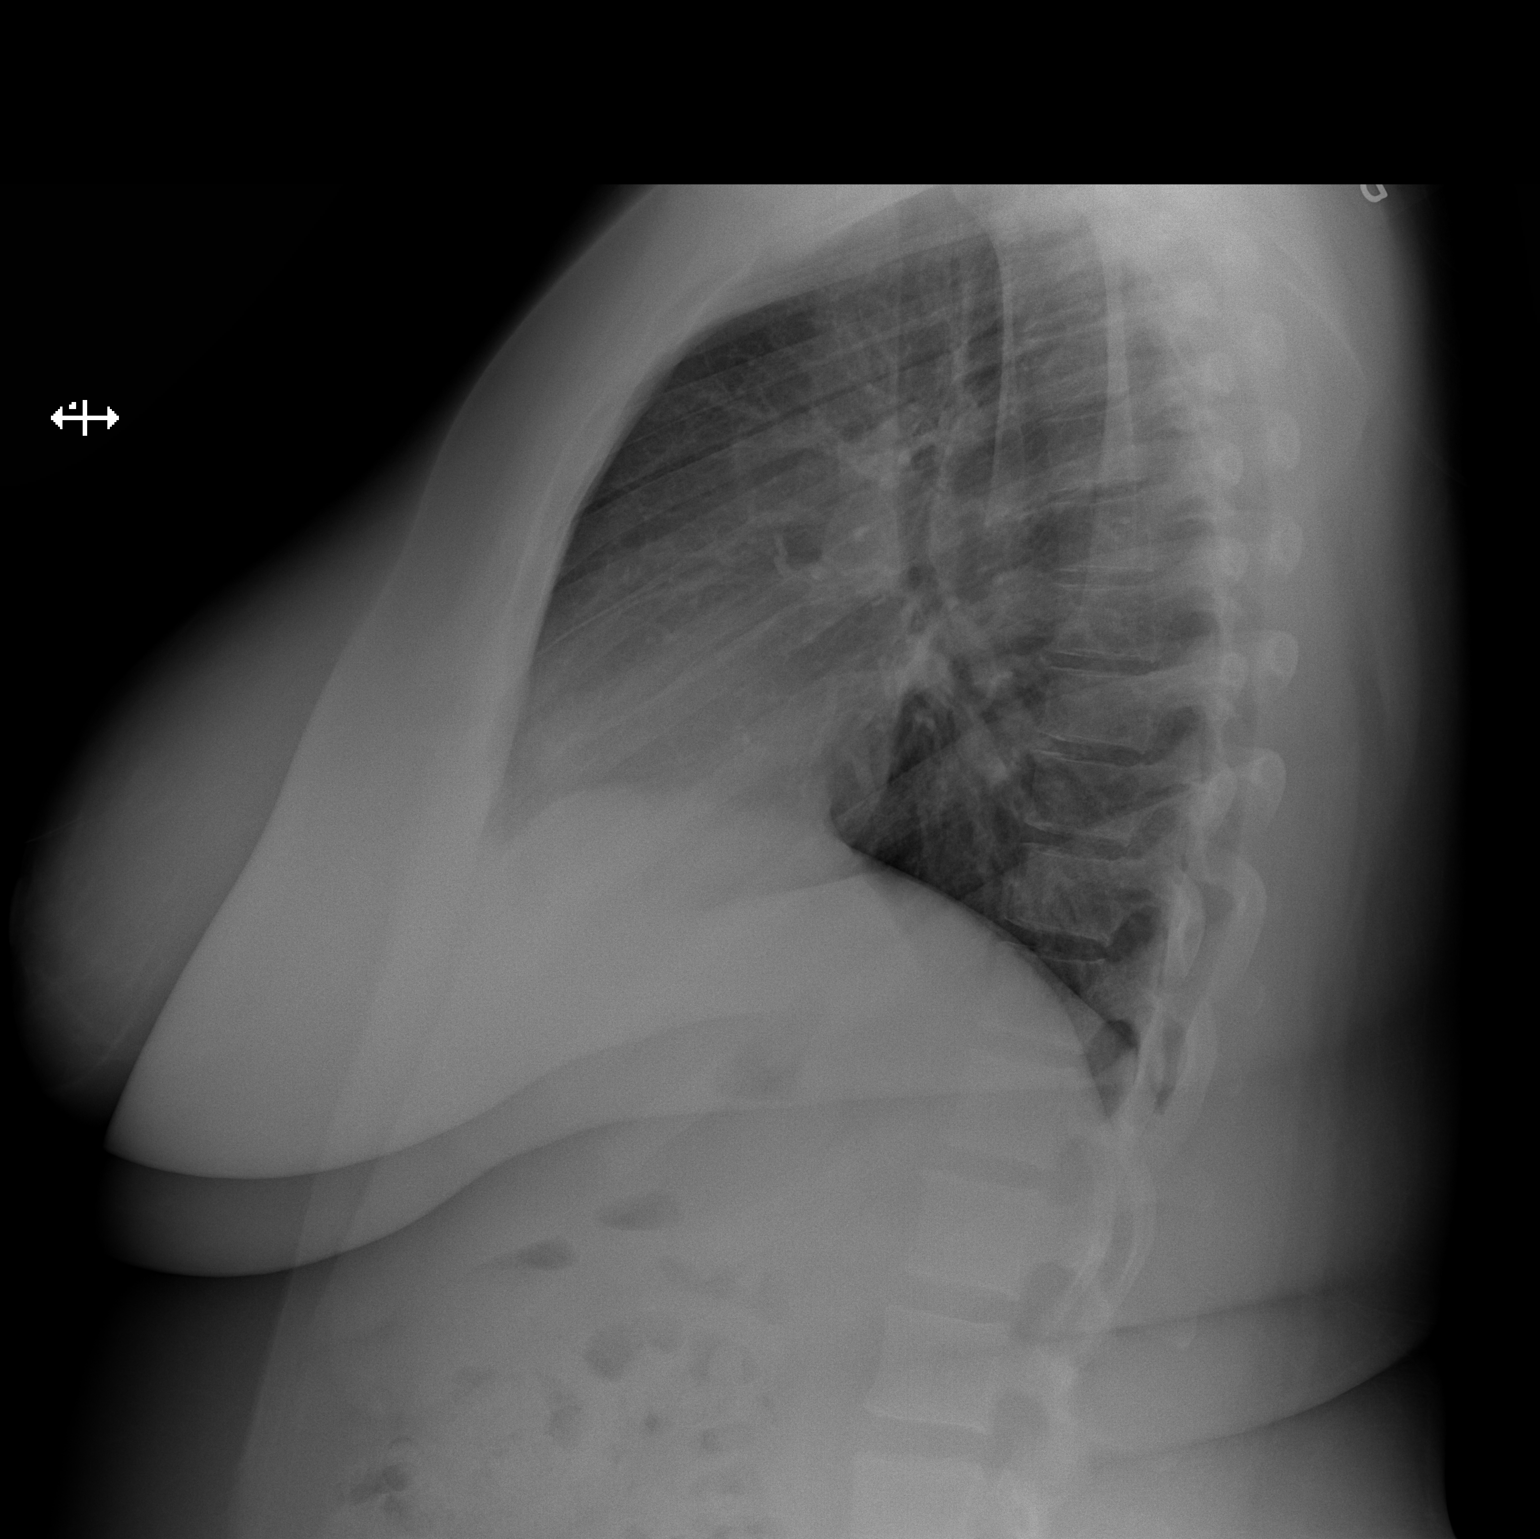

[w chest pa]
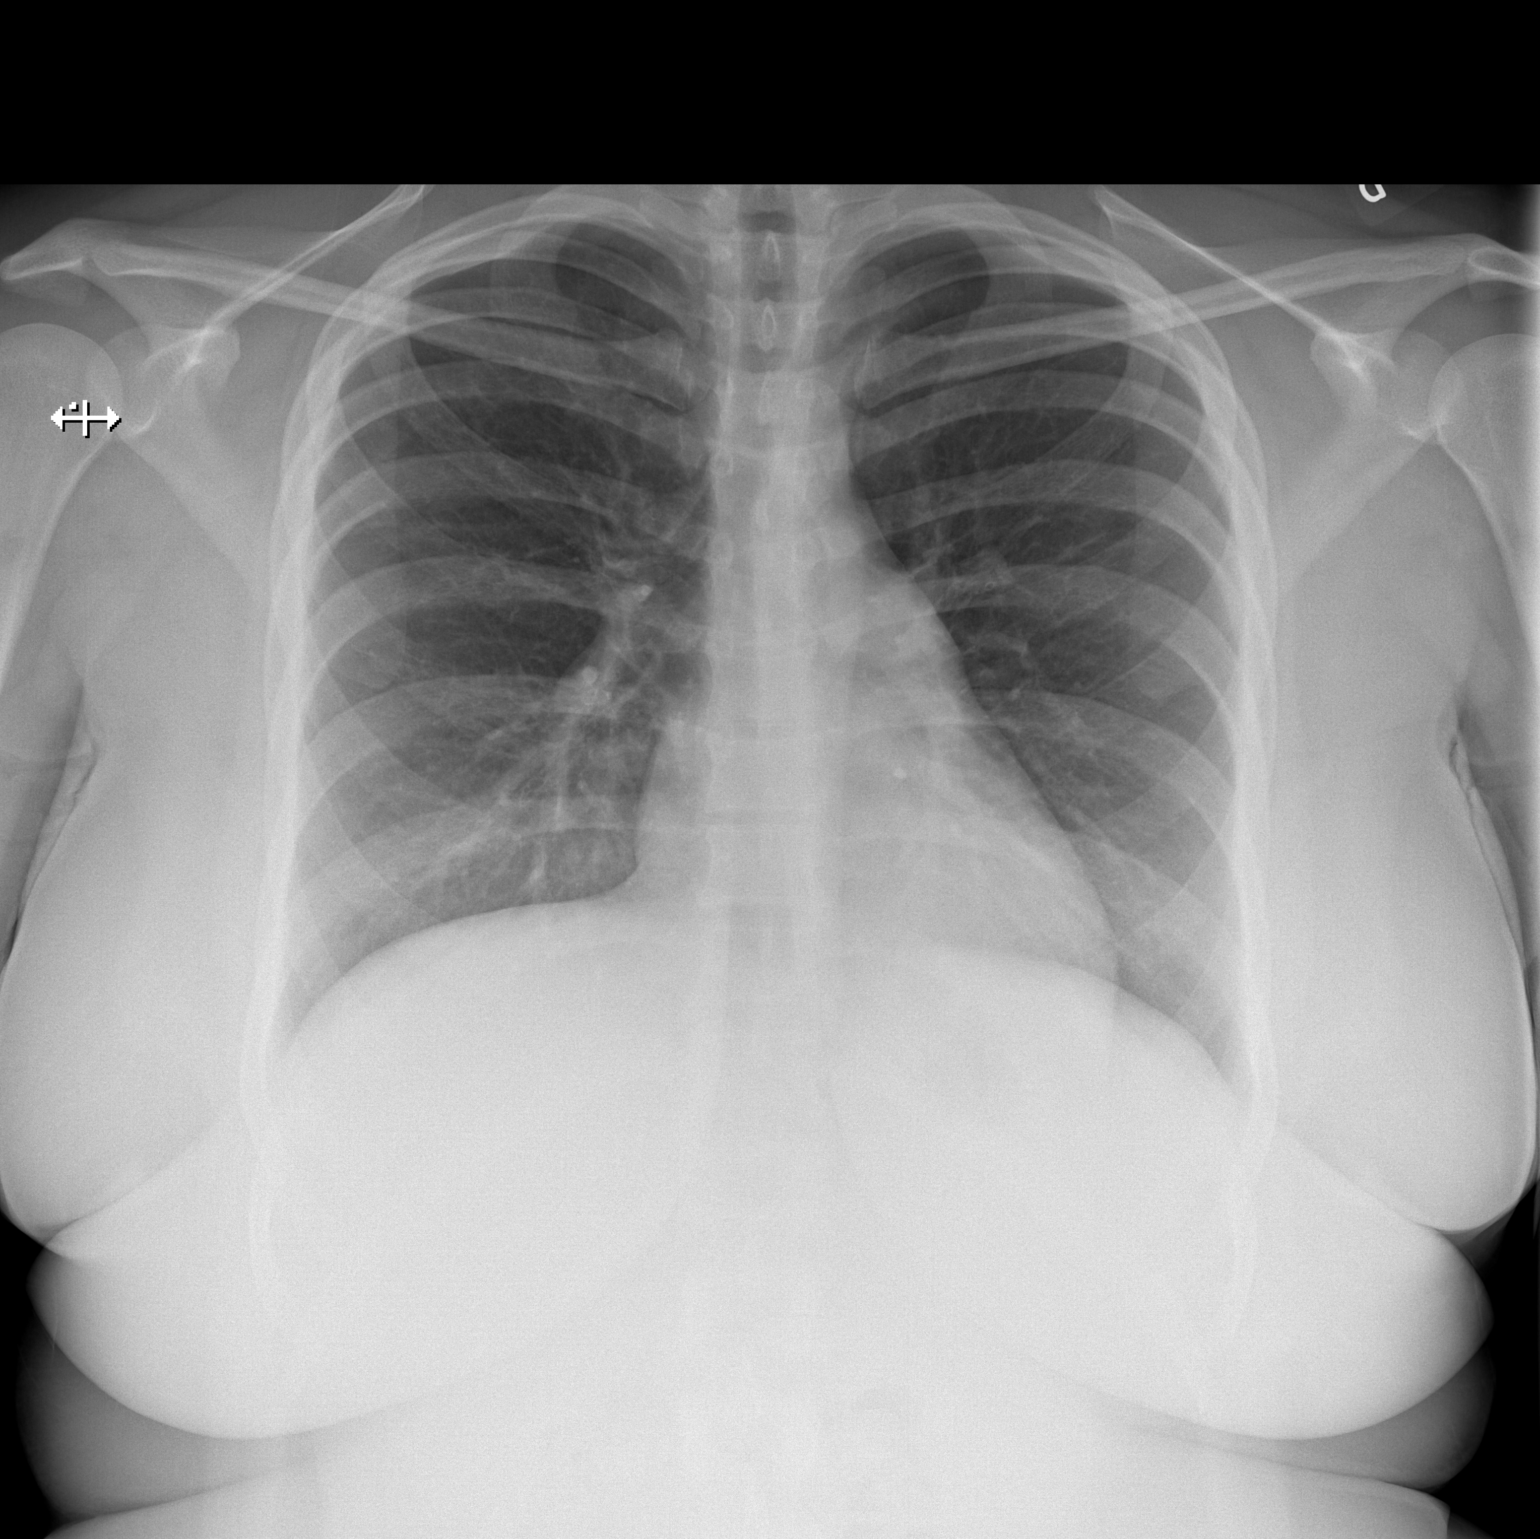

[2 of 2 positions shown; findings below may reference images not displayed]

FINDINGS: The heart size and mediastinal contours are within normal limits.
Both lungs are clear. The visualized skeletal structures are
unremarkable.
IMPRESSION: No active cardiopulmonary disease.

## 2015-10-12 IMAGING — CR DG SHOULDER 2+V*R*
3 series · 3 of 3 positions shown · non-contrast
Comparison: None.

CLINICAL DATA: Acute right shoulder pain without known injury.
Initial encounter.

EXAM:
RIGHT SHOULDER - 2+ VIEW

[w shoulder external right]
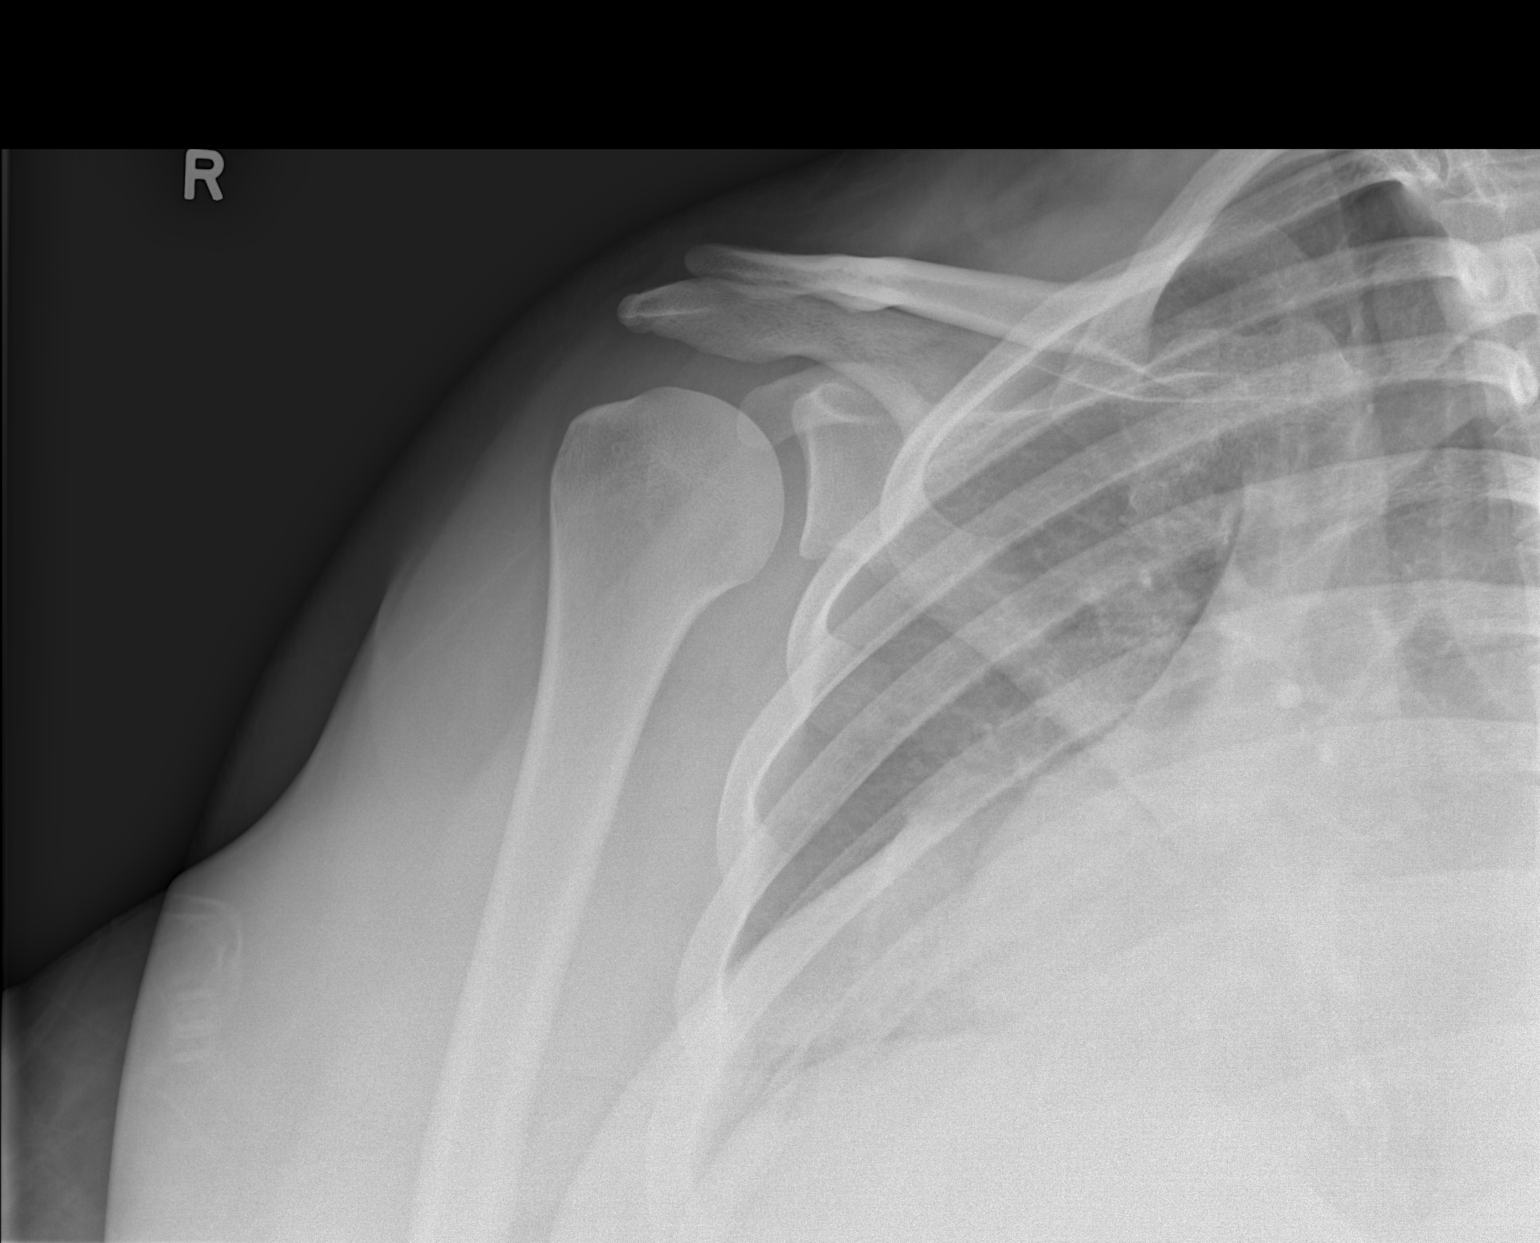

[w shoulder y-view right]
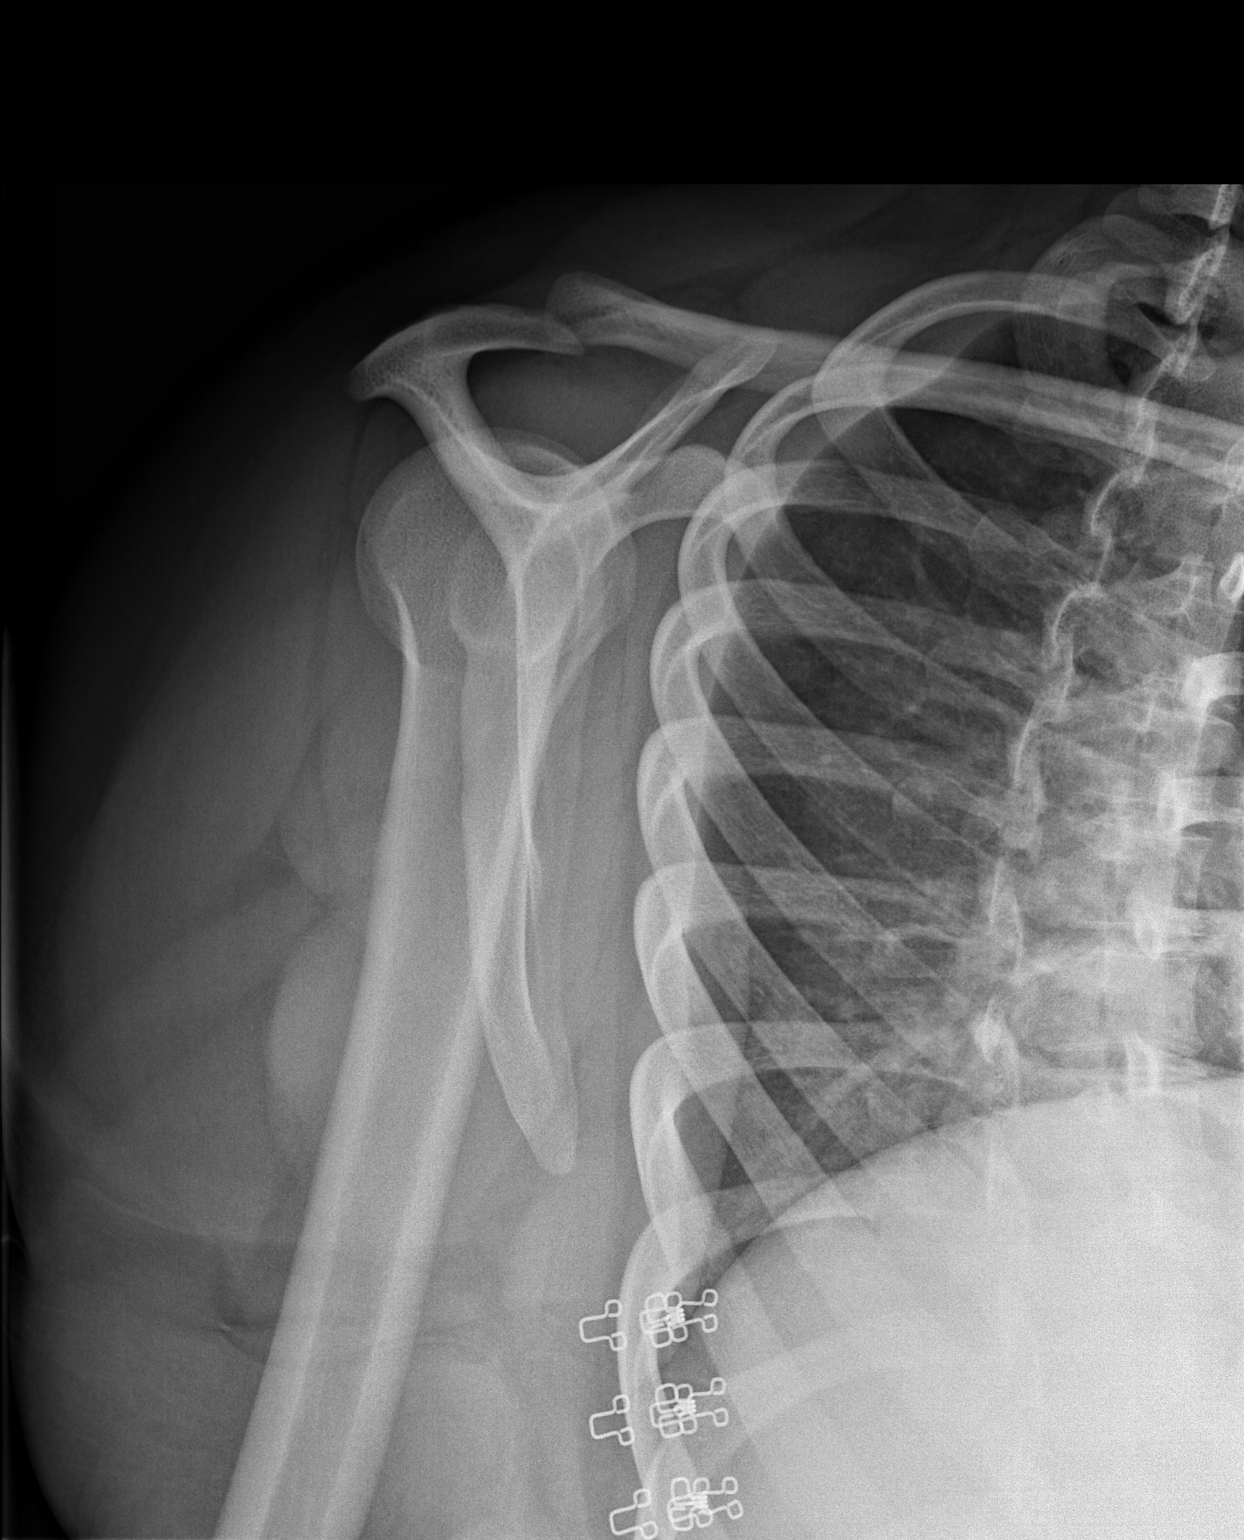

[w shoulder axillary right]
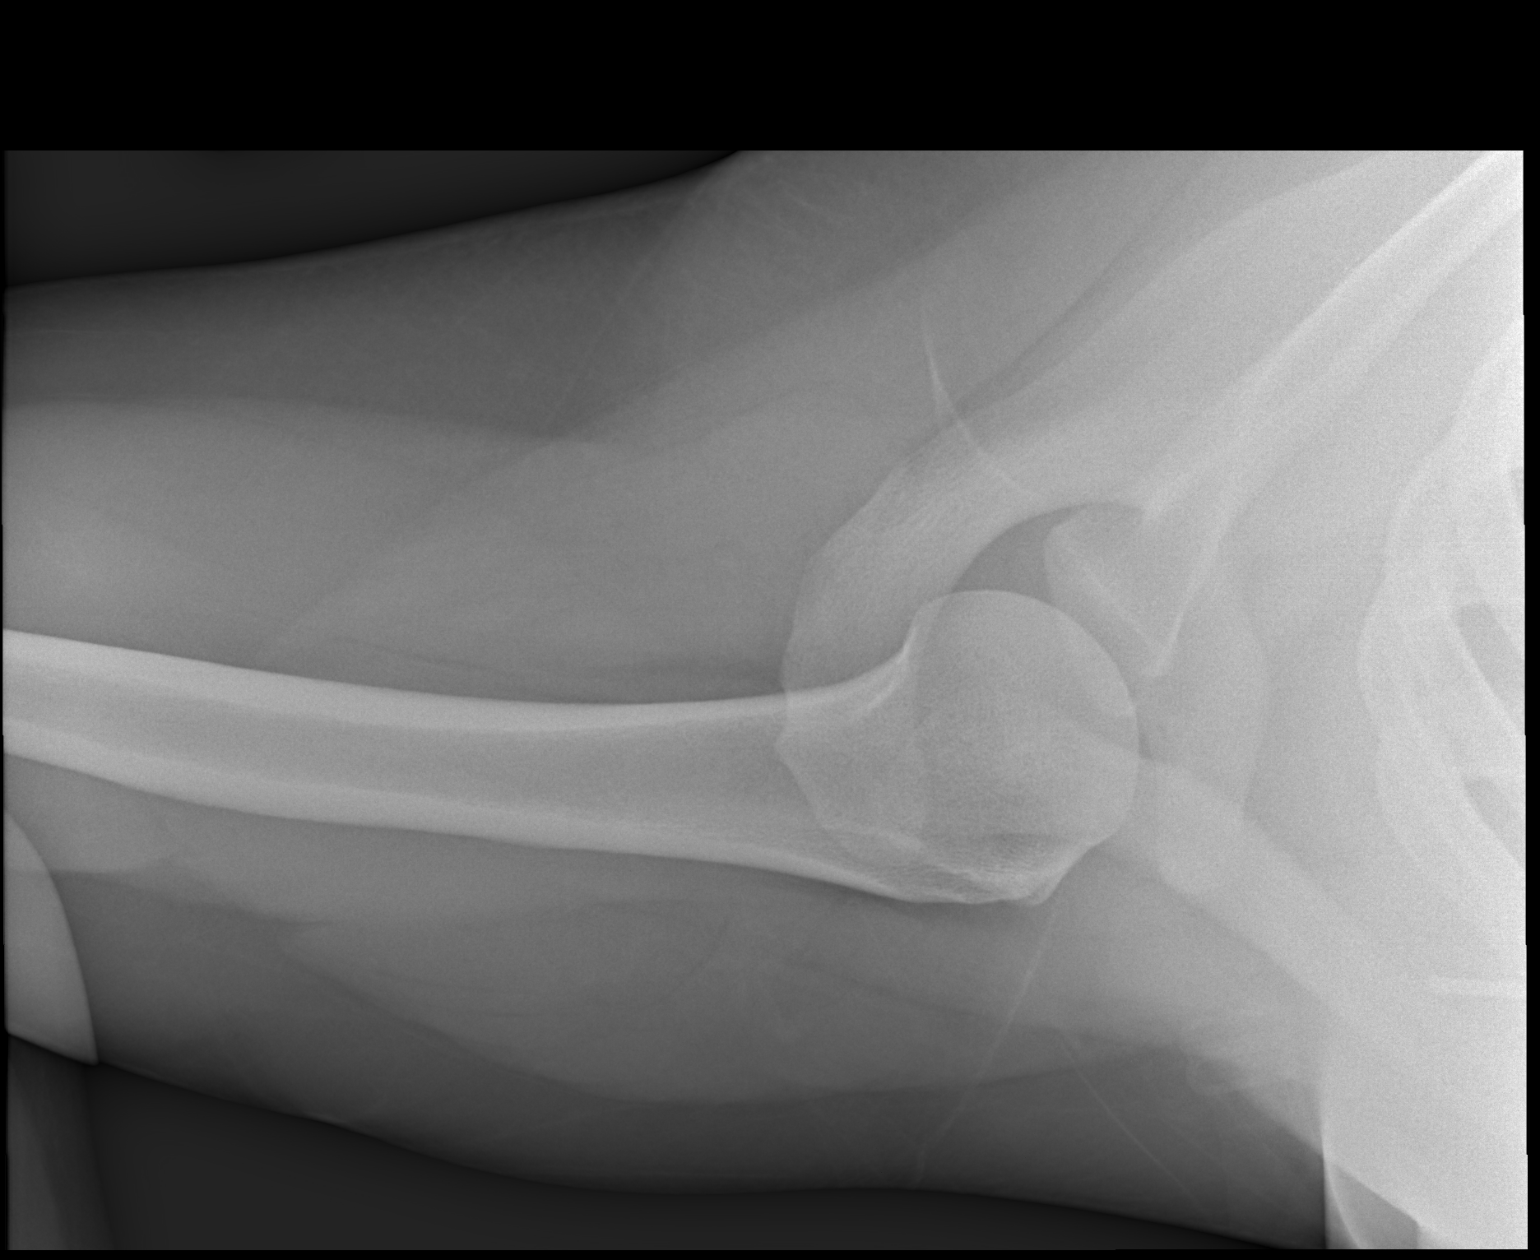

[3 of 3 positions shown; findings below may reference images not displayed]

FINDINGS: There is no evidence of fracture or dislocation. There is no
evidence of arthropathy or other focal bone abnormality. Soft
tissues are unremarkable.
IMPRESSION: Normal right shoulder.

## 2015-12-05 ENCOUNTER — Emergency Department (HOSPITAL_COMMUNITY)
Admission: EM | Admit: 2015-12-05 | Discharge: 2015-12-05 | Disposition: A | Discharge status: Home / Self Care | Payer: Medicaid Other | Attending: Emergency Medicine | Admitting: Emergency Medicine

## 2015-12-05 ENCOUNTER — Encounter (HOSPITAL_COMMUNITY): Payer: Self-pay | Admitting: *Deleted

## 2015-12-05 DIAGNOSIS — R197 Diarrhea, unspecified: Secondary | ICD-10-CM

## 2015-12-05 DIAGNOSIS — Z79899 Other long term (current) drug therapy: Secondary | ICD-10-CM | POA: Insufficient documentation

## 2015-12-05 DIAGNOSIS — F17210 Nicotine dependence, cigarettes, uncomplicated: Secondary | ICD-10-CM | POA: Insufficient documentation

## 2015-12-05 DIAGNOSIS — J45909 Unspecified asthma, uncomplicated: Secondary | ICD-10-CM | POA: Insufficient documentation

## 2015-12-05 MED ORDER — LOPERAMIDE HCL 2 MG PO CAPS: 2.0000 mg | ORAL_CAPSULE | Freq: Four times a day (QID) | ORAL | 0 refills | Status: DC | PRN

## 2015-12-05 MED ORDER — LOPERAMIDE HCL 2 MG PO CAPS
4.0000 mg | ORAL_CAPSULE | Freq: Once | ORAL | Status: AC
  Administered 2015-12-05: 4 mg via ORAL
  Filled 2015-12-05: qty 2

## 2015-12-05 MED ORDER — ONDANSETRON 4 MG PO TBDP: 4.0000 mg | ORAL_TABLET | Freq: Three times a day (TID) | ORAL | 0 refills | Status: DC | PRN

## 2015-12-05 MED ORDER — ONDANSETRON 4 MG PO TBDP
4.0000 mg | ORAL_TABLET | Freq: Once | ORAL | Status: AC
  Administered 2015-12-05: 4 mg via ORAL
  Filled 2015-12-05: qty 1

## 2015-12-05 NOTE — ED Provider Notes (Signed)
[facility]EMER[facility]Y DEPT Provider Note   CSN: [id number] Arrival date & time: 12/05/15  4098  By signing my name below, I, Soijett Blue, attest that this documentation has been prepared under the direction and in the presence of [name], MD. Electronically Signed: Soijett Blue, ED Scribe. 12/05/15. 2:28 AM.  History   Chief Complaint Chief Complaint  Patient presents with  . Diarrhea    HPI Tamara Bryant is a 25 y.o. female who presents to the Emergency Department complaining of diarrhea onset yesterday. Pt denies sick contacts. Pt reports that her manager at [employer] would like for her to have a doctors note to return to work due to her symptoms. Pt is having associated symptoms of nausea. She notes that she has not tried any medications for the relief of her symptoms. She denies blood in stool, vomiting, abdominal pain, fever, body aches, chills, appetite change, and any other symptoms. Pt states that she has a PMHx of asthma. Denies allergies to medications, recent travel or abx use. Pt denies having a PCP. Denies pregnancy at this time and notes that her LMP was a couple weeks ago.   The history is provided by the patient. No language interpreter was used.    Past Medical History:  Diagnosis Date  . Asthma   . Obesity     There are no active problems to display for this patient.   Past Surgical History:  Procedure Laterality Date  . WISDOM TOOTH EXTRACTION      OB History    Gravida Para Term Preterm AB Living   0             SAB TAB Ectopic Multiple Live Births                   Home Medications    Prior to Admission medications   Medication Sig Start Date End Date Taking? Authorizing Provider  albuterol (PROVENTIL HFA;VENTOLIN HFA) 108 (90 Base) MCG/ACT inhaler Inhale 1-2 puffs into the lungs every 6 (six) hours as needed for wheezing or shortness of breath.    Historical Provider, MD  amoxicillin (AMOXIL) 500 MG capsule Take 1 capsule (500 mg total) by  mouth 3 (three) times daily. 05/30/15   [name], MD  benzonatate (TESSALON) 100 MG capsule Take 2 capsules (200 mg total) by mouth 2 (two) times daily as needed for cough. 09/24/15   [name], PA-C  loperamide (IMODIUM) 2 MG capsule Take 1 capsule (2 mg total) by mouth 4 (four) times daily as needed for diarrhea or loose stools. 12/05/15   [name], MD  metroNIDAZOLE (FLAGYL) 500 MG tablet Take 1 tablet (500 mg total) by mouth 2 (two) times daily. One po bid x 7 days 05/30/15   [name], MD  ondansetron (ZOFRAN ODT) 4 MG disintegrating tablet Take 1 tablet (4 mg total) by mouth every 8 (eight) hours as needed for nausea or vomiting. 12/05/15   [name], MD  oxymetazoline (AFRIN NASAL SPRAY) 0.05 % nasal spray Place 1 spray into both nostrils 2 (two) times daily. Apply 1 spray to both nostrils twice daily for the next 3 days. Do not use for longer than 3 days to prevent rebound rhinorrhea. 09/24/15   [name], PA-C  promethazine (PHENERGAN) 25 MG tablet Take 1 tablet (25 mg total) by mouth every 6 (six) hours as needed for nausea or vomiting. 09/25/15   [name], PA-C  traMADol (ULTRAM) 50 MG  tablet Take 1 tablet (50 mg total) by mouth every 6 (six) hours as needed. 05/30/15   Gilda Creasehristopher J Pollina, MD    Family History Family History  Problem Relation Age of Onset  . Hypertension Mother   . Sickle cell anemia Mother   . Diabetes Father   . Hypertension Father   . Diabetes Maternal Grandmother   . Diabetes Paternal Grandfather     Social History Social History  Substance Use Topics  . Smoking status: Current Every Day Smoker    Packs/day: 1.00    Types: Cigarettes  . Smokeless tobacco: Never Used  . Alcohol use Yes     Comment: Occasional      Allergies   Patient has no known allergies.   Review of Systems Review of Systems  Constitutional: Negative for chills and fever.  Gastrointestinal: Positive for  diarrhea and nausea. Negative for abdominal pain, blood in stool and vomiting.  Musculoskeletal: Negative for myalgias.     Physical Exam Updated Vital Signs BP 125/85   Pulse 112   Temp 98.4 F (36.9 C) (Oral)   Resp 17   Ht 5\' 4"  (1.626 m)   Wt 246 lb (111.6 kg)   LMP 11/26/2015   SpO2 99%   BMI 42.23 kg/m   Physical Exam  Constitutional: She is oriented to person, place, and time. No distress.  Obese  HENT:  Head: Normocephalic and atraumatic.  Mucous membranes moist  Cardiovascular: Normal rate, regular rhythm and normal heart sounds.   Pulmonary/Chest: Effort normal and breath sounds normal. No respiratory distress. She has no wheezes.  Abdominal: Soft. Bowel sounds are normal. There is no tenderness. There is no guarding.  Neurological: She is alert and oriented to person, place, and time.  Skin: Skin is warm and dry.  Psychiatric: She has a normal mood and affect.  Nursing note and vitals reviewed.    ED Treatments / Results  DIAGNOSTIC STUDIES: Oxygen Saturation is 99% on RA, nl by my interpretation.    COORDINATION OF CARE: 2:27 AM Discussed treatment plan with pt at bedside which includes imodium Rx, zofran Rx and pt agreed to plan.  Procedures Procedures (including critical care time)  Medications Ordered in ED Medications  ondansetron (ZOFRAN-ODT) disintegrating tablet 4 mg (not administered)  loperamide (IMODIUM) capsule 4 mg (not administered)     Initial Impression / Assessment and Plan / ED Course  I have reviewed the triage vital signs and the nursing notes.  Clinical Course     Patient presents with diarrhea and nausea. Nontoxic. Vital signs reassuring. Denies any other symptoms. Reports that she just came because she needs a note for work. No abdominal pain.  Patient given Zofran and Imodium. She was provided a work note. No risk factors for infectious or antibiotic related diarrhea. Symptom control. Good handwashing.  After history,  exam, and medical workup I feel the patient has been appropriately medically screened and is safe for discharge home. Pertinent diagnoses were discussed with the patient. Patient was given return precautions.   Final Clinical Impressions(s) / ED Diagnoses   Final diagnoses:  Diarrhea, unspecified type    New Prescriptions New Prescriptions   LOPERAMIDE (IMODIUM) 2 MG CAPSULE    Take 1 capsule (2 mg total) by mouth 4 (four) times daily as needed for diarrhea or loose stools.   ONDANSETRON (ZOFRAN ODT) 4 MG DISINTEGRATING TABLET    Take 1 tablet (4 mg total) by mouth every 8 (eight) hours as needed for nausea  or vomiting.   I personally performed the services described in this documentation, which was scribed in my presence. The recorded information has been reviewed and is accurate.    Shon Batonourtney F Horton, MD 12/05/15 [phone]

## 2015-12-05 NOTE — ED Triage Notes (Signed)
The pt has had diarrhea and nausea since yesterday she was made to come in to be checked she works at [employer] and she has to have a note before she can go back to work  lmp 2 weeks ago

## 2016-01-02 ENCOUNTER — Encounter (HOSPITAL_COMMUNITY): Payer: Self-pay | Admitting: *Deleted

## 2016-01-02 ENCOUNTER — Emergency Department (HOSPITAL_COMMUNITY)
Admission: EM | Admit: 2016-01-02 | Discharge: 2016-01-02 | Disposition: A | Discharge status: Home / Self Care | Payer: Medicaid Other | Attending: Emergency Medicine | Admitting: Emergency Medicine

## 2016-01-02 DIAGNOSIS — Z79899 Other long term (current) drug therapy: Secondary | ICD-10-CM | POA: Insufficient documentation

## 2016-01-02 DIAGNOSIS — J45909 Unspecified asthma, uncomplicated: Secondary | ICD-10-CM | POA: Insufficient documentation

## 2016-01-02 DIAGNOSIS — F17210 Nicotine dependence, cigarettes, uncomplicated: Secondary | ICD-10-CM | POA: Insufficient documentation

## 2016-01-02 DIAGNOSIS — R197 Diarrhea, unspecified: Secondary | ICD-10-CM | POA: Insufficient documentation

## 2016-01-02 NOTE — ED Provider Notes (Signed)
[facility] Provider Note   CSN: [id number] Arrival date & time: 01/02/16  1741     History   Chief Complaint Chief Complaint  Patient presents with  . Diarrhea    HPI   Blood pressure 115/85, pulse 71, temperature 97.8 F (36.6 C), temperature source Oral, resp. rate 22, last menstrual period 11/10/2015, SpO2 100 %.  Tamara Bryant is a 25 y.o. female complaining of Watery diarrhea approximately 3 episodes per day starting yesterday and persisting today. She denies abdominal pain, nausea, vomiting, fever, chills, change in urination. 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   Past Medical History:  Diagnosis Date  . Asthma   . Obesity     There are no active problems to display for this patient.   Past Surgical History:  Procedure Laterality Date  . WISDOM TOOTH EXTRACTION      OB History    Gravida Para Term Preterm AB Living   0             SAB TAB Ectopic Multiple Live Births                   Home Medications    Prior to Admission medications   Medication Sig Start Date End Date Taking? Authorizing Provider  albuterol (PROVENTIL HFA;VENTOLIN HFA) 108 (90 Base) MCG/ACT inhaler Inhale 1-2 puffs into the lungs every 6 (six) hours as needed for wheezing or shortness of breath.    Historical Provider, MD  amoxicillin (AMOXIL) 500 MG capsule Take 1 capsule (500 mg total) by mouth 3 (three) times daily. 05/30/15   Gilda Creasehristopher J Pollina, MD  benzonatate (TESSALON) 100 MG capsule Take 2 capsules (200 mg total) by mouth 2 (two) times daily as needed for cough. 09/24/15   Barrett HenleNicole Elizabeth Nadeau, PA-C  loperamide (IMODIUM) 2 MG capsule Take 1 capsule (2 mg total) by mouth 4 (four) times daily as needed for diarrhea or loose stools. 12/05/15   Shon Batonourtney F Horton, MD  metroNIDAZOLE (FLAGYL) 500 MG tablet Take 1 tablet (500 mg total) by mouth 2 (two) times daily. One po bid x 7 days 05/30/15   Gilda Creasehristopher J Pollina, MD  ondansetron (ZOFRAN ODT) 4 MG  disintegrating tablet Take 1 tablet (4 mg total) by mouth every 8 (eight) hours as needed for nausea or vomiting. 12/05/15   Shon Batonourtney F Horton, MD  oxymetazoline (AFRIN NASAL SPRAY) 0.05 % nasal spray Place 1 spray into both nostrils 2 (two) times daily. Apply 1 spray to both nostrils twice daily for the next 3 days. Do not use for longer than 3 days to prevent rebound rhinorrhea. 09/24/15   Barrett HenleNicole Elizabeth Nadeau, PA-C  promethazine (PHENERGAN) 25 MG tablet Take 1 tablet (25 mg total) by mouth every 6 (six) hours as needed for nausea or vomiting. 09/25/15   Joni ReiningNicole Jacqueli Pangallo, PA-C  traMADol (ULTRAM) 50 MG tablet Take 1 tablet (50 mg total) by mouth every 6 (six) hours as needed. 05/30/15   Gilda Creasehristopher J Pollina, MD    Family History Family History  Problem Relation Age of Onset  . Hypertension Mother   . Sickle cell anemia Mother   . Diabetes Father   . Hypertension Father   . Diabetes Maternal Grandmother   . Diabetes Paternal Grandfather     Social History Social History  Substance Use Topics  . Smoking status: Current Every Day Smoker    Packs/day: 1.00    Types: Cigarettes  . Smokeless tobacco: Never Used  .  Alcohol use Yes     Comment: Occasional      Allergies   Patient has no known allergies.   Review of Systems Review of Systems  10 systems reviewed and found to be negative, except as noted in the HPI.   Physical Exam Updated Vital Signs BP 124/77   Pulse 70   Temp 97.8 F (36.6 C) (Oral)   Resp 22   LMP 11/10/2015   SpO2 100%   Physical Exam  Constitutional: She is oriented to person, place, and time. She appears well-developed and well-nourished. No distress.  HENT:  Head: Normocephalic and atraumatic.  Mouth/Throat: Oropharynx is clear and moist.  Eyes: Conjunctivae and EOM are normal. Pupils are equal, round, and reactive to light.  Neck: Normal range of motion.  Cardiovascular: Normal rate, regular rhythm and intact distal pulses.  Exam reveals no  gallop and no friction rub.   No murmur heard. Pulmonary/Chest: Effort normal and breath sounds normal. No respiratory distress. She has no wheezes. She has no rales. She exhibits no tenderness.  Abdominal: Soft. She exhibits no distension and no mass. There is no tenderness. There is no rebound and no guarding. No hernia.  Musculoskeletal: Normal range of motion.  Neurological: She is alert and oriented to person, place, and time.  Skin: She is not diaphoretic.  Psychiatric: She has a normal mood and affect.  Nursing note and vitals reviewed.    ED Treatments / Results  Labs (all labs ordered are listed, but only abnormal results are displayed) Labs Reviewed - No data to display  EKG  EKG Interpretation None       Radiology No results found.  Procedures Procedures (including critical care time)  Medications Ordered in ED Medications - No data to display   Initial Impression / Assessment and Plan / ED Course  I have reviewed the triage vital signs and the nursing notes.  Pertinent labs & imaging results that were available during my care of the patient were reviewed by me and considered in my medical decision making (see chart for details).  Clinical Course     Vitals:   01/02/16 1826 01/02/16 1830 01/02/16 1845 01/02/16 1900  BP:  112/80 120/99 124/77  Pulse: 71 67 71 70  Resp:      Temp:      TempSrc:      SpO2: 100% 100% 100% 100%    Tamara Bryant is 25 y.o. female presenting with Watery diarrhea onset yesterday, abdominal exam is benign, patient nontoxic appearing. Vital signs without abnormality, she is requesting a note to return to work but advised her she will need to stay home a she is likely contagious. Work note given and advised not to return until all symptoms completely resolved.  Evaluation does not show pathology that would require ongoing emergent intervention or inpatient treatment. Pt is hemodynamically stable and mentating appropriately.  Discussed findings and plan with patient/guardian, who agrees with care plan. All questions answered. Return precautions discussed and outpatient follow up given.    Final Clinical Impressions(s) / ED Diagnoses   Final diagnoses:  Diarrhea, unspecified type    New Prescriptions New Prescriptions   No medications on file     Wynetta Emeryicole Kaci Dillie, PA-C 01/02/16 1913    Benjiman CoreNathan Pickering, MD 01/02/16 [id number]

## 2016-01-02 NOTE — Discharge Instructions (Signed)
Please follow with your primary care doctor in the next 2 days for a check-up. They must obtain records for further management.  ° °Do not hesitate to return to the Emergency Department for any new, worsening or concerning symptoms.  ° °

## 2016-01-02 NOTE — ED Triage Notes (Signed)
The pt is c/o having diarrhea since yesterday  No pain no n v lmp  3 weeks ago

## 2016-03-04 ENCOUNTER — Emergency Department (HOSPITAL_COMMUNITY)
Admission: EM | Admit: 2016-03-04 | Discharge: 2016-03-04 | Disposition: A | Discharge status: Home / Self Care | Payer: Medicaid Other | Attending: Emergency Medicine | Admitting: Emergency Medicine

## 2016-03-04 ENCOUNTER — Encounter (HOSPITAL_COMMUNITY): Payer: Self-pay | Admitting: *Deleted

## 2016-03-04 DIAGNOSIS — R519 Headache, unspecified: Secondary | ICD-10-CM

## 2016-03-04 DIAGNOSIS — J45909 Unspecified asthma, uncomplicated: Secondary | ICD-10-CM | POA: Insufficient documentation

## 2016-03-04 DIAGNOSIS — R51 Headache: Secondary | ICD-10-CM | POA: Insufficient documentation

## 2016-03-04 DIAGNOSIS — R590 Localized enlarged lymph nodes: Secondary | ICD-10-CM | POA: Insufficient documentation

## 2016-03-04 DIAGNOSIS — F17210 Nicotine dependence, cigarettes, uncomplicated: Secondary | ICD-10-CM | POA: Insufficient documentation

## 2016-03-04 MED ORDER — SODIUM CHLORIDE 0.9 % IV BOLUS (SEPSIS)
1000.0000 mL | Freq: Once | INTRAVENOUS | Status: AC
  Administered 2016-03-04: 1000 mL via INTRAVENOUS

## 2016-03-04 MED ORDER — PREDNISONE 20 MG PO TABS: 40.0000 mg | ORAL_TABLET | Freq: Every day | ORAL | 0 refills | Status: DC

## 2016-03-04 MED ORDER — KETOROLAC TROMETHAMINE 30 MG/ML IJ SOLN
15.0000 mg | Freq: Once | INTRAMUSCULAR | Status: AC
  Administered 2016-03-04: 15 mg via INTRAVENOUS
  Filled 2016-03-04: qty 1

## 2016-03-04 MED ORDER — DEXAMETHASONE SODIUM PHOSPHATE 10 MG/ML IJ SOLN
10.0000 mg | Freq: Once | INTRAMUSCULAR | Status: AC
  Administered 2016-03-04: 10 mg via INTRAVENOUS
  Filled 2016-03-04: qty 1

## 2016-03-04 NOTE — ED Triage Notes (Signed)
Pt reports having a headache x 5 days and "has knots behind both ears." has nausea and sensitivity to light. Denies hx of migraines.

## 2016-03-04 NOTE — Discharge Instructions (Signed)
As discussed, your evaluation today has been largely reassuring.  But, it is important that you monitor your condition carefully, and do not hesitate to return to the ED if you develop new, or concerning changes in your condition. ? ?Otherwise, please follow-up with your physician for appropriate ongoing care. ? ?

## 2016-03-04 NOTE — ED Notes (Signed)
Pt c/o headache x's 5 days.  Also c/o knot behind right ear.  Pt alert and oriented x's 3.  Skin warm and dry, color appropriate

## 2016-03-04 NOTE — ED Provider Notes (Signed)
[facility] Provider Note   CSN: [id number] Arrival date & time: 03/04/16  1722     History   Chief Complaint Chief Complaint  Patient presents with  . Headache    HPI Tamara Bryant is a 26 y.o. female.  HPI Patient presents with concern of headache. Patient does have headaches recently, but has no history of migraines. She notes over the past 2 days as she has had increasing soreness in her posterior neck current focal lesions, she has also developed diffuse headache discomfort. She denies other new complaints including fever, neck stiffness, vision loss, confusion, disorientation, nausea, vomiting. No recent illness or No recent medication changes, diet changes, travel. No relief with OTC medication.  Past Medical History:  Diagnosis Date  . Asthma   . Obesity     There are no active problems to display for this patient.   Past Surgical History:  Procedure Laterality Date  . WISDOM TOOTH EXTRACTION      OB History    Gravida Para Term Preterm AB Living   0             SAB TAB Ectopic Multiple Live Births                   Home Medications    Prior to Admission medications   Medication Sig Start Date End Date Taking? Authorizing Provider  albuterol (PROVENTIL HFA;VENTOLIN HFA) 108 (90 Base) MCG/ACT inhaler Inhale 1-2 puffs into the lungs every 6 (six) hours as needed for wheezing or shortness of breath.    Historical Provider, MD  amoxicillin (AMOXIL) 500 MG capsule Take 1 capsule (500 mg total) by mouth 3 (three) times daily. 05/30/15   Gilda Creasehristopher J Pollina, MD  benzonatate (TESSALON) 100 MG capsule Take 2 capsules (200 mg total) by mouth 2 (two) times daily as needed for cough. 09/24/15   Barrett HenleNicole Elizabeth Nadeau, PA-C  loperamide (IMODIUM) 2 MG capsule Take 1 capsule (2 mg total) by mouth 4 (four) times daily as needed for diarrhea or loose stools. 12/05/15   Shon Batonourtney F Horton, MD  metroNIDAZOLE (FLAGYL) 500 MG tablet Take 1 tablet (500 mg total) by  mouth 2 (two) times daily. One po bid x 7 days 05/30/15   Gilda Creasehristopher J Pollina, MD  ondansetron (ZOFRAN ODT) 4 MG disintegrating tablet Take 1 tablet (4 mg total) by mouth every 8 (eight) hours as needed for nausea or vomiting. 12/05/15   Shon Batonourtney F Horton, MD  oxymetazoline (AFRIN NASAL SPRAY) 0.05 % nasal spray Place 1 spray into both nostrils 2 (two) times daily. Apply 1 spray to both nostrils twice daily for the next 3 days. Do not use for longer than 3 days to prevent rebound rhinorrhea. 09/24/15   Barrett HenleNicole Elizabeth Nadeau, PA-C  promethazine (PHENERGAN) 25 MG tablet Take 1 tablet (25 mg total) by mouth every 6 (six) hours as needed for nausea or vomiting. 09/25/15   Joni ReiningNicole Pisciotta, PA-C  traMADol (ULTRAM) 50 MG tablet Take 1 tablet (50 mg total) by mouth every 6 (six) hours as needed. 05/30/15   Gilda Creasehristopher J Pollina, MD    Family History Family History  Problem Relation Age of Onset  . Hypertension Mother   . Sickle cell anemia Mother   . Diabetes Father   . Hypertension Father   . Diabetes Maternal Grandmother   . Diabetes Paternal Grandfather     Social History Social History  Substance Use Topics  . Smoking status: Current Every Day Smoker  Packs/day: 1.00    Types: Cigarettes  . Smokeless tobacco: Never Used  . Alcohol use Yes     Comment: Occasional      Allergies   Patient has no known allergies.   Review of Systems Review of Systems  Constitutional:       Per HPI, otherwise negative  HENT:       Per HPI, otherwise negative  Eyes: Negative for photophobia.  Respiratory:       Per HPI, otherwise negative  Cardiovascular:       Per HPI, otherwise negative  Gastrointestinal: Negative for vomiting.  Endocrine:       Negative aside from HPI  Genitourinary:       Neg aside from HPI   Musculoskeletal:       Per HPI, otherwise negative  Skin: Negative.   Allergic/Immunologic: Negative for immunocompromised state.  Neurological: Positive for headaches.  Negative for syncope and weakness.     Physical Exam Updated Vital Signs BP 121/90   Pulse 92   Temp 98.7 F (37.1 C) (Oral)   Resp 16   Ht 5\' 4"  (1.626 m)   Wt 246 lb 12.8 oz (111.9 kg)   LMP 02/15/2016   SpO2 100%   BMI 42.36 kg/m   Physical Exam  Constitutional: She is oriented to person, place, and time. She appears well-developed and well-nourished. No distress.  HENT:  Head: Normocephalic and atraumatic.  Eyes: Conjunctivae and EOM are normal.  Neck: No tracheal tenderness, no spinous process tenderness and no muscular tenderness present. No neck rigidity. No tracheal deviation, no edema, no erythema and normal range of motion present. No Kernig's sign noted.  Cardiovascular: Normal rate and regular rhythm.   Pulmonary/Chest: Effort normal and breath sounds normal. No stridor. No respiratory distress.  Abdominal: She exhibits no distension.  Musculoskeletal: She exhibits no edema.  Lymphadenopathy:  Appreciable symmetric bilateral posterior cervical nodes, and posterior auricular lymph nodes, minimally elevated  Neurological: She is alert and oriented to person, place, and time. She displays no atrophy and no tremor. No cranial nerve deficit. She exhibits normal muscle tone. She displays no seizure activity. Coordination normal.  Skin: Skin is warm and dry.  Psychiatric: She has a normal mood and affect.  Nursing note and vitals reviewed.    ED Treatments / Results   Procedures Procedures (including critical care time)  Medications Ordered in ED Medications  sodium chloride 0.9 % bolus 1,000 mL (1,000 mLs Intravenous New Bag/Given 03/04/16 1936)  ketorolac (TORADOL) 30 MG/ML injection 15 mg (15 mg Intravenous Given 03/04/16 1936)  dexamethasone (DECADRON) injection 10 mg (10 mg Intravenous Given 03/04/16 2005)   9:10 PM HA resolved  Initial Impression / Assessment and Plan / ED Course  I have reviewed the triage vital signs and the nursing notes.  Pertinent labs  & imaging results that were available during my care of the patient were reviewed by me and considered in my medical decision making (see chart for details).  Young female presents with new posterior cervical adenopathy, and head soreness. Patient has no neurologic deficits, no fever, no evidence for meningitis, intracranial lesion given the reassuring physical exam, neurologic exam. With adenopathy, there is some suspicion for inflammatory condition. Patient received anti-inflammatories, fluids, with improvement in her symptoms. Patient discharged in stable condition.  Final Clinical Impressions(s) / ED Diagnoses  Bad headache   [name], MD 03/04/16 2110

## 2016-04-05 ENCOUNTER — Encounter (HOSPITAL_COMMUNITY): Payer: Self-pay

## 2016-04-05 DIAGNOSIS — N898 Other specified noninflammatory disorders of vagina: Secondary | ICD-10-CM | POA: Insufficient documentation

## 2016-04-05 DIAGNOSIS — J45909 Unspecified asthma, uncomplicated: Secondary | ICD-10-CM | POA: Insufficient documentation

## 2016-04-05 DIAGNOSIS — F17210 Nicotine dependence, cigarettes, uncomplicated: Secondary | ICD-10-CM | POA: Insufficient documentation

## 2016-04-05 DIAGNOSIS — A599 Trichomoniasis, unspecified: Secondary | ICD-10-CM | POA: Insufficient documentation

## 2016-04-05 NOTE — ED Triage Notes (Signed)
Pt reports dysuria with urination that started yesterday. Pt reports white colored discharge with an odor.

## 2016-04-06 ENCOUNTER — Emergency Department (HOSPITAL_COMMUNITY)
Admission: EM | Admit: 2016-04-06 | Discharge: 2016-04-06 | Disposition: A | Discharge status: Home / Self Care | Payer: Medicaid Other | Attending: Emergency Medicine | Admitting: Emergency Medicine

## 2016-04-06 DIAGNOSIS — N898 Other specified noninflammatory disorders of vagina: Secondary | ICD-10-CM

## 2016-04-06 DIAGNOSIS — R300 Dysuria: Secondary | ICD-10-CM

## 2016-04-06 DIAGNOSIS — A599 Trichomoniasis, unspecified: Secondary | ICD-10-CM

## 2016-04-06 LAB — URINALYSIS, ROUTINE W REFLEX MICROSCOPIC
Bacteria, UA: NONE SEEN
Bilirubin Urine: NEGATIVE
GLUCOSE, UA: NEGATIVE mg/dL
Ketones, ur: NEGATIVE mg/dL
NITRITE: NEGATIVE
Protein, ur: NEGATIVE mg/dL
SPECIFIC GRAVITY, URINE: 1.035 — AB (ref 1.005–1.030)
pH: 5 (ref 5.0–8.0)

## 2016-04-06 LAB — WET PREP, GENITAL
Sperm: NONE SEEN
Yeast Wet Prep HPF POC: NONE SEEN

## 2016-04-06 LAB — HIV ANTIBODY (ROUTINE TESTING W REFLEX): HIV Screen 4th Generation wRfx: NONREACTIVE

## 2016-04-06 LAB — PREGNANCY, URINE: PREG TEST UR: NEGATIVE

## 2016-04-06 LAB — GC/CHLAMYDIA PROBE AMP (~~LOC~~) NOT AT ARMC
Chlamydia: NEGATIVE
NEISSERIA GONORRHEA: NEGATIVE

## 2016-04-06 LAB — RPR: RPR: NONREACTIVE

## 2016-04-06 MED ORDER — CEFTRIAXONE SODIUM 250 MG IJ SOLR
250.0000 mg | Freq: Once | INTRAMUSCULAR | Status: AC
  Administered 2016-04-06: 250 mg via INTRAMUSCULAR
  Filled 2016-04-06: qty 250

## 2016-04-06 MED ORDER — METRONIDAZOLE 500 MG PO TABS
2000.0000 mg | ORAL_TABLET | Freq: Once | ORAL | Status: AC
  Administered 2016-04-06: 2000 mg via ORAL
  Filled 2016-04-06: qty 4

## 2016-04-06 MED ORDER — AZITHROMYCIN 250 MG PO TABS
1000.0000 mg | ORAL_TABLET | Freq: Once | ORAL | Status: AC
Start: 2016-04-06 — End: 2016-04-06
  Administered 2016-04-06: 1000 mg via ORAL
  Filled 2016-04-06: qty 4

## 2016-04-06 MED ORDER — STERILE WATER FOR INJECTION IJ SOLN
INTRAMUSCULAR | Status: AC
  Administered 2016-04-06: 10 mL
  Filled 2016-04-06: qty 10

## 2016-04-06 NOTE — ED Notes (Signed)
Assisted PA with pelvic, pelvic tolerated well

## 2016-04-06 NOTE — ED Provider Notes (Signed)
[facility]EMER[facility]ENCY DEPT Provider Note   CSN: [id number] Arrival date & time: 04/05/16  2330     History   Chief Complaint Chief Complaint  Patient presents with  . Dysuria    HPI Tamara Bryant is a 26 y.o. female.  The history is provided by the patient and medical records. No language interpreter was used.  Dysuria   Pertinent negatives include no chills, no nausea and no vomiting.   Tamara Bryant is a 26 y.o. female  with a PMH of asthma who presents to the Emergency Department complaining of dysuria 2 days. Patient states that she drank as much water as possible throughout the day today and felt as if symptoms improved. She does note vaginal discharge which began today. She describes this as a thick white substance noted when wiping. Last menstrual period approximately 2-3 weeks ago. No vaginal bleeding. No abdominal pain, back pain, fevers/chills. No medications taken prior to arrival for symptoms. No aggravating factors noted. Recent unprotected intercourse.    Past Medical History:  Diagnosis Date  . Asthma   . Obesity     There are no active problems to display for this patient.   Past Surgical History:  Procedure Laterality Date  . WISDOM TOOTH EXTRACTION      OB History    Gravida Para Term Preterm AB Living   0             SAB TAB Ectopic Multiple Live Births                   Home Medications    Prior to Admission medications   Medication Sig Start Date End Date Taking? Authorizing Provider  albuterol (PROVENTIL HFA;VENTOLIN HFA) 108 (90 Base) MCG/ACT inhaler Inhale 1-2 puffs into the lungs every 6 (six) hours as needed for wheezing or shortness of breath.   Yes Historical Provider, MD  amoxicillin (AMOXIL) 500 MG capsule Take 1 capsule (500 mg total) by mouth 3 (three) times daily. Patient not taking: Reported on 04/06/2016 05/30/15   [name], MD  benzonatate (TESSALON) 100 MG capsule Take 2 capsules (200 mg total) by mouth 2 (two) times  daily as needed for cough. Patient not taking: Reported on 04/06/2016 09/24/15   [name], PA-C  loperamide (IMODIUM) 2 MG capsule Take 1 capsule (2 mg total) by mouth 4 (four) times daily as needed for diarrhea or loose stools. Patient not taking: Reported on 04/06/2016 12/05/15   [name], MD  metroNIDAZOLE (FLAGYL) 500 MG tablet Take 1 tablet (500 mg total) by mouth 2 (two) times daily. One po bid x 7 days Patient not taking: Reported on 04/06/2016 05/30/15   [name], MD  ondansetron (ZOFRAN ODT) 4 MG disintegrating tablet Take 1 tablet (4 mg total) by mouth every 8 (eight) hours as needed for nausea or vomiting. Patient not taking: Reported on 04/06/2016 12/05/15   [name], MD  oxymetazoline (AFRIN NASAL SPRAY) 0.05 % nasal spray Place 1 spray into both nostrils 2 (two) times daily. Apply 1 spray to both nostrils twice daily for the next 3 days. Do not use for longer than 3 days to prevent rebound rhinorrhea. Patient not taking: Reported on 04/06/2016 09/24/15   [name], PA-C  predniSONE (DELTASONE) 20 MG tablet Take 2 tablets (40 mg total) by mouth daily with breakfast. For the next four days Patient not taking: Reported on 04/06/2016 03/04/16   [name], MD  promethazine [facility])  25 MG tablet Take 1 tablet (25 mg total) by mouth every 6 (six) hours as needed for nausea or vomiting. Patient not taking: Reported on 04/06/2016 09/25/15   [name], PA-C  traMADol (ULTRAM) 50 MG tablet Take 1 tablet (50 mg total) by mouth every 6 (six) hours as needed. Patient not taking: Reported on 04/06/2016 05/30/15   [name], MD    Family History Family History  Problem Relation Age of Onset  . Hypertension Mother   . Sickle cell anemia Mother   . Diabetes Father   . Hypertension Father   . Diabetes Maternal Grandmother   . Diabetes Paternal Grandfather     Social History Social History  Substance Use Topics  . Smoking  status: Current Every Day Smoker    Packs/day: 1.00    Types: Cigarettes  . Smokeless tobacco: Never Used  . Alcohol use Yes     Comment: Occasional      Allergies   Patient has no known allergies.   Review of Systems Review of Systems  Constitutional: Negative for chills and fever.  HENT: Negative for congestion.   Eyes: Negative for visual disturbance.  Respiratory: Negative for cough and shortness of breath.   Cardiovascular: Negative for chest pain.  Gastrointestinal: Negative for abdominal pain, nausea and vomiting.  Genitourinary: Positive for dysuria and vaginal discharge. Negative for vaginal bleeding.  Musculoskeletal: Negative for back pain and neck pain.  Skin: Negative for rash.  Neurological: Negative for headaches.     Physical Exam Updated Vital Signs BP 104/81   Pulse 77   Temp 98.7 F (37.1 C) (Oral)   Resp 18   Ht  (1.626 m)   LMP 03/15/2016   SpO2 100%   Physical Exam  Constitutional: She is oriented to person, place, and time. She appears well-developed and well-nourished. No distress.  HENT:  Head: Normocephalic and atraumatic.  Cardiovascular: Normal rate, regular rhythm and normal heart sounds.   No murmur heard. Pulmonary/Chest: Effort normal and breath sounds normal. No respiratory distress.  Abdominal: Soft. She exhibits no distension.  No abdominal or CVA tenderness.  Genitourinary:  Genitourinary Comments: Chaperone present for exam.  No rashes, lesions, or tenderness to external genitalia. + discharge.  No CMT. No  adnexal masses, tenderness, or fullness.  No bleeding within vaginal vault.  Neurological: She is alert and oriented to person, place, and time.  Skin: Skin is warm and dry.  Nursing note and vitals reviewed.    ED Treatments / Results  Labs (all labs ordered are listed, but only abnormal results are displayed) Labs Reviewed  WET PREP, GENITAL - Abnormal; Notable for the following:       Result Value   Trich,  Wet Prep PRESENT (*)    Clue Cells Wet Prep HPF POC PRESENT (*)    WBC, Wet Prep HPF POC MANY (*)    All other components within normal limits  URINALYSIS, ROUTINE W REFLEX MICROSCOPIC - Abnormal; Notable for the following:    Specific Gravity, Urine 1.035 (*)    Hgb urine dipstick MODERATE (*)    Leukocytes, UA SMALL (*)    Squamous Epithelial / LPF 0-5 (*)    All other components within normal limits  URINE CULTURE  PREGNANCY, URINE  RPR  HIV ANTIBODY (ROUTINE TESTING)  GC/CHLAMYDIA PROBE AMP ([facility]) NOT AT [facility]    EKG  EKG Interpretation None       Radiology No results found.  Procedures Procedures (including  critical care time)  Medications Ordered in ED Medications  cefTRIAXone (ROCEPHIN) injection 250 mg (not administered)  azithromycin (ZITHROMAX) tablet 1,000 mg (not administered)  metroNIDAZOLE (FLAGYL) tablet 2,000 mg (not administered)  sterile water (preservative free) injection (not administered)     Initial Impression / Assessment and Plan / ED Course  I have reviewed the triage vital signs and the nursing notes.  Pertinent labs & imaging results that were available during my care of the patient were reviewed by me and considered in my medical decision making (see chart for details).    Tamara Bryant is a 26 y.o. female who presents to ED for dysuria 2 days associated with 1 day of vaginal discharge. Patient with benign abdominal exam, CVA tenderness, afebrile. Pelvic exam with vaginal discharge. No cervical motion or adnexal tenderness. UA with small leuks, 6-30 WBC's. Wet prep + for trich, clue cells and many WBC's. Wet prep results likely cause of symptoms today. Will treat with flagyl, rocephin, azithro. Urine sent for culture. Informed patient of results and plan. No sexual intercourse for 2 weeks and resolution of symptoms. Discussed importance of informing sexual partners about today's results. RPR, HIV, gonorrhea and chlamydia testing  obtained and patient aware that she will be notified if results are positive. Follow-up with OB/GYN or health department. All questions answered.  Final Clinical Impressions(s) / ED Diagnoses   Final diagnoses:  Trichomoniasis  Dysuria  Vaginal discharge    New Prescriptions New Prescriptions   No medications on file     [facility], PA-C 04/06/16 1610    Glynn Octave, MD 04/06/16 [phone]

## 2016-04-06 NOTE — ED Notes (Signed)
Pt wants to be called on mobile #[phone] with any test results

## 2016-04-06 NOTE — Discharge Instructions (Signed)
°  Use a condom with every sexual encounter Follow up with your doctor, OBGYN or the health department in regards to today's visit.   Please return to the ER for worsening symptoms, high fevers or persistent vomiting.  You have been tested for HIV, syphilis, chlamydia and gonorrhea. These results will be available in approximately 3 days. You will be notified if they are positive.   It is very important to practice safe sex and use condoms when sexually active. If your results are positive you need to notify all sexual partners so they can be treated as well. The website https://garcia.net/ can be used to send anonymous text messages or emails to alert sexual contacts.   SEEK IMMEDIATE MEDICAL CARE IF:  You develop an oral temperature above 102 F (38.9 C), not controlled by medications or lasting more than 2 days.  You develop an increase in pain.  You develop vaginal bleeding and it is not time for your period.  You develop painful intercourse.

## 2016-04-07 LAB — URINE CULTURE

## 2016-04-19 ENCOUNTER — Encounter (HOSPITAL_COMMUNITY): Payer: Self-pay

## 2016-04-19 ENCOUNTER — Emergency Department (HOSPITAL_COMMUNITY)
Admission: EM | Admit: 2016-04-19 | Discharge: 2016-04-19 | Disposition: A | Discharge status: Home / Self Care | Payer: Medicaid Other | Attending: Emergency Medicine | Admitting: Emergency Medicine

## 2016-04-19 DIAGNOSIS — H1013 Acute atopic conjunctivitis, bilateral: Secondary | ICD-10-CM | POA: Insufficient documentation

## 2016-04-19 DIAGNOSIS — J309 Allergic rhinitis, unspecified: Secondary | ICD-10-CM | POA: Insufficient documentation

## 2016-04-19 DIAGNOSIS — F17210 Nicotine dependence, cigarettes, uncomplicated: Secondary | ICD-10-CM | POA: Insufficient documentation

## 2016-04-19 MED ORDER — CETIRIZINE HCL 10 MG PO TABS: 10.0000 mg | ORAL_TABLET | Freq: Every day | ORAL | 0 refills | Status: DC

## 2016-04-19 MED ORDER — IPRATROPIUM BROMIDE 0.06 % NA SOLN: 2.0000 | Freq: Four times a day (QID) | NASAL | 12 refills | Status: DC

## 2016-04-19 MED ORDER — DIPHENHYDRAMINE HCL 25 MG PO CAPS
25.0000 mg | ORAL_CAPSULE | Freq: Once | ORAL | Status: AC
  Administered 2016-04-19: 25 mg via ORAL
  Filled 2016-04-19: qty 1

## 2016-04-19 NOTE — ED Provider Notes (Signed)
[facility] Provider Note   CSN: [id number] Arrival date & time: 04/19/16  1245  By signing my name below, I, Majel Homer, attest that this documentation has been prepared under the direction and in the presence of [employer], PA-C . Electronically Signed: Majel Homer, Scribe. 04/19/2016. 1:18 PM.  History   Chief Complaint Chief Complaint  Patient presents with  . Itchy Eye  . Nasal Congestion   The history is provided by the patient. No language interpreter was used.   HPI Comments: Tamara Bryant is a 26 y.o. female with PMHx of asthma, who presents to the Emergency Department complaining of gradually worsening, "burning and itchy" sensation to her bilateral eyes that began ~2 days ago and worsened this morning. Pt reports she awoke from sleep this morning with increased eye redness and sensation of facial itching. She states associated nasal congestion and postnasal drip. She notes she has not taken any medication to relieve her symptoms. Pt denies any bilateral eye drainage, shortness of breath, fever, and chills. Does not wear contacts or corrective lenses.  Past Medical History:  Diagnosis Date  . Asthma   . Obesity    There are no active problems to display for this patient.  Past Surgical History:  Procedure Laterality Date  . WISDOM TOOTH EXTRACTION      OB History    Gravida Para Term Preterm AB Living   0             SAB TAB Ectopic Multiple Live Births                 Home Medications    Prior to Admission medications   Medication Sig Start Date End Date Taking? Authorizing Provider  albuterol (PROVENTIL HFA;VENTOLIN HFA) 108 (90 Base) MCG/ACT inhaler Inhale 1-2 puffs into the lungs every 6 (six) hours as needed for wheezing or shortness of breath.    Historical Provider, MD  amoxicillin (AMOXIL) 500 MG capsule Take 1 capsule (500 mg total) by mouth 3 (three) times daily. Patient not taking: Reported on 04/06/2016 05/30/15   [name], MD    benzonatate (TESSALON) 100 MG capsule Take 2 capsules (200 mg total) by mouth 2 (two) times daily as needed for cough. Patient not taking: Reported on 04/06/2016 09/24/15   [name], PA-C  cetirizine (ZYRTEC) 10 MG tablet Take 1 tablet (10 mg total) by mouth daily. 04/19/16   Joycie Peek, PA-C  ipratropium (ATROVENT) 0.06 % nasal spray Place 2 sprays into both nostrils 4 (four) times daily. 04/19/16   Joycie Peek, PA-C  loperamide (IMODIUM) 2 MG capsule Take 1 capsule (2 mg total) by mouth 4 (four) times daily as needed for diarrhea or loose stools. Patient not taking: Reported on 04/06/2016 12/05/15   [name], MD  metroNIDAZOLE (FLAGYL) 500 MG tablet Take 1 tablet (500 mg total) by mouth 2 (two) times daily. One po bid x 7 days Patient not taking: Reported on 04/06/2016 05/30/15   [name], MD  ondansetron (ZOFRAN ODT) 4 MG disintegrating tablet Take 1 tablet (4 mg total) by mouth every 8 (eight) hours as needed for nausea or vomiting. Patient not taking: Reported on 04/06/2016 12/05/15   [name], MD  oxymetazoline (AFRIN NASAL SPRAY) 0.05 % nasal spray Place 1 spray into both nostrils 2 (two) times daily. Apply 1 spray to both nostrils twice daily for the next 3 days. Do not use for longer than 3 days to prevent rebound  rhinorrhea. Patient not taking: Reported on 04/06/2016 09/24/15   [name], PA-C  predniSONE (DELTASONE) 20 MG tablet Take 2 tablets (40 mg total) by mouth daily with breakfast. For the next four days Patient not taking: Reported on 04/06/2016 03/04/16   [name], MD  promethazine (PHENERGAN) 25 MG tablet Take 1 tablet (25 mg total) by mouth every 6 (six) hours as needed for nausea or vomiting. Patient not taking: Reported on 04/06/2016 09/25/15   [name], PA-C  traMADol (ULTRAM) 50 MG tablet Take 1 tablet (50 mg total) by mouth every 6 (six) hours as needed. Patient not taking: Reported on 04/06/2016 05/30/15    [name], MD    Family History Family History  Problem Relation Age of Onset  . Hypertension Mother   . Sickle cell anemia Mother   . Diabetes Father   . Hypertension Father   . Diabetes Maternal Grandmother   . Diabetes Paternal Grandfather     Social History Social History  Substance Use Topics  . Smoking status: Current Every Day Smoker    Packs/day: 1.00    Types: Cigarettes  . Smokeless tobacco: Never Used  . Alcohol use Yes     Comment: Occasional    Allergies   Patient has no known allergies.  Review of Systems Review of Systems  Constitutional: Negative for chills and fever.  HENT: Positive for congestion and postnasal drip.        +sensation of facial itching  Eyes: Positive for redness and itching. Negative for discharge.  Respiratory: Negative for shortness of breath.    Physical Exam Updated Vital Signs BP 120/88 (BP Location: Left Arm)   Pulse 92   Temp 99.1 F (37.3 C) (Oral)   Resp 18   LMP 03/15/2016 (Exact Date)   SpO2 98%   Physical Exam  Constitutional: She is oriented to person, place, and time. She appears well-developed and well-nourished.  HENT:  Head: Normocephalic.  Bilateral conjunctival injection, postnasal drip, and minimal erythematous posterior oropharynx   Eyes: EOM are normal.  Neck: Normal range of motion.  Cardiovascular: Normal rate and regular rhythm.   Pulmonary/Chest: Effort normal and breath sounds normal.  Abdominal: She exhibits no distension.  Musculoskeletal: Normal range of motion.  Neurological: She is alert and oriented to person, place, and time.  Psychiatric: She has a normal mood and affect.  Nursing note and vitals reviewed.  ED Treatments / Results  DIAGNOSTIC STUDIES:  Oxygen Saturation is 98% on RA, normal by my interpretation.    COORDINATION OF CARE:  1:15 PM Discussed treatment plan with pt at bedside and pt agreed to plan.  Labs (all labs ordered are listed, but only abnormal  results are displayed) Labs Reviewed - No data to display  EKG  EKG Interpretation None       Radiology No results found.  Procedures Procedures (including critical care time)  Medications Ordered in ED Medications - No data to display   Initial Impression / Assessment and Plan / ED Course  I have reviewed the triage vital signs and the nursing notes.  Pertinent labs & imaging results that were available during my care of the patient were reviewed by me and considered in my medical decision making (see chart for details).     Symptoms likely allergic in etiology. Rx Atrovent nasal spray, Zyrtec. Discussed continue supportive care at home. Follow-up with PCP. Return precautions discussed   I personally performed the services described in this documentation, which  was scribed in my presence. The recorded information has been reviewed and is accurate.   Final Clinical Impressions(s) / ED Diagnoses   Final diagnoses:  Allergic conjunctivitis of both eyes and rhinitis    New Prescriptions New Prescriptions   CETIRIZINE (ZYRTEC) 10 MG TABLET    Take 1 tablet (10 mg total) by mouth daily.   IPRATROPIUM (ATROVENT) 0.06 % NASAL SPRAY    Place 2 sprays into both nostrils 4 (four) times daily.     Joycie Peek, PA-C 04/19/16 1353    Canary Brim Tegeler, MD 04/19/16 1537

## 2016-04-19 NOTE — Discharge Instructions (Signed)
Take your medications as prescribed. Follow-up with your doctor as needed. Return to ED for any new or worsening symptoms as we discussed.

## 2016-04-19 NOTE — ED Triage Notes (Signed)
Pt states that she has itchy eyes as well as nasal congestion. She reports some facial itching as well. Eyes noted to be red.

## 2016-04-20 ENCOUNTER — Emergency Department (HOSPITAL_COMMUNITY): Payer: Self-pay

## 2016-04-20 ENCOUNTER — Emergency Department (HOSPITAL_COMMUNITY)
Admission: EM | Admit: 2016-04-20 | Discharge: 2016-04-20 | Disposition: A | Discharge status: Home / Self Care | Payer: Self-pay | Attending: Physician Assistant | Admitting: Physician Assistant

## 2016-04-20 ENCOUNTER — Encounter (HOSPITAL_COMMUNITY): Payer: Self-pay | Admitting: Emergency Medicine

## 2016-04-20 DIAGNOSIS — Z79899 Other long term (current) drug therapy: Secondary | ICD-10-CM | POA: Insufficient documentation

## 2016-04-20 DIAGNOSIS — J4520 Mild intermittent asthma, uncomplicated: Secondary | ICD-10-CM | POA: Insufficient documentation

## 2016-04-20 DIAGNOSIS — J301 Allergic rhinitis due to pollen: Secondary | ICD-10-CM

## 2016-04-20 DIAGNOSIS — F17210 Nicotine dependence, cigarettes, uncomplicated: Secondary | ICD-10-CM | POA: Insufficient documentation

## 2016-04-20 MED ORDER — ALBUTEROL SULFATE (2.5 MG/3ML) 0.083% IN NEBU
5.0000 mg | INHALATION_SOLUTION | Freq: Once | RESPIRATORY_TRACT | Status: AC
  Administered 2016-04-20: 5 mg via RESPIRATORY_TRACT
  Filled 2016-04-20: qty 6

## 2016-04-20 MED ORDER — ALBUTEROL SULFATE HFA 108 (90 BASE) MCG/ACT IN AERS
2.0000 | INHALATION_SPRAY | Freq: Once | RESPIRATORY_TRACT | Status: AC
  Administered 2016-04-20: 2 via RESPIRATORY_TRACT
  Filled 2016-04-20: qty 6.7

## 2016-04-20 NOTE — ED Notes (Signed)
Patient returned from X-ray 

## 2016-04-20 NOTE — ED Notes (Signed)
Patient brought back to room via wheelchair, states too short of breath to walk.  Patient laughing with boyfriend and speaking in full clear sentences.

## 2016-04-20 NOTE — ED Notes (Signed)
Patient in room cursing and hollering out.

## 2016-04-20 NOTE — Discharge Instructions (Signed)
I recommend continuing to use your inhaler as prescribed as needed for shortness of breath/wheezing. I recommend refrain from smoking. Continue taking your prescription of Zyrtec and using your nasal spray as needed for nasal congestion. Please follow up with a primary care provider from the Resource Guide provided below in 1 week as needed. Please return to the Emergency Department if symptoms worsen or new onset of fever, headache, facial swelling, coughing up blood, difficulty breathing, chest pain, heart palpitations, vomiting, abdominal pain, leg swelling.

## 2016-04-20 NOTE — ED Notes (Signed)
Patient transported to X-ray 

## 2016-04-20 NOTE — ED Triage Notes (Signed)
Pt sts URI sx with nasal congestion and asthma sx x 3 days

## 2016-04-20 NOTE — ED Provider Notes (Signed)
[facility]PT Provider Note    By signing my name below, I, Tamara Bryant, attest that this documentation has been prepared under the direction and in the presence of [name], PA-C. Electronically Signed: Earmon Bryant, ED Scribe. 04/20/16. 9:55 AM.    History   Chief Complaint Chief Complaint  Patient presents with  . URI   The history is provided by the patient and medical records. No language interpreter was used.    Tamara Bryant is an obese 26 y.o. female with PMHx of asthma who presents to the Emergency Department complaining of SOB/wheezing that began last night. She reports associated nasal congestion, productive cough of mucous and chest tightness. She denies any known sick contacts. She was seen here yesterday and prescribed Zyrtec for allergies which she has not started taking yet. She reports using a friend's MDI yesterday x1 for her symptoms with no significant relief. She denies modifying factors but states her asthma always gives her trouble around this time of year. She denies fever, chills, nausea, vomiting, HA, hemoptysis, palpitations, abdominal pain, vomiting, rash. She states she smokers 2-3 cigarettes daily.    Past Medical History:  Diagnosis Date  . Asthma   . Obesity     There are no active problems to display for this patient.   Past Surgical History:  Procedure Laterality Date  . WISDOM TOOTH EXTRACTION      OB History    Gravida Para Term Preterm AB Living   0             SAB TAB Ectopic Multiple Live Births                   Home Medications    Prior to Admission medications   Medication Sig Start Date End Date Taking? Authorizing Provider  albuterol (PROVENTIL HFA;VENTOLIN HFA) 108 (90 Base) MCG/ACT inhaler Inhale 1-2 puffs into the lungs every 6 (six) hours as needed for wheezing or shortness of breath.    Historical Provider, MD  amoxicillin (AMOXIL) 500 MG capsule Take 1 capsule (500 mg total) by mouth 3 (three)  times daily. Patient not taking: Reported on 04/06/2016 05/30/15   [name], MD  benzonatate (TESSALON) 100 MG capsule Take 2 capsules (200 mg total) by mouth 2 (two) times daily as needed for cough. Patient not taking: Reported on 04/06/2016 09/24/15   [name], PA-C  cetirizine (ZYRTEC) 10 MG tablet Take 1 tablet (10 mg total) by mouth daily. 04/19/16   Joycie Peek, PA-C  ipratropium (ATROVENT) 0.06 % nasal spray Place 2 sprays into both nostrils 4 (four) times daily. 04/19/16   Joycie Peek, PA-C  loperamide (IMODIUM) 2 MG capsule Take 1 capsule (2 mg total) by mouth 4 (four) times daily as needed for diarrhea or loose stools. Patient not taking: Reported on 04/06/2016 12/05/15   [name], MD  metroNIDAZOLE (FLAGYL) 500 MG tablet Take 1 tablet (500 mg total) by mouth 2 (two) times daily. One po bid x 7 days Patient not taking: Reported on 04/06/2016 05/30/15   [name], MD  ondansetron (ZOFRAN ODT) 4 MG disintegrating tablet Take 1 tablet (4 mg total) by mouth every 8 (eight) hours as needed for nausea or vomiting. Patient not taking: Reported on 04/06/2016 12/05/15   [name], MD  oxymetazoline (AFRIN NASAL SPRAY) 0.05 % nasal spray Place 1 spray into both nostrils 2 (two) times daily. Apply 1 spray to both nostrils twice daily for the next  3 days. Do not use for longer than 3 days to prevent rebound rhinorrhea. Patient not taking: Reported on 04/06/2016 09/24/15   [name], PA-C  predniSONE (DELTASONE) 20 MG tablet Take 2 tablets (40 mg total) by mouth daily with breakfast. For the next four days Patient not taking: Reported on 04/06/2016 03/04/16   [name], MD  promethazine (PHENERGAN) 25 MG tablet Take 1 tablet (25 mg total) by mouth every 6 (six) hours as needed for nausea or vomiting. Patient not taking: Reported on 04/06/2016 09/25/15   [name], PA-C  traMADol (ULTRAM) 50 MG tablet Take 1 tablet (50 mg total) by  mouth every 6 (six) hours as needed. Patient not taking: Reported on 04/06/2016 05/30/15   [name], MD    Family History Family History  Problem Relation Age of Onset  . Hypertension Mother   . Sickle cell anemia Mother   . Diabetes Father   . Hypertension Father   . Diabetes Maternal Grandmother   . Diabetes Paternal Grandfather     Social History Social History  Substance Use Topics  . Smoking status: Current Every Day Smoker    Packs/day: 1.00    Types: Cigarettes  . Smokeless tobacco: Never Used  . Alcohol use Yes     Comment: Occasional      Allergies   Patient has no known allergies.   Review of Systems Review of Systems  Constitutional: Negative for chills and fever.  HENT: Positive for congestion.   Eyes: Positive for discharge and itching.  Respiratory: Positive for cough, chest tightness and shortness of breath.   Gastrointestinal: Negative for nausea and vomiting.  Neurological: Negative for headaches.     Physical Exam Updated Vital Signs BP 107/88 (BP Location: Left Arm)   Pulse (!) 104   Temp 98.4 F (36.9 C) (Oral)   Resp 20   SpO2 100%   Physical Exam  Constitutional: She is oriented to person, place, and time. She appears well-developed and well-nourished.  HENT:  Head: Normocephalic and atraumatic.  Right Ear: Tympanic membrane and ear canal normal.  Left Ear: Tympanic membrane and ear canal normal.  Nose: Rhinorrhea present. Right sinus exhibits no maxillary sinus tenderness and no frontal sinus tenderness. Left sinus exhibits no maxillary sinus tenderness and no frontal sinus tenderness.  Mouth/Throat: Uvula is midline and mucous membranes are normal. No oropharyngeal exudate, posterior oropharyngeal edema, posterior oropharyngeal erythema or tonsillar abscesses. No tonsillar exudate.  Nasal congestion present with post nasal drip.  Eyes: Conjunctivae and EOM are normal. Right eye exhibits no discharge. Left eye exhibits no  discharge. No scleral icterus.  Neck: Normal range of motion. Neck supple.  Cardiovascular: Normal rate, regular rhythm, normal heart sounds and intact distal pulses.   Pulmonary/Chest: Effort normal. No respiratory distress. She has wheezes (faint, intermittent and expiratory throughout). She has no rales. She exhibits no tenderness.  Abdominal: Soft. Bowel sounds are normal. There is no tenderness.  Musculoskeletal: She exhibits no edema.  Lymphadenopathy:    She has no cervical adenopathy.  Neurological: She is alert and oriented to person, place, and time.  Skin: Skin is warm and dry.  Nursing note and vitals reviewed.    ED Treatments / Results  DIAGNOSTIC STUDIES: Oxygen Saturation is 100% on RA, normal by my interpretation.   COORDINATION OF CARE: 9:17 AM- Will order albuterol MDI. Will order nebullizer treatment and CXR. Pt verbalizes understanding and agrees to plan.  Medications  albuterol (PROVENTIL HFA;VENTOLIN HFA) 108 (  90 Base) MCG/ACT inhaler 2 puff (2 puffs Inhalation Given 04/20/16 0924)  albuterol (PROVENTIL) (2.5 MG/3ML) 0.083% nebulizer solution 5 mg (5 mg Nebulization Given 04/20/16 0924)    Labs (all labs ordered are listed, but only abnormal results are displayed) Labs Reviewed - No data to display  EKG  EKG Interpretation None       Radiology Dg Chest 2 View  Result Date: 04/20/2016 CLINICAL DATA:  Shortness of breath, cough, congestion for 2 days EXAM: CHEST  2 VIEW COMPARISON:  09/25/2015 FINDINGS: Cardiomediastinal silhouette is stable. No infiltrate or pleural effusion. No pulmonary edema. Bony thorax is unremarkable. IMPRESSION: No active cardiopulmonary disease. Electronically Signed   By: Natasha Mead M.D.   On: 04/20/2016 09:49    Procedures Procedures (including critical care time)  Medications Ordered in ED Medications  albuterol (PROVENTIL HFA;VENTOLIN HFA) 108 (90 Base) MCG/ACT inhaler 2 puff (2 puffs Inhalation Given 04/20/16 0924)    albuterol (PROVENTIL) (2.5 MG/3ML) 0.083% nebulizer solution 5 mg (5 mg Nebulization Given 04/20/16 0924)     Initial Impression / Assessment and Plan / ED Course  I have reviewed the triage vital signs and the nursing notes.  Pertinent labs & imaging results that were available during my care of the patient were reviewed by me and considered in my medical decision making (see chart for details).     Pt presents with asthma exacerbation. Also reports having continued sxs of nasal congestion and itchy/watery eyes which she was seen in the ED for yesterday and given rx of zyrtec which she has not taken yet. Denies fever. VSS. Exam showed mild faint intermittent expiratory wheezing, no signs of respiratory distress. Pt given albuterol neb. CXR negative. On reevaluation, pt laying resting comfortably in bed. Lungs CTAB. Pt's sxs consistent with mild asthma exacerbation. Plan to d/c pt home with albuterol inhaler and advised pt to fill rx of zyrtec and flonase for seasonal allergies. Pt given info to follow up with PCP. Discussed return precautions.   Final Clinical Impressions(s) / ED Diagnoses   Final diagnoses:  Mild intermittent asthma without complication  Seasonal allergic rhinitis due to pollen    New Prescriptions New Prescriptions   No medications on file    I personally performed the services described in this documentation, which was scribed in my presence. The recorded information has been reviewed and is accurate.     Satira Sark [place], [place] 04/20/16 1028    Courteney Randall An, MD 04/20/16 1458

## 2016-04-21 ENCOUNTER — Encounter (HOSPITAL_COMMUNITY): Payer: Self-pay | Admitting: Emergency Medicine

## 2016-04-21 ENCOUNTER — Emergency Department (HOSPITAL_COMMUNITY)
Admission: EM | Admit: 2016-04-21 | Discharge: 2016-04-21 | Disposition: A | Discharge status: Home / Self Care | Payer: Medicaid Other | Attending: Emergency Medicine | Admitting: Emergency Medicine

## 2016-04-21 DIAGNOSIS — F17210 Nicotine dependence, cigarettes, uncomplicated: Secondary | ICD-10-CM | POA: Insufficient documentation

## 2016-04-21 DIAGNOSIS — J4531 Mild persistent asthma with (acute) exacerbation: Secondary | ICD-10-CM

## 2016-04-21 DIAGNOSIS — Z79899 Other long term (current) drug therapy: Secondary | ICD-10-CM | POA: Insufficient documentation

## 2016-04-21 LAB — D-DIMER, QUANTITATIVE: D-Dimer, Quant: 0.27 ug/mL-FEU (ref 0.00–0.50)

## 2016-04-21 LAB — I-STAT CHEM 8, ED
BUN: 15 mg/dL (ref 6–20)
CHLORIDE: 104 mmol/L (ref 101–111)
Calcium, Ion: 1.14 mmol/L — ABNORMAL LOW (ref 1.15–1.40)
Creatinine, Ser: 0.8 mg/dL (ref 0.44–1.00)
Glucose, Bld: 99 mg/dL (ref 65–99)
HEMATOCRIT: 38 % (ref 36.0–46.0)
Hemoglobin: 12.9 g/dL (ref 12.0–15.0)
POTASSIUM: 3.3 mmol/L — AB (ref 3.5–5.1)
SODIUM: 141 mmol/L (ref 135–145)
TCO2: 26 mmol/L (ref 0–100)

## 2016-04-21 MED ORDER — IPRATROPIUM BROMIDE 0.06 % NA SOLN
2.0000 | Freq: Four times a day (QID) | NASAL | Status: DC
  Administered 2016-04-21: 2 via NASAL
  Filled 2016-04-21: qty 15

## 2016-04-21 MED ORDER — ALBUTEROL SULFATE (2.5 MG/3ML) 0.083% IN NEBU
5.0000 mg | INHALATION_SOLUTION | Freq: Once | RESPIRATORY_TRACT | Status: AC
  Administered 2016-04-21: 5 mg via RESPIRATORY_TRACT

## 2016-04-21 MED ORDER — DEXAMETHASONE SODIUM PHOSPHATE 10 MG/ML IJ SOLN
10.0000 mg | Freq: Once | INTRAMUSCULAR | Status: AC
  Administered 2016-04-21: 10 mg via INTRAMUSCULAR
  Filled 2016-04-21: qty 1

## 2016-04-21 MED ORDER — ALBUTEROL SULFATE (2.5 MG/3ML) 0.083% IN NEBU
INHALATION_SOLUTION | RESPIRATORY_TRACT | Status: AC
  Filled 2016-04-21: qty 6

## 2016-04-21 NOTE — ED Triage Notes (Signed)
Pt. reports asthma attack onset yesterday with wheezing and productive cough , denies fever or chills .

## 2016-04-21 NOTE — ED Provider Notes (Signed)
[facility] Provider Note   CSN: [id number] Arrival date & time: 04/21/16  0124     History   Chief Complaint Chief Complaint  Patient presents with  . Asthma    HPI MARCHEL FOOTE is a 26 y.o. female.   26 year old female with a history of asthma and obesity presents to the emergency department for worsening shortness of breath. She states that she has continued to experience wheezing intermittently throughout the day consistent with past asthma exacerbations. She has been using her albuterol inhaler without significant relief. This is the patient's third visit to the emergency department in the last 3 days for similar symptoms. She states that she has not been able to get her prescriptions filled because she cannot afford them. She notes a productive cough without hemoptysis as well as nasal congestion. No fever or chills, leg swelling, use of birth control, recent surgeries or hospitalizations. No vomiting or diarrhea. She feels as though her symptoms have improved since receiving a nebulizer treatment in triage.     Past Medical History:  Diagnosis Date  . Asthma   . Obesity     There are no active problems to display for this patient.   Past Surgical History:  Procedure Laterality Date  . WISDOM TOOTH EXTRACTION      OB History    Gravida Para Term Preterm AB Living   0             SAB TAB Ectopic Multiple Live Births                   Home Medications    Prior to Admission medications   Medication Sig Start Date End Date Taking? Authorizing Provider  albuterol (PROVENTIL HFA;VENTOLIN HFA) 108 (90 Base) MCG/ACT inhaler Inhale 1-2 puffs into the lungs every 6 (six) hours as needed for wheezing or shortness of breath.    Historical Provider, MD  amoxicillin (AMOXIL) 500 MG capsule Take 1 capsule (500 mg total) by mouth 3 (three) times daily. Patient not taking: Reported on 04/06/2016 05/30/15   [name], MD  benzonatate (TESSALON) 100 MG  capsule Take 2 capsules (200 mg total) by mouth 2 (two) times daily as needed for cough. Patient not taking: Reported on 04/06/2016 09/24/15   [name], PA-C  cetirizine (ZYRTEC) 10 MG tablet Take 1 tablet (10 mg total) by mouth daily. 04/19/16   Joycie Peek, PA-C  ipratropium (ATROVENT) 0.06 % nasal spray Place 2 sprays into both nostrils 4 (four) times daily. 04/19/16   Joycie Peek, PA-C  loperamide (IMODIUM) 2 MG capsule Take 1 capsule (2 mg total) by mouth 4 (four) times daily as needed for diarrhea or loose stools. Patient not taking: Reported on 04/06/2016 12/05/15   [name], MD  metroNIDAZOLE (FLAGYL) 500 MG tablet Take 1 tablet (500 mg total) by mouth 2 (two) times daily. One po bid x 7 days Patient not taking: Reported on 04/06/2016 05/30/15   [name], MD  ondansetron (ZOFRAN ODT) 4 MG disintegrating tablet Take 1 tablet (4 mg total) by mouth every 8 (eight) hours as needed for nausea or vomiting. Patient not taking: Reported on 04/06/2016 12/05/15   [name], MD  oxymetazoline (AFRIN NASAL SPRAY) 0.05 % nasal spray Place 1 spray into both nostrils 2 (two) times daily. Apply 1 spray to both nostrils twice daily for the next 3 days. Do not use for longer than 3 days to prevent rebound rhinorrhea. Patient not  taking: Reported on 04/06/2016 09/24/15   [name], PA-C  predniSONE (DELTASONE) 20 MG tablet Take 2 tablets (40 mg total) by mouth daily with breakfast. For the next four days Patient not taking: Reported on 04/06/2016 03/04/16   [name], MD  promethazine (PHENERGAN) 25 MG tablet Take 1 tablet (25 mg total) by mouth every 6 (six) hours as needed for nausea or vomiting. Patient not taking: Reported on 04/06/2016 09/25/15   [name], PA-C  traMADol (ULTRAM) 50 MG tablet Take 1 tablet (50 mg total) by mouth every 6 (six) hours as needed. Patient not taking: Reported on 04/06/2016 05/30/15   [name], MD    Family  History Family History  Problem Relation Age of Onset  . Hypertension Mother   . Sickle cell anemia Mother   . Diabetes Father   . Hypertension Father   . Diabetes Maternal Grandmother   . Diabetes Paternal Grandfather     Social History Social History  Substance Use Topics  . Smoking status: Current Every Day Smoker    Packs/day: 1.00    Types: Cigarettes  . Smokeless tobacco: Never Used  . Alcohol use Yes     Comment: Occasional      Allergies   Patient has no known allergies.   Review of Systems Review of Systems Ten systems reviewed and are negative for acute change, except as noted in the HPI.    Physical Exam Updated Vital Signs BP 119/79   Pulse 100   Temp 98.9 F (37.2 C) (Oral)   Resp 18   SpO2 99%   Physical Exam  Constitutional: She is oriented to person, place, and time. She appears well-developed and well-nourished. No distress.  Nontoxic and in NAD  HENT:  Head: Normocephalic and atraumatic.  Eyes: Conjunctivae and EOM are normal. No scleral icterus.  Neck: Normal range of motion.  Cardiovascular: Normal rate, regular rhythm and intact distal pulses.   Pulmonary/Chest: Effort normal. No respiratory distress. She has no wheezes. She has no rales.  Lungs CTAB  Musculoskeletal: Normal range of motion.  Neurological: She is alert and oriented to person, place, and time. She exhibits normal muscle tone. Coordination normal.  Skin: Skin is warm and dry. No rash noted. She is not diaphoretic. No erythema. No pallor.  Psychiatric: She has a normal mood and affect. Her behavior is normal.  Nursing note and vitals reviewed.    ED Treatments / Results  Labs (all labs ordered are listed, but only abnormal results are displayed) Labs Reviewed  I-STAT CHEM 8, ED - Abnormal; Notable for the following:       Result Value   Potassium 3.3 (*)    Calcium, Ion 1.14 (*)    All other components within normal limits  D-DIMER, QUANTITATIVE (NOT AT [facility])     EKG  EKG Interpretation None       Radiology Dg Chest 2 View  Result Date: 04/20/2016 CLINICAL DATA:  Shortness of breath, cough, congestion for 2 days EXAM: CHEST  2 VIEW COMPARISON:  09/25/2015 FINDINGS: Cardiomediastinal silhouette is stable. No infiltrate or pleural effusion. No pulmonary edema. Bony thorax is unremarkable. IMPRESSION: No active cardiopulmonary disease. Electronically Signed   By: Natasha Mead M.D.   On: 04/20/2016 09:49    Procedures Procedures (including critical care time)  Medications Ordered in ED Medications  ipratropium (ATROVENT) 0.06 % nasal spray 2 spray (2 sprays Each Nare Given 04/21/16 0446)  albuterol (PROVENTIL) (2.5 MG/3ML) 0.083% nebulizer solution  5 mg (5 mg Nebulization Given 04/21/16 0141)  dexamethasone (DECADRON) injection 10 mg (10 mg Intramuscular Given 04/21/16 0445)     Initial Impression / Assessment and Plan / ED Course  I have reviewed the triage vital signs and the nursing notes.  Pertinent labs & imaging results that were available during my care of the patient were reviewed by me and considered in my medical decision making (see chart for details).     26 year old female presents to the emergency department for evaluation of worsening shortness of breath and wheezing consistent with prior asthma exacerbations. This is the patient's third visit in the last 72 hours. She reports inability to get her prescriptions filled secondary to cost.  Patient received a breathing treatment in triage. She was noted to have clear lung sounds on my exam. Patient able to ambulate in the emergency department without hypoxia. She feels better following this treatment.  Patient given Decadron in the emergency department to help assist with breathing. She was also prescribed Atrovent nasal spray previously. This was provided to her while in the ED. Patient advised to continue with supportive management. I do not believe further emergent workup is  indicated. Patient discharged in stable condition with no unaddressed concerns.   Final Clinical Impressions(s) / ED Diagnoses   Final diagnoses:  Mild persistent asthma with exacerbation    New Prescriptions New Prescriptions   No medications on file     Antony Madura, PA-C 04/21/16 1610    Jacalyn Lefevre, MD 04/21/16 [phone]

## 2016-04-21 NOTE — Discharge Instructions (Signed)
Used 2 sprays of Atrovent nasal spray in each nostril every 6 hours as needed for persistent congestion. Use 2 puffs of an albuterol inhaler every 6 hours for shortness of breath. You may use over-the-counter remedies for cough. We also advise the daily use of Zyrtec or Claritin. Follow-up with a primary care doctor.

## 2016-04-21 NOTE — ED Notes (Signed)
Patient ambulated in hallway.  Patients 02 saturation remained at 98-100%.  Patient did not have any complaints of dizziness.

## 2016-04-30 ENCOUNTER — Emergency Department (HOSPITAL_COMMUNITY)
Admission: EM | Admit: 2016-04-30 | Discharge: 2016-04-30 | Disposition: A | Discharge status: Home / Self Care | Payer: Self-pay | Attending: Emergency Medicine | Admitting: Emergency Medicine

## 2016-04-30 ENCOUNTER — Emergency Department (HOSPITAL_COMMUNITY): Payer: Self-pay

## 2016-04-30 ENCOUNTER — Encounter (HOSPITAL_COMMUNITY): Payer: Self-pay

## 2016-04-30 DIAGNOSIS — J4521 Mild intermittent asthma with (acute) exacerbation: Secondary | ICD-10-CM | POA: Insufficient documentation

## 2016-04-30 DIAGNOSIS — R0789 Other chest pain: Secondary | ICD-10-CM

## 2016-04-30 DIAGNOSIS — F17210 Nicotine dependence, cigarettes, uncomplicated: Secondary | ICD-10-CM | POA: Insufficient documentation

## 2016-04-30 DIAGNOSIS — Z79899 Other long term (current) drug therapy: Secondary | ICD-10-CM | POA: Insufficient documentation

## 2016-04-30 LAB — BASIC METABOLIC PANEL
ANION GAP: 8 (ref 5–15)
BUN: 15 mg/dL (ref 6–20)
CALCIUM: 8.9 mg/dL (ref 8.9–10.3)
CO2: 22 mmol/L (ref 22–32)
CREATININE: 0.83 mg/dL (ref 0.44–1.00)
Chloride: 108 mmol/L (ref 101–111)
Glucose, Bld: 112 mg/dL — ABNORMAL HIGH (ref 65–99)
Potassium: 3.6 mmol/L (ref 3.5–5.1)
SODIUM: 138 mmol/L (ref 135–145)

## 2016-04-30 LAB — CBC
HEMATOCRIT: 39.9 % (ref 36.0–46.0)
Hemoglobin: 13.3 g/dL (ref 12.0–15.0)
MCH: 27.1 pg (ref 26.0–34.0)
MCHC: 33.3 g/dL (ref 30.0–36.0)
MCV: 81.3 fL (ref 78.0–100.0)
PLATELETS: 310 10*3/uL (ref 150–400)
RBC: 4.91 MIL/uL (ref 3.87–5.11)
RDW: 15.4 % (ref 11.5–15.5)
WBC: 12.5 10*3/uL — AB (ref 4.0–10.5)

## 2016-04-30 LAB — I-STAT BETA HCG BLOOD, ED (MC, WL, AP ONLY): I-stat hCG, quantitative: <5 m[IU]/mL (ref <5)

## 2016-04-30 MED ORDER — METHYLPREDNISOLONE SODIUM SUCC 125 MG IJ SOLR
80.0000 mg | Freq: Once | INTRAMUSCULAR | Status: AC
  Administered 2016-04-30: 80 mg via INTRAVENOUS
  Filled 2016-04-30: qty 2

## 2016-04-30 MED ORDER — KETOROLAC TROMETHAMINE 10 MG PO TABS: 10.0000 mg | ORAL_TABLET | Freq: Four times a day (QID) | ORAL | 0 refills | Status: DC | PRN

## 2016-04-30 MED ORDER — IPRATROPIUM-ALBUTEROL 0.5-2.5 (3) MG/3ML IN SOLN
3.0000 mL | Freq: Once | RESPIRATORY_TRACT | Status: AC
  Administered 2016-04-30: 3 mL via RESPIRATORY_TRACT
  Filled 2016-04-30: qty 3

## 2016-04-30 MED ORDER — IOPAMIDOL (ISOVUE-370) INJECTION 76%
INTRAVENOUS | Status: AC
  Administered 2016-04-30: 100 mL
  Filled 2016-04-30: qty 100

## 2016-04-30 MED ORDER — LORATADINE 10 MG PO TABS: 10.0000 mg | ORAL_TABLET | Freq: Every day | ORAL | 0 refills | Status: DC

## 2016-04-30 MED ORDER — AEROCHAMBER PLUS W/MASK MISC
1.0000 | Freq: Once | Status: AC
  Administered 2016-04-30: 1
  Filled 2016-04-30 (×2): qty 1

## 2016-04-30 MED ORDER — KETOROLAC TROMETHAMINE 15 MG/ML IJ SOLN
15.0000 mg | Freq: Once | INTRAMUSCULAR | Status: AC
  Administered 2016-04-30: 15 mg via INTRAVENOUS
  Filled 2016-04-30: qty 1

## 2016-04-30 MED ORDER — PREDNISONE 10 MG (21) PO TBPK: ORAL_TABLET | ORAL | 0 refills | Status: DC

## 2016-04-30 NOTE — ED Notes (Signed)
Pt. Want sa full work up , pt being moved per Dr. Particia Nearing

## 2016-04-30 NOTE — ED Provider Notes (Signed)
[facility]-EM[facility]Y DEPT Provider Note   CSN: [id number] Arrival date & time: 04/30/16  7829  By signing my name below, I, Tamara Bryant, attest that this documentation has been prepared under the direction and in the presence of Mathews Robinsons, PA-C. Electronically Signed: Linna Bryant, Scribe. 04/30/2016. 9:22 AM.  History   Chief Complaint Chief Complaint  Patient presents with  . Shortness of Breath    The history is provided by the patient. No language interpreter was used.     HPI Comments: Tamara Bryant is a 26 y.o. female with PMHx including asthma who presents to the Emergency Department complaining of constant pleuritic chest pain beginning yesterday. She states when she breathes she experiences chest pain that shoots straight into her back, and notes the pain is worse with deep inhalation. Pt notes she has experienced similar chest pain in the past but never for as long in duration. Patient also reports an intermittent non-productive cough, shortness of breath, nausea, and postnasal drip for the last several days. She has used her albuterol inhaler with no improvement of her respiratory symptoms. No other medications or treatments tried. No recent trauma to her chest. Per chart review, she was evaluated here on 04/21/16 for an asthma exacerbation and notes she did not have any chest pain during this visit. Patient denies wheezing, fevers, chills, vomiting, or any other associated symptoms. Hx of DVT/PE, estrogen use, recent surgeries or prolonged immobilization, calf pain or swelling, malignancy or hemoptysis.  Past Medical History:  Diagnosis Date  . Asthma   . Obesity     There are no active problems to display for this patient.   Past Surgical History:  Procedure Laterality Date  . WISDOM TOOTH EXTRACTION      OB History    Gravida Para Term Preterm AB Living   0             SAB TAB Ectopic Multiple Live Births                   Home Medications    Prior to  Admission medications   Medication Sig Start Date End Date Taking? Authorizing Provider  albuterol (PROVENTIL HFA;VENTOLIN HFA) 108 (90 Base) MCG/ACT inhaler Inhale 1-2 puffs into the lungs every 6 (six) hours as needed for wheezing or shortness of breath.   Yes Historical Provider, MD  ibuprofen (ADVIL,MOTRIN) 200 MG tablet Take 800 mg by mouth every 6 (six) hours as needed for mild pain.   Yes Historical Provider, MD  cetirizine (ZYRTEC) 10 MG tablet Take 1 tablet (10 mg total) by mouth daily. Patient not taking: Reported on 04/30/2016 04/19/16   Joycie Peek, PA-C  ipratropium (ATROVENT) 0.06 % nasal spray Place 2 sprays into both nostrils 4 (four) times daily. Patient not taking: Reported on 04/30/2016 04/19/16   Joycie Peek, PA-C  ketorolac (TORADOL) 10 MG tablet Take 1 tablet (10 mg total) by mouth every 6 (six) hours as needed. 04/30/16   Jacalyn Lefevre, MD  loperamide (IMODIUM) 2 MG capsule Take 1 capsule (2 mg total) by mouth 4 (four) times daily as needed for diarrhea or loose stools. Patient not taking: Reported on 04/06/2016 12/05/15   [name], MD  loratadine (CLARITIN) 10 MG tablet Take 1 tablet (10 mg total) by mouth daily. 04/30/16   Jacalyn Lefevre, MD  oxymetazoline [facility] NASAL SPRAY) 0.05 % nasal spray Place 1 spray into both nostrils 2 (two) times daily. Apply 1 spray to both nostrils twice daily for  the next 3 days. Do not use for longer than 3 days to prevent rebound rhinorrhea. Patient not taking: Reported on 04/06/2016 09/24/15   [name], PA-C  predniSONE (STERAPRED UNI-PAK 21 TAB) 10 MG (21) TBPK tablet Take 6 tabs by mouth daily  for 2 days, then 5 tabs for 2 days, then 4 tabs for 2 days, then 3 tabs for 2 days, 2 tabs for 2 days, then 1 tab by mouth daily for 2 days 04/30/16   Jacalyn Lefevre, MD  promethazine (PHENERGAN) 25 MG tablet Take 1 tablet (25 mg total) by mouth every 6 (six) hours as needed for nausea or vomiting. Patient not taking: Reported  on 04/06/2016 09/25/15   [name], PA-C  traMADol (ULTRAM) 50 MG tablet Take 1 tablet (50 mg total) by mouth every 6 (six) hours as needed. Patient not taking: Reported on 04/06/2016 05/30/15   [name], MD    Family History Family History  Problem Relation Age of Onset  . Hypertension Mother   . Sickle cell anemia Mother   . Diabetes Father   . Hypertension Father   . Diabetes Maternal Grandmother   . Diabetes Paternal Grandfather     Social History Social History  Substance Use Topics  . Smoking status: Current Every Day Smoker    Packs/day: 1.00    Types: Cigarettes  . Smokeless tobacco: Never Used  . Alcohol use Yes     Comment: Occasional      Allergies   Patient has no known allergies.   Review of Systems Review of Systems  Constitutional: Negative for chills and fever.  HENT: Positive for postnasal drip.   Respiratory: Positive for cough and shortness of breath. Negative for wheezing.   Cardiovascular: Positive for chest pain.  Gastrointestinal: Positive for nausea. Negative for vomiting.   Physical Exam Updated Vital Signs BP 116/78   Pulse 80   Temp 98 F (36.7 C) (Oral)   Resp 15   SpO2 98%   Physical Exam  Constitutional: She is oriented to person, place, and time. She appears well-developed and well-nourished. No distress.  Patient is afebrile, non-toxic appearing, sitting comfortably in chair in no acute distress.  HENT:  Head: Normocephalic and atraumatic.  Eyes: Conjunctivae and EOM are normal.  Neck: Neck supple. No tracheal deviation present.  Cardiovascular: Normal rate, regular rhythm, normal heart sounds and intact distal pulses.   Pulmonary/Chest: Effort normal and breath sounds normal. No respiratory distress. She has no wheezes. She has no rales. She exhibits tenderness.  Musculoskeletal: Normal range of motion.  Neurological: She is alert and oriented to person, place, and time.  Skin: Skin is warm and dry.    Psychiatric: She has a normal mood and affect. Her behavior is normal.  Nursing note and vitals reviewed.  ED Treatments / Results  Labs (all labs ordered are listed, but only abnormal results are displayed) Labs Reviewed  CBC - Abnormal; Notable for the following:       Result Value   WBC 12.5 (*)    All other components within normal limits  BASIC METABOLIC PANEL - Abnormal; Notable for the following:    Glucose, Bld 112 (*)    All other components within normal limits  I-STAT BETA HCG BLOOD, ED ([facility], [facility], [facility] ONLY)  I-STAT BETA HCG BLOOD, ED ([facility], [facility], [facility] ONLY)    EKG  EKG Interpretation None       Radiology Ct Angio Chest Pe W And/or Wo Contrast  Result Date: 04/30/2016 CLINICAL DATA:  Asthma.  Chest pain beginning this morning. EXAM: CT ANGIOGRAPHY CHEST WITH CONTRAST TECHNIQUE: Multidetector CT imaging of the chest was performed using the standard protocol during bolus administration of intravenous contrast. Multiplanar CT image reconstructions and MIPs were obtained to evaluate the vascular anatomy. CONTRAST:  100 cc Isovue 370 COMPARISON:  Chest radiography same day FINDINGS: Cardiovascular: Pulmonary arterial opacification is low to moderate. No pulmonary emboli are seen. Small emboli could be inapparent. The aorta appears normal. No coronary calcification. Mediastinum/Nodes: No mass or lymphadenopathy. Lungs/Pleura: Scattered areas of air trapping. No consolidation, collapse or effusion. Upper Abdomen: Negative Musculoskeletal: Normal Review of the MIP images confirms the above findings. IMPRESSION: Pulmonary arterial opacification is only low to moderate. No pulmonary emboli are seen. Small emboli/distal emboli can't be excluded, but there is no positive evidence to suggest that. Areas of pulmonary air trapping consistent with the clinical history of asthma. No consolidation or collapse. Electronically Signed   By: Paulina Fusi M.D.   On: 04/30/2016 13:34     Procedures Procedures (including critical care time)  DIAGNOSTIC STUDIES: Oxygen Saturation is 100% on RA, normal by my interpretation.    Medications Ordered in ED Medications  methylPREDNISolone sodium succinate (SOLU-MEDROL) 125 mg/2 mL injection 80 mg (80 mg Intravenous Given 04/30/16 1055)  aerochamber plus with mask device 1 each (1 each Other Given 04/30/16 1402)  ketorolac (TORADOL) 15 MG/ML injection 15 mg (15 mg Intravenous Given 04/30/16 1052)  ipratropium-albuterol (DUONEB) 0.5-2.5 (3) MG/3ML nebulizer solution 3 mL (3 mLs Nebulization Given 04/30/16 1114)  iopamidol (ISOVUE-370) 76 % injection (100 mLs  Contrast Given 04/30/16 1254)     Initial Impression / Assessment and Plan / ED Course  I have reviewed the triage vital signs and the nursing notes.  Pertinent labs & imaging results that were available during my care of the patient were reviewed by me and considered in my medical decision making (see chart for details).     Patient was not receptive to questioning about her symptoms. She was seen multiple times for similar complaints, chest xray and dimer negative 10 days ago. She stated that she just wants to know why she's hurting and for the pain to go away. Patient has been coughing and smokes ~ 3 cigarettes /day.  Exam was reassuring, lungs are CTAB, no wheezing or rales. She is afebrile, non-toxic appearing and speaking in full sentences. No tachycardia or hypoxia, stable vitals. Tenderness to palpation of left lower intercostals.  Discussed with patient potential explanations for her pain including intercostal muscle strain, pneumonia, PE and offered to repeat chest xray to ensure no new pneumonia. After agreeing with plan, she walked out and no longer wanted a CXR as she did not think she had pneumonia and it would be useless and I would send her home with ibuprofen which doesn't help.  Her accompanying boy friend was also talking at the same time making it  difficult to understand. Patient constantly interrupting, was not receptive to any suggestions provided. I asked what she was concerned it could be, offered PE and cardiac workup and attempted to explain my clinical reasoning based on her hx and risk factors, but could not get a clear answer from patient. Explained risks with repeated exposure to radiation and both were against CT, stating that they had looked for a clot last time here and she didn't have one.   Patient was then discussed with Dr. Particia Nearing and while speaking with her, the  nurse came in reporting that the patient was beligerent and requesting a Doctor to come see her while swearing at her.  Dr Particia Nearing saw her and agrees with assessment and plan, recommends CT as patient now stating that she was concerned she had a clot. Patient has very low risk for PE or cardiac origin of chest pain.   Patient was moved to higher acuity side for CT and further evaluation. Patient will be followed by Dr. Particia Nearing in different POD for the rest of her work up and disposition.  Final Clinical Impressions(s) / ED Diagnoses   Final diagnoses:  Mild intermittent asthma with exacerbation  Chest wall pain    New Prescriptions Discharge Medication List as of 04/30/2016  1:51 PM    START taking these medications   Details  ketorolac (TORADOL) 10 MG tablet Take 1 tablet (10 mg total) by mouth every 6 (six) hours as needed., Starting Sun 04/30/2016, Print    loratadine (CLARITIN) 10 MG tablet Take 1 tablet (10 mg total) by mouth daily., Starting Sun 04/30/2016, Print    predniSONE (STERAPRED UNI-PAK 21 TAB) 10 MG (21) TBPK tablet Take 6 tabs by mouth daily  for 2 days, then 5 tabs for 2 days, then 4 tabs for 2 days, then 3 tabs for 2 days, 2 tabs for 2 days, then 1 tab by mouth daily for 2 days, Print       I personally performed the services described in this documentation, which was scribed in my presence. The recorded information has been reviewed  and is accurate.    Georgiana Shore, PA-C 04/30/16 1802    Jacalyn Lefevre, MD 05/03/16 [phone]

## 2016-04-30 NOTE — ED Provider Notes (Signed)
Pt presents to the ED today with CP and sob.  She has an albuterol inhaler, but no spacer.  Pt is very worried that her pain is something serious.  She did get 125 mg of solumedrol, a duoneb, and toradol.  Sx are much better.  CT chest did not show PE.  Pt given a spacer and will be d/c with prednisone, toradol, and claritin.  She is given the number of [facility] and instructed to return if worse.   Jacalyn Lefevre, MD 04/30/16 [phone]

## 2016-04-30 NOTE — ED Triage Notes (Signed)
Patient complains of 2 days of cough and congestion. Now complains of pain with inspiration. Using inhaler but continuing to have pain. NAD

## 2016-08-07 ENCOUNTER — Encounter (HOSPITAL_COMMUNITY): Payer: Self-pay | Admitting: Emergency Medicine

## 2016-08-07 DIAGNOSIS — N938 Other specified abnormal uterine and vaginal bleeding: Secondary | ICD-10-CM | POA: Insufficient documentation

## 2016-08-07 DIAGNOSIS — Z5321 Procedure and treatment not carried out due to patient leaving prior to being seen by health care provider: Secondary | ICD-10-CM | POA: Insufficient documentation

## 2016-08-07 LAB — I-STAT BETA HCG BLOOD, ED (MC, WL, AP ONLY): I-stat hCG, quantitative: <5 m[IU]/mL (ref <5)

## 2016-08-07 NOTE — ED Triage Notes (Signed)
Pt reports heavy vaginal bleeding since this AM with clots. Also endorses lower abd cramping.

## 2016-08-08 ENCOUNTER — Emergency Department (HOSPITAL_COMMUNITY)
Admission: EM | Admit: 2016-08-08 | Discharge: 2016-08-08 | Disposition: A | Discharge status: Home / Self Care | Payer: Medicaid Other | Attending: Emergency Medicine | Admitting: Emergency Medicine

## 2016-08-08 ENCOUNTER — Emergency Department (HOSPITAL_COMMUNITY)
Admission: EM | Admit: 2016-08-08 | Discharge: 2016-08-08 | Discharge status: Against Medical Advice | Payer: BLUE CROSS/BLUE SHIELD | Attending: Emergency Medicine | Admitting: Emergency Medicine

## 2016-08-08 ENCOUNTER — Encounter (HOSPITAL_COMMUNITY): Payer: Self-pay

## 2016-08-08 DIAGNOSIS — J45909 Unspecified asthma, uncomplicated: Secondary | ICD-10-CM | POA: Diagnosis not present

## 2016-08-08 DIAGNOSIS — Z87891 Personal history of nicotine dependence: Secondary | ICD-10-CM | POA: Diagnosis not present

## 2016-08-08 DIAGNOSIS — Z79899 Other long term (current) drug therapy: Secondary | ICD-10-CM | POA: Diagnosis not present

## 2016-08-08 DIAGNOSIS — N939 Abnormal uterine and vaginal bleeding, unspecified: Secondary | ICD-10-CM

## 2016-08-08 LAB — CBC WITH DIFFERENTIAL/PLATELET
BASOS PCT: 0 %
Basophils Absolute: 0 10*3/uL (ref 0.0–0.1)
EOS ABS: 0.3 10*3/uL (ref 0.0–0.7)
EOS PCT: 3 %
HCT: 37.4 % (ref 36.0–46.0)
HEMOGLOBIN: 12.6 g/dL (ref 12.0–15.0)
Lymphocytes Relative: 40 %
Lymphs Abs: 4.2 10*3/uL — ABNORMAL HIGH (ref 0.7–4.0)
MCH: 27.3 pg (ref 26.0–34.0)
MCHC: 33.7 g/dL (ref 30.0–36.0)
MCV: 81 fL (ref 78.0–100.0)
MONOS PCT: 5 %
Monocytes Absolute: 0.6 10*3/uL (ref 0.1–1.0)
NEUTROS PCT: 52 %
Neutro Abs: 5.5 10*3/uL (ref 1.7–7.7)
PLATELETS: 331 10*3/uL (ref 150–400)
RBC: 4.62 MIL/uL (ref 3.87–5.11)
RDW: 15 % (ref 11.5–15.5)
WBC: 10.6 10*3/uL — ABNORMAL HIGH (ref 4.0–10.5)

## 2016-08-08 LAB — BASIC METABOLIC PANEL
Anion gap: 8 (ref 5–15)
BUN: 10 mg/dL (ref 6–20)
CALCIUM: 9.1 mg/dL (ref 8.9–10.3)
CHLORIDE: 105 mmol/L (ref 101–111)
CO2: 25 mmol/L (ref 22–32)
CREATININE: 1.06 mg/dL — AB (ref 0.44–1.00)
Glucose, Bld: 117 mg/dL — ABNORMAL HIGH (ref 65–99)
Potassium: 3.7 mmol/L (ref 3.5–5.1)
SODIUM: 138 mmol/L (ref 135–145)

## 2016-08-08 LAB — WET PREP, GENITAL
Clue Cells Wet Prep HPF POC: NONE SEEN
Sperm: NONE SEEN
TRICH WET PREP: NONE SEEN
WBC, Wet Prep HPF POC: NONE SEEN
YEAST WET PREP: NONE SEEN

## 2016-08-08 MED ORDER — IBUPROFEN 800 MG PO TABS
800.0000 mg | ORAL_TABLET | Freq: Once | ORAL | Status: AC
  Administered 2016-08-08: 800 mg via ORAL
  Filled 2016-08-08: qty 1

## 2016-08-08 NOTE — ED Notes (Signed)
Pt informed of wait time, pt walked outside.

## 2016-08-08 NOTE — ED Triage Notes (Signed)
Patient complains of abdominal cramping and heavy vaginal bleeding with clots. Here last night and left prior to being seen. Alert and oriented, NAD. Had lab work last night

## 2016-08-08 NOTE — ED Notes (Signed)
Pt walked back into ED and discharge canceled

## 2016-08-08 NOTE — Discharge Instructions (Signed)
Your labs today look good, your hemoglobin was normal.  STD cultures should come back tomorrow or the next day, we will cal you if they are abnormal. I recommend that you [facility] for management of irregular vaginal bleeding. You can return here for any new or worsening symptoms.

## 2016-08-08 NOTE — ED Notes (Signed)
Pelvic cart at bedside. 

## 2016-08-08 NOTE — ED Notes (Signed)
Called to be taken to treatment room, no response

## 2016-08-08 NOTE — ED Notes (Signed)
Called x3  not in lobby or outside ED.

## 2016-08-08 NOTE — ED Notes (Signed)
Called in waiting area without response

## 2016-08-08 NOTE — ED Notes (Signed)
Pt did not return from outside. Unable to find pt.

## 2016-08-08 NOTE — ED Provider Notes (Signed)
[facility] Provider Note   CSN: [id number] Arrival date & time: 08/08/16  0747     History   Chief Complaint No chief complaint on file.   HPI SMANTHA BOAKYE is a 26 y.o. female.  The history is provided by the patient and medical records.     26 year old female with history of asthma, obesity, reported anemia, presenting to the ED for heavy vaginal bleeding. States this began on Sunday.  States usually her cycles are regular, occurring about every 28 days or so. States usually they start light, will become heavy, and will return to light flow again.  States it is time for her cycle, but she was alarmed because this one started off really heavy with clots intermixed. States she does have some lower abdominal cramping which she states does feel like regular menstrual cramps.  She denies any nausea, vomiting, or diarrhea. States she feels like she is losing a lot of blood and it is making her lightheaded. States she does have history of anemia, states her hemoglobin is usually around 8 or 9. She is supposed to be taking iron supplements, but she stopped taking them secondary to GI upset.  She is not currently on birth control. She does not follow regularly with a GYN. Patient came to the ED last night and had a pregnancy test done which was negative. She states the wait was too long so she decided to go home and go to sleep.  States she does feel that she has had some discharge recently.  Unsure about STD status.  States she has not been tested in a few months and has had new sexual partners.  Past Medical History:  Diagnosis Date  . Asthma   . Obesity     There are no active problems to display for this patient.   Past Surgical History:  Procedure Laterality Date  . WISDOM TOOTH EXTRACTION      OB History    Gravida Para Term Preterm AB Living   0             SAB TAB Ectopic Multiple Live Births                   Home Medications    Prior to Admission medications    Medication Sig Start Date End Date Taking? Authorizing Provider  albuterol (PROVENTIL HFA;VENTOLIN HFA) 108 (90 Base) MCG/ACT inhaler Inhale 1-2 puffs into the lungs every 6 (six) hours as needed for wheezing or shortness of breath.    [provider]  cetirizine (ZYRTEC) 10 MG tablet Take 1 tablet (10 mg total) by mouth daily. Patient not taking: Reported on 04/30/2016 04/19/16   Joycie Peek, PA-C  ibuprofen (ADVIL,MOTRIN) 200 MG tablet Take 800 mg by mouth every 6 (six) hours as needed for mild pain.    [provider]  ipratropium (ATROVENT) 0.06 % nasal spray Place 2 sprays into both nostrils 4 (four) times daily. Patient not taking: Reported on 04/30/2016 04/19/16   Joycie Peek, PA-C  ketorolac (TORADOL) 10 MG tablet Take 1 tablet (10 mg total) by mouth every 6 (six) hours as needed. 04/30/16   Jacalyn Lefevre, MD  loperamide (IMODIUM) 2 MG capsule Take 1 capsule (2 mg total) by mouth 4 (four) times daily as needed for diarrhea or loose stools. Patient not taking: Reported on 04/06/2016 12/05/15   Horton, Mayer Masker, MD  loratadine (CLARITIN) 10 MG tablet Take 1 tablet (10 mg total) by mouth daily. 04/30/16  Jacalyn LefevreHaviland, Julie, MD  oxymetazoline [facility] NASAL SPRAY) 0.05 % nasal spray Place 1 spray into both nostrils 2 (two) times daily. Apply 1 spray to both nostrils twice daily for the next 3 days. Do not use for longer than 3 days to prevent rebound rhinorrhea. Patient not taking: Reported on 04/06/2016 09/24/15   Barrett HenleNadeau, Nicole Elizabeth, PA-C  predniSONE (STERAPRED UNI-PAK 21 TAB) 10 MG (21) TBPK tablet Take 6 tabs by mouth daily  for 2 days, then 5 tabs for 2 days, then 4 tabs for 2 days, then 3 tabs for 2 days, 2 tabs for 2 days, then 1 tab by mouth daily for 2 days 04/30/16   Jacalyn LefevreHaviland, Julie, MD  promethazine (PHENERGAN) 25 MG tablet Take 1 tablet (25 mg total) by mouth every 6 (six) hours as needed for nausea or vomiting. Patient not taking: Reported on 04/06/2016 09/25/15    Pisciotta, Joni ReiningNicole, PA-C  traMADol (ULTRAM) 50 MG tablet Take 1 tablet (50 mg total) by mouth every 6 (six) hours as needed. Patient not taking: Reported on 04/06/2016 05/30/15   Gilda CreasePollina, Christopher J, MD    Family History Family History  Problem Relation Age of Onset  . Hypertension Mother   . Sickle cell anemia Mother   . Diabetes Father   . Hypertension Father   . Diabetes Maternal Grandmother   . Diabetes Paternal Grandfather     Social History Social History  Substance Use Topics  . Smoking status: Former Smoker    Packs/day: 1.00    Types: Cigarettes  . Smokeless tobacco: Never Used  . Alcohol use Yes     Comment: Occasional      Allergies   Patient has no known allergies.   Review of Systems Review of Systems  Genitourinary: Positive for vaginal bleeding.  All other systems reviewed and are negative.    Physical Exam Updated Vital Signs BP (!) 93/59   Pulse 80   Temp 98.4 F (36.9 C) (Oral)   Resp 18   LMP 08/06/2016 (Exact Date)   SpO2 100%   Physical Exam  Constitutional: She is oriented to person, place, and time. She appears well-developed and well-nourished.  HENT:  Head: Normocephalic and atraumatic.  Mouth/Throat: Oropharynx is clear and moist.  Eyes: Pupils are equal, round, and reactive to light. Conjunctivae and EOM are normal.  Neck: Normal range of motion.  Cardiovascular: Normal rate, regular rhythm and normal heart sounds.   Pulmonary/Chest: Effort normal and breath sounds normal. No respiratory distress. She has no wheezes.  Abdominal: Soft. Bowel sounds are normal.  Genitourinary:  Genitourinary Comments: Exam chaperoned by RN Normal female external genitalia without visible lesions or rashes; moderate amount of vaginal bleeding noted, no appreciable clots; cervical os closed, no masses, no adnexal or CMT  Musculoskeletal: Normal range of motion.  Neurological: She is alert and oriented to person, place, and time.  Skin: Skin is  warm and dry.  Psychiatric: She has a normal mood and affect.  Nursing note and vitals reviewed.    ED Treatments / Results  Labs (all labs ordered are listed, but only abnormal results are displayed) Labs Reviewed  CBC WITH DIFFERENTIAL/PLATELET - Abnormal; Notable for the following:       Result Value   WBC 10.6 (*)    Lymphs Abs 4.2 (*)    All other components within normal limits  BASIC METABOLIC PANEL - Abnormal; Notable for the following:    Glucose, Bld 117 (*)    Creatinine, Ser 1.06 (*)  All other components within normal limits  WET PREP, GENITAL  RPR  HIV ANTIBODY (ROUTINE TESTING)  GC/CHLAMYDIA PROBE AMP ([place]) NOT AT [facility]    EKG  EKG Interpretation None       Radiology No results found.  Procedures Procedures (including critical care time)  Medications Ordered in ED Medications - No data to display   Initial Impression / Assessment and Plan / ED Course  I have reviewed the triage vital signs and the nursing notes.  Pertinent labs & imaging results that were available during my care of the patient were reviewed by me and considered in my medical decision making (see chart for details).  26 year old female here with vaginal bleeding. Came here last night and had pregnancy test which was negative but left prior to being seen due to weight time. She is afebrile and nontoxic in appearance here. Her abdomen is soft and benign. Reports bleeding is heavier than normal, but does feel it is time for her usual cycle. Patient reports history of anemia, therefore 20 labs obtained and are reassuring. Her hemoglobin is normal at 12.6. Pelvic exam performed with moderate amount of vaginal bleeding noted. I do not appreciate any clots or abnormal discharge. There is no adnexal or cervical motion tenderness. Cervical os is closed. No masses. Wet prep negative.  HIV, RPR, and gc/chl pending.  Feel patient is stable for discharge.  Advised follow-up with OB-GYN for  irregular/heavy bleeding.  Discussed plan with patient, she acknowledged understanding and agreed with plan of care.  Return precautions given for new or worsening symptoms.  Final Clinical Impressions(s) / ED Diagnoses   Final diagnoses:  Vaginal bleeding    New Prescriptions New Prescriptions   No medications on file     Garlon Hatchet, PA-C 08/08/16 1227    Long, Arlyss Repress, MD 08/08/16 [phone]

## 2016-08-09 LAB — HIV ANTIBODY (ROUTINE TESTING W REFLEX): HIV SCREEN 4TH GENERATION: NONREACTIVE

## 2016-08-09 LAB — RPR: RPR Ser Ql: NONREACTIVE

## 2016-08-09 LAB — GC/CHLAMYDIA PROBE AMP (~~LOC~~) NOT AT ARMC
Chlamydia: NEGATIVE
Neisseria Gonorrhea: NEGATIVE

## 2016-09-25 ENCOUNTER — Encounter: Payer: Self-pay | Admitting: Obstetrics & Gynecology

## 2016-09-25 ENCOUNTER — Ambulatory Visit (INDEPENDENT_AMBULATORY_CARE_PROVIDER_SITE_OTHER): Payer: BLUE CROSS/BLUE SHIELD | Admitting: Obstetrics & Gynecology

## 2016-09-25 ENCOUNTER — Ambulatory Visit (INDEPENDENT_AMBULATORY_CARE_PROVIDER_SITE_OTHER): Payer: BLUE CROSS/BLUE SHIELD | Admitting: Clinical

## 2016-09-25 VITALS — BP 118/81 | HR 90 | Wt 249.8 lb

## 2016-09-25 DIAGNOSIS — Z113 Encounter for screening for infections with a predominantly sexual mode of transmission: Secondary | ICD-10-CM

## 2016-09-25 DIAGNOSIS — Z23 Encounter for immunization: Secondary | ICD-10-CM

## 2016-09-25 DIAGNOSIS — Z124 Encounter for screening for malignant neoplasm of cervix: Secondary | ICD-10-CM | POA: Diagnosis not present

## 2016-09-25 DIAGNOSIS — Z01419 Encounter for gynecological examination (general) (routine) without abnormal findings: Secondary | ICD-10-CM

## 2016-09-25 DIAGNOSIS — F39 Unspecified mood [affective] disorder: Secondary | ICD-10-CM

## 2016-09-25 NOTE — Progress Notes (Signed)
Subjective:    Tamara Bryant is a 26 y.o. S AA P66female who presents for an annual exam. The patient has no complaints today. The patient is sexually active. GYN screening history: last pap: was normal. The patient wears seatbelts: yes. The patient participates in regular exercise: no. She just started a [employer].  Has the patient ever been transfused or tattooed?: yes. The patient reports that there is not domestic violence in her life.   Menstrual History: OB History    Gravida Para Term Preterm AB Living   0             SAB TAB Ectopic Multiple Live Births                  Menarche age: 48 Patient's last menstrual period was 08/28/2016 (approximate).    The following portions of the patient's history were reviewed and updated as appropriate: allergies, current medications, past family history, past medical history, past social history, past surgical history and problem list.  Review of Systems Pertinent items are noted in HPI.   FH- no breast/gyn/colon cancer No employed currently 6 years with boyfriend, he is not monogamous Periods monthly but not pregnancy in 6 years of unprotected sex    Objective:    BP 118/81   Pulse 90   Wt 249 lb 12.8 oz (113.3 kg)   LMP 08/28/2016 (Approximate)   BMI 42.88 kg/m   General Appearance:    Alert, cooperative, no distress, appears stated age  Head:    Normocephalic, without obvious abnormality, atraumatic  Eyes:    PERRL, conjunctiva/corneas clear, EOM's intact, fundi    benign, both eyes  Ears:    Normal TM's and external ear canals, both ears  Nose:   Nares normal, septum midline, mucosa normal, no drainage    or sinus tenderness  Throat:   Lips, mucosa, and tongue normal; teeth and gums normal  Neck:   Supple, symmetrical, trachea midline, no adenopathy;    thyroid:  no enlargement/tenderness/nodules; no carotid   bruit or JVD  Back:     Symmetric, no curvature, ROM normal, no CVA tenderness  Lungs:     Clear to  auscultation bilaterally, respirations unlabored  Chest Wall:    No tenderness or deformity   Heart:    Regular rate and rhythm, S1 and S2 normal, no murmur, rub   or gallop  Breast Exam:    No tenderness, masses, or nipple abnormality  Abdomen:     Soft, non-tender, bowel sounds active all four quadrants,    no masses, no organomegaly  Genitalia:    Normal female without lesion, discharge or tenderness     Extremities:   Extremities normal, atraumatic, no cyanosis or edema  Pulses:   2+ and symmetric all extremities  Skin:   Skin color, texture, turgor normal, no rashes or lesions  Lymph nodes:   Cervical, supraclavicular, and axillary nodes normal  Neurologic:   CNII-XII intact, normal strength, sensation and reflexes    throughout  .    Assessment:    Healthy female exam.    Plan:     Chlamydia specimen. GC specimen. Thin prep Pap smear.   STI testing Flu vaccine today

## 2016-09-25 NOTE — BH Specialist Note (Signed)
Integrated Behavioral Health Initial Visit  MRN: [id number] Name: BRINLY MAIETTA  Number of Integrated Behavioral Health Clinician visits:: 1/6 Session Start time: 9:44 Session End time: 10:00 Total time: 20 minutes  Type of Service: Integrated Behavioral Health- Individual/Family Interpretor:No. Interpretor Name and Language: n/a   Warm Hand Off Completed.       SUBJECTIVE: NIOMI VALENT is a 26 y.o. female accompanied by n/a Patient was referred by Dr Marice Potter for depression and anxiety Patient reports the following symptoms/concerns: Pt states her primary concern today is that she hasn't been treated for her self-reported bipolar disorder and schizophrenia since she was a child, was not compliant with medication at [facility], as she did not like how it made her feel. Pt agrees that she needs to establish care with a PCP and psychiatry.  Duration of problem: Since childhood; Severity of problem: severe  OBJECTIVE: Mood: Appropriate and Affect: Appropriate Risk of harm to self or others: No plan to harm self or others  LIFE CONTEXT: Family and Social: - School/Work: - Self-Care: Smokes marijuana to cope with symptoms of depression and anxiety Life Changes: None known  GOALS ADDRESSED: Patient will: 1. Reduce symptoms of: anxiety and depression 2. Increase knowledge and/or ability of: self-management skills  3. Demonstrate ability to: Increase healthy adjustment to current life circumstances and Decrease self-medicating behaviors  INTERVENTIONS: Interventions utilized: Motivational Interviewing and Psychoeducation and/or Health Education  Standardized Assessments completed: GAD-7 and PHQ 9  ASSESSMENT: Patient currently experiencing Mood disorder.   Patient may benefit from psychoeducation and brief therapeutic intervention regarding coping with symptoms of anxiety and depression.  PLAN: 1. Follow up with behavioral health clinician on : Phone f/u in one week; office  visit as needed 2. Behavioral recommendations:  -[employer]; ask to send list of primary care providers and psychiatrists who accept insurance locally -Call [facility] to set up first appointment for psychiatry -Begin using calm app at home; consider additional apps for self-coping -Read educational materials regarding coping with symptoms of anxiety and depression  3. Referral(s): Integrated [facility]) and Psychiatrist 4. "From scale of 1-10, how likely are you to follow plan?": 7  Rae Lips, LCSWA     Depression screen [facility] 2/9 09/25/2016  Decreased Interest 2  Down, Depressed, Hopeless 3  PHQ - 2 Score 5  Altered sleeping 3  Tired, decreased energy 3  Change in appetite 3  Feeling bad or failure about yourself  2  Trouble concentrating 2  Moving slowly or fidgety/restless 2  Suicidal thoughts 0  PHQ-9 Score 20   GAD 7 : Generalized Anxiety Score 09/25/2016  Nervous, Anxious, on Edge 3  Control/stop worrying 3  Worry too much - different things 3  Trouble relaxing 3  Restless 2  Easily annoyed or irritable 3  Afraid - awful might happen 2  Total GAD 7 Score 19

## 2016-09-26 LAB — HEPATITIS C ANTIBODY: Hep C Virus Ab: <0.1 s/co ratio (ref 0.0–0.9)

## 2016-09-26 LAB — CYTOLOGY - PAP
ADEQUACY: ABSENT
CHLAMYDIA, DNA PROBE: POSITIVE — AB
DIAGNOSIS: NEGATIVE
NEISSERIA GONORRHEA: NEGATIVE

## 2016-09-26 LAB — HEMOGLOBIN A1C
Est. average glucose Bld gHb Est-mCnc: 85 mg/dL
Hgb A1c MFr Bld: 4.6 % — ABNORMAL LOW (ref 4.8–5.6)

## 2016-09-26 LAB — HIV ANTIBODY (ROUTINE TESTING W REFLEX): HIV Screen 4th Generation wRfx: NONREACTIVE

## 2016-09-26 LAB — TSH: TSH: 2 u[IU]/mL (ref 0.450–4.500)

## 2016-09-26 LAB — RPR: RPR: NONREACTIVE

## 2016-09-26 LAB — HEPATITIS B SURFACE ANTIGEN: Hepatitis B Surface Ag: NEGATIVE

## 2016-09-27 ENCOUNTER — Other Ambulatory Visit: Payer: Self-pay

## 2016-09-27 DIAGNOSIS — A749 Chlamydial infection, unspecified: Secondary | ICD-10-CM

## 2016-09-27 MED ORDER — AZITHROMYCIN 250 MG PO TABS: 1000.0000 mg | ORAL_TABLET | Freq: Once | ORAL | 0 refills | Status: AC

## 2016-09-27 NOTE — Progress Notes (Signed)
Called patient to inform her that she tested positive for chlamydia. Informed patient that medication will be sent to her pharmacy. Patient verbalized understanding.

## 2016-09-28 ENCOUNTER — Encounter: Payer: Self-pay | Admitting: General Practice

## 2016-10-02 ENCOUNTER — Telehealth: Payer: Self-pay | Admitting: Clinical

## 2016-10-02 NOTE — Telephone Encounter (Signed)
F/u call, as requested at last visit: pt has not contacted her insurance company([employer]) to find out her options for PCPs and Psychiatry, as she wanted to do at last visit.  Pt prefers to "call around and ask". Pt is thinking about going to either [facility] or [facility], but uncertain if that is the route she wants to take; agrees to accept a call from [facility] in one week for an additional f/u on her progress.

## 2016-11-13 ENCOUNTER — Other Ambulatory Visit: Payer: Self-pay

## 2016-11-13 ENCOUNTER — Emergency Department (HOSPITAL_COMMUNITY)
Admission: EM | Admit: 2016-11-13 | Discharge: 2016-11-13 | Disposition: A | Discharge status: Home / Self Care | Payer: BLUE CROSS/BLUE SHIELD | Attending: Emergency Medicine | Admitting: Emergency Medicine

## 2016-11-13 ENCOUNTER — Encounter (HOSPITAL_COMMUNITY): Payer: Self-pay

## 2016-11-13 DIAGNOSIS — J45909 Unspecified asthma, uncomplicated: Secondary | ICD-10-CM | POA: Diagnosis not present

## 2016-11-13 DIAGNOSIS — Z20818 Contact with and (suspected) exposure to other bacterial communicable diseases: Secondary | ICD-10-CM | POA: Diagnosis not present

## 2016-11-13 DIAGNOSIS — H6691 Otitis media, unspecified, right ear: Secondary | ICD-10-CM | POA: Diagnosis not present

## 2016-11-13 DIAGNOSIS — F17290 Nicotine dependence, other tobacco product, uncomplicated: Secondary | ICD-10-CM | POA: Diagnosis not present

## 2016-11-13 DIAGNOSIS — R51 Headache: Secondary | ICD-10-CM | POA: Insufficient documentation

## 2016-11-13 DIAGNOSIS — J029 Acute pharyngitis, unspecified: Secondary | ICD-10-CM | POA: Diagnosis present

## 2016-11-13 DIAGNOSIS — H6690 Otitis media, unspecified, unspecified ear: Secondary | ICD-10-CM

## 2016-11-13 DIAGNOSIS — Z79899 Other long term (current) drug therapy: Secondary | ICD-10-CM | POA: Diagnosis not present

## 2016-11-13 LAB — RAPID STREP SCREEN (MED CTR MEBANE ONLY): Streptococcus, Group A Screen (Direct): NEGATIVE

## 2016-11-13 MED ORDER — AMOXICILLIN 500 MG PO CAPS: 500.0000 mg | ORAL_CAPSULE | Freq: Three times a day (TID) | ORAL | 0 refills | Status: DC

## 2016-11-13 MED ORDER — BENZONATATE 100 MG PO CAPS: 100.0000 mg | ORAL_CAPSULE | Freq: Three times a day (TID) | ORAL | 0 refills | Status: DC

## 2016-11-13 NOTE — ED Notes (Signed)
Bed: WTR6 Expected date:  Expected time:  Means of arrival:  Comments: EMS-ear and throat pain

## 2016-11-13 NOTE — ED Triage Notes (Signed)
Per EMS, patient comes from [facility]. Pt c/o R ear and throat pain since yesterday that has been getting worse. Hurts worse with swallowing. Pt does have a hx of seasonal allergies. Pt came via EMS, as no vehicle.

## 2016-11-13 NOTE — ED Provider Notes (Signed)
[facility]-EMERGENCY DEPT Provider Note   CSN: [id number] Arrival date & time: 11/13/16  1305     History   Chief Complaint Chief Complaint  Patient presents with  . Sore Throat  . Facial Pain    HPI Tamara Bryant is a 26 y.o. female.  HPI   26 year old female presents today with complaints of sore throat and ear pain.  Patient notes a 1 day history of right ear pain, sore throat, cough.  She notes painful swallowing, no difficulty swallowing.  Patient does note a history of allergies.  No medications prior to arrival today.  Patient denies shortness of breath.  Patient notes close sick contact with strep throat  Past Medical History:  Diagnosis Date  . Asthma   . Obesity     There are no active problems to display for this patient.   Past Surgical History:  Procedure Laterality Date  . WISDOM TOOTH EXTRACTION      OB History    Gravida Para Term Preterm AB Living   0             SAB TAB Ectopic Multiple Live Births                   Home Medications    Prior to Admission medications   Medication Sig Start Date End Date Taking? Authorizing Provider  albuterol (PROVENTIL HFA;VENTOLIN HFA) 108 (90 Base) MCG/ACT inhaler Inhale 1-2 puffs into the lungs every 6 (six) hours as needed for wheezing or shortness of breath.    [provider]  amoxicillin (AMOXIL) 500 MG capsule Take 1 capsule (500 mg total) 3 (three) times daily by mouth. 11/13/16   Samanda Buske, Tinnie Gens, PA-C  benzonatate (TESSALON) 100 MG capsule Take 1 capsule (100 mg total) every 8 (eight) hours by mouth. 11/13/16   Ahren Pettinger, Tinnie Gens, PA-C  cetirizine (ZYRTEC) 10 MG tablet Take 1 tablet (10 mg total) by mouth daily. Patient not taking: Reported on 04/30/2016 04/19/16   Joycie Peek, PA-C  ibuprofen (ADVIL,MOTRIN) 200 MG tablet Take 800 mg by mouth every 6 (six) hours as needed for mild pain.    [provider]  ipratropium (ATROVENT) 0.06 % nasal spray Place 2  sprays into both nostrils 4 (four) times daily. Patient not taking: Reported on 04/30/2016 04/19/16   Joycie Peek, PA-C  ketorolac (TORADOL) 10 MG tablet Take 1 tablet (10 mg total) by mouth every 6 (six) hours as needed. 04/30/16   Jacalyn Lefevre, MD  loperamide (IMODIUM) 2 MG capsule Take 1 capsule (2 mg total) by mouth 4 (four) times daily as needed for diarrhea or loose stools. Patient not taking: Reported on 04/06/2016 12/05/15   Horton, Mayer Masker, MD  loratadine (CLARITIN) 10 MG tablet Take 1 tablet (10 mg total) by mouth daily. 04/30/16   Jacalyn Lefevre, MD  oxymetazoline (AFRIN NASAL SPRAY) 0.05 % nasal spray Place 1 spray into both nostrils 2 (two) times daily. Apply 1 spray to both nostrils twice daily for the next 3 days. Do not use for longer than 3 days to prevent rebound rhinorrhea. Patient not taking: Reported on 04/06/2016 09/24/15   [name], PA-C  predniSONE (STERAPRED UNI-PAK 21 TAB) 10 MG (21) TBPK tablet Take 6 tabs by mouth daily  for 2 days, then 5 tabs for 2 days, then 4 tabs for 2 days, then 3 tabs for 2 days, 2 tabs for 2 days, then 1 tab by mouth daily for 2 days 04/30/16  Jacalyn LefevreHaviland, Julie, MD  promethazine (PHENERGAN) 25 MG tablet Take 1 tablet (25 mg total) by mouth every 6 (six) hours as needed for nausea or vomiting. Patient not taking: Reported on 04/06/2016 09/25/15   Pisciotta, Joni ReiningNicole, PA-C  traMADol (ULTRAM) 50 MG tablet Take 1 tablet (50 mg total) by mouth every 6 (six) hours as needed. Patient not taking: Reported on 04/06/2016 05/30/15   Gilda CreasePollina, Christopher J, MD    Family History Family History  Problem Relation Age of Onset  . Hypertension Mother   . Sickle cell anemia Mother   . Diabetes Father   . Hypertension Father   . Diabetes Maternal Grandmother   . Diabetes Paternal Grandfather     Social History Social History   Tobacco Use  . Smoking status: Current Every Day Smoker    Packs/day: 1.00    Types: E-cigarettes  . Smokeless  tobacco: Never Used  Substance Use Topics  . Alcohol use: Yes    Comment: Occasional   . Drug use: No     Allergies   Patient has no known allergies.   Review of Systems Review of Systems  All other systems reviewed and are negative.    Physical Exam Updated Vital Signs BP 130/87 (BP Location: Left Arm)   Pulse 92   Temp 98.2 F (36.8 C) (Oral)   Resp 18   Ht 5\' 4"  (1.626 m)   Wt 112.9 kg (249 lb)   LMP 11/13/2016   SpO2 100%   BMI 42.74 kg/m   Physical Exam  Constitutional: She is oriented to person, place, and time. She appears well-developed and well-nourished.  HENT:  Head: Normocephalic and atraumatic.  Right Ear: Hearing normal.  Mouth/Throat: Uvula is midline, oropharynx is clear and moist and mucous membranes are normal. No oral lesions. No uvula swelling. No oropharyngeal exudate, posterior oropharyngeal edema, posterior oropharyngeal erythema or tonsillar abscesses. Tonsils are 2+ on the right. Tonsils are 2+ on the left. No tonsillar exudate.  Bulging erythematous right TM-left normal   Eyes: Conjunctivae are normal. Pupils are equal, round, and reactive to light. Right eye exhibits no discharge. Left eye exhibits no discharge. No scleral icterus.  Neck: Normal range of motion. No JVD present. No tracheal deviation present.  Pulmonary/Chest: Effort normal. No stridor. No respiratory distress. She has no wheezes. She has no rales. She exhibits no tenderness.  Neurological: She is alert and oriented to person, place, and time. Coordination normal.  Psychiatric: She has a normal mood and affect. Her behavior is normal. Judgment and thought content normal.  Nursing note and vitals reviewed.    ED Treatments / Results  Labs (all labs ordered are listed, but only abnormal results are displayed) Labs Reviewed  RAPID STREP SCREEN (NOT AT [facility])  CULTURE, GROUP A STREP [facility]    EKG  EKG Interpretation None       Radiology No results  found.  Procedures Procedures (including critical care time)  Medications Ordered in ED Medications - No data to display   Initial Impression / Assessment and Plan / ED Course  I have reviewed the triage vital signs and the nursing notes.  Pertinent labs & imaging results that were available during my care of the patient were reviewed by me and considered in my medical decision making (see chart for details).      Final Clinical Impressions(s) / ED Diagnoses   Final diagnoses:  Acute otitis media, unspecified otitis media type    26 year old female presents today with  upper respiratory infection.  Patient has acute otitis media on the right.  She is afebrile in no acute distress.  Rapid strep negative here.  Patient with clear lung sounds.  Patient will be instructed to continue using Tylenol or ibuprofen, salt water gargles, warm tea, rest.  She is instructed to initiate antibiotic therapy.  Patient given strict return precautions, follow-up information.  She verbalized understanding and agreement to today's plan had no further questions or concerns the time discharge.  ED Discharge Orders        Ordered    amoxicillin (AMOXIL) 500 MG capsule  3 times daily     11/13/16 1339    benzonatate (TESSALON) 100 MG capsule  Every 8 hours     11/13/16 1339       Eyvonne MechanicHedges, Anita Mcadory, PA-C 11/13/16 1351    Rolland PorterJames, Mark, MD 11/19/16 2303

## 2016-11-13 NOTE — Discharge Instructions (Signed)
Please read attached information. If you experience any new or worsening signs or symptoms please return to the emergency room for evaluation. Please follow-up with your primary care provider or specialist as discussed. Please use medication prescribed only as directed and discontinue taking if you have any concerning signs or symptoms.   °

## 2016-11-15 LAB — CULTURE, GROUP A STREP (THRC)

## 2016-12-16 ENCOUNTER — Other Ambulatory Visit: Payer: Self-pay

## 2016-12-16 ENCOUNTER — Emergency Department (HOSPITAL_COMMUNITY)
Admission: EM | Admit: 2016-12-16 | Discharge: 2016-12-16 | Disposition: A | Discharge status: Home / Self Care | Payer: BLUE CROSS/BLUE SHIELD | Attending: Emergency Medicine | Admitting: Emergency Medicine

## 2016-12-16 ENCOUNTER — Encounter (HOSPITAL_COMMUNITY): Payer: Self-pay | Admitting: Emergency Medicine

## 2016-12-16 DIAGNOSIS — F17290 Nicotine dependence, other tobacco product, uncomplicated: Secondary | ICD-10-CM | POA: Insufficient documentation

## 2016-12-16 DIAGNOSIS — J45909 Unspecified asthma, uncomplicated: Secondary | ICD-10-CM | POA: Diagnosis not present

## 2016-12-16 DIAGNOSIS — N61 Mastitis without abscess: Secondary | ICD-10-CM | POA: Diagnosis not present

## 2016-12-16 DIAGNOSIS — R300 Dysuria: Secondary | ICD-10-CM | POA: Diagnosis not present

## 2016-12-16 LAB — URINALYSIS, ROUTINE W REFLEX MICROSCOPIC
Bilirubin Urine: NEGATIVE
Glucose, UA: NEGATIVE mg/dL
Hgb urine dipstick: NEGATIVE
Ketones, ur: NEGATIVE mg/dL
Nitrite: NEGATIVE
Protein, ur: NEGATIVE mg/dL
Specific Gravity, Urine: 1.015 (ref 1.005–1.030)
pH: 7 (ref 5.0–8.0)

## 2016-12-16 LAB — POC URINE PREG, ED: Preg Test, Ur: NEGATIVE

## 2016-12-16 MED ORDER — CEPHALEXIN 500 MG PO CAPS: ORAL_CAPSULE | ORAL | 0 refills | Status: DC

## 2016-12-16 NOTE — ED Notes (Signed)
Pt in doorway states she is going to leave in a few minutes if she isn't seen soon.  States she was told she was coming back to be seen by a doctor immediately.

## 2016-12-16 NOTE — ED Triage Notes (Signed)
Pt reports getting her nipples pierced  1 month ago and is now having bilateral pain with discharge. Pt also reports frequent urination with burning when she pees.

## 2016-12-16 NOTE — Discharge Instructions (Signed)
Contact a health care provider if: You have a fever. Your symptoms do not improve within 1-2 days of starting treatment. Your bone or joint underneath the infected area becomes painful after the skin has healed. Your infection returns in the same area or another area. You notice a swollen bump in the infected area. You develop new symptoms. You have a general ill feeling (malaise) with muscle aches and pains. Get help right away if: Your symptoms get worse. You feel very sleepy. You develop vomiting or diarrhea that persists. You notice red streaks coming from the infected area. Your red area gets larger or turns dark in color. Free HIV and STD Testing These locations offer FREE confidential testing for HIV, Chlamydia, Gonorrhea, and Syphilis. Non-Traditional Testing Sites Address Telephone  [facility] [address], [place] [phone] Mondays 5pm - 7pm  [facility] [address] [place] [phone] Wednesdays 2pm-8pm  [facility] [address], [place] [phone] Thursdays 9am-12noon 1pm-4pm  [facility] [address], [place] [phone] Tuesdays Thursdays 9am-12noon 1pm-4pm  [facility] offers free, confidential testing and treatment for HIV, Chlamydia, Gonorrhea, Syphilis, Herpes, Bacterial Vaginosis, Yeast, and Trichomoniasis. Traditional Testing   [facility] [address], [place] [phone]  Monday thru Friday  Call for an appointment  [facility] [address] [place] [phone] Monday thru Friday  Call for anappointment.  If you have any questions about this information please call [phone]. 11/10/2010

## 2016-12-16 NOTE — ED Provider Notes (Signed)
[facility] EMERGENCY DEPARTMENT Provider Note   CSN: [id number] Arrival date & time: 12/16/16  1101     History   Chief Complaint Chief Complaint  Patient presents with  . Urinary Frequency  . Wound Infection    HPI Tamara Bryant is a 26 y.o. female who presents for dysuria and nipple pain .She has had burning with urination for the past 2 days.  She denies any vaginal symptoms, abdominal pain, fevers or chills.  She denies flank pain.  Patient is also complaining of pain at her nipples where she has had them pierced for the past 2 months.  She just changed her nipple rings about a week ago when she noticed discharge and irritation from the piercing sites.  She has no previous allergies to metals.  HPI  Past Medical History:  Diagnosis Date  . Asthma   . Obesity     There are no active problems to display for this patient.   Past Surgical History:  Procedure Laterality Date  . WISDOM TOOTH EXTRACTION      OB History    Gravida Para Term Preterm AB Living   0             SAB TAB Ectopic Multiple Live Births                   Home Medications    Prior to Admission medications   Medication Sig Start Date End Date Taking? Authorizing Provider  albuterol (PROVENTIL HFA;VENTOLIN HFA) 108 (90 Base) MCG/ACT inhaler Inhale 1-2 puffs into the lungs every 6 (six) hours as needed for wheezing or shortness of breath.   Yes [provider]  ibuprofen (ADVIL,MOTRIN) 200 MG tablet Take 800 mg by mouth every 6 (six) hours as needed for mild pain.   Yes [provider]  benzonatate (TESSALON) 100 MG capsule Take 1 capsule (100 mg total) every 8 (eight) hours by mouth. Patient not taking: Reported on 12/16/2016 11/13/16   Hedges, Tinnie Gens, PA-C  cetirizine (ZYRTEC) 10 MG tablet Take 1 tablet (10 mg total) by mouth daily. Patient not taking: Reported on 04/30/2016 04/19/16   Joycie Peek, PA-C  ipratropium (ATROVENT) 0.06 % nasal spray  Place 2 sprays into both nostrils 4 (four) times daily. Patient not taking: Reported on 04/30/2016 04/19/16   Joycie Peek, PA-C  ketorolac (TORADOL) 10 MG tablet Take 1 tablet (10 mg total) by mouth every 6 (six) hours as needed. Patient not taking: Reported on 12/16/2016 04/30/16   Jacalyn Lefevre, MD  loperamide (IMODIUM) 2 MG capsule Take 1 capsule (2 mg total) by mouth 4 (four) times daily as needed for diarrhea or loose stools. Patient not taking: Reported on 04/06/2016 12/05/15   Horton, Mayer Masker, MD  loratadine (CLARITIN) 10 MG tablet Take 1 tablet (10 mg total) by mouth daily. Patient not taking: Reported on 12/16/2016 04/30/16   Jacalyn Lefevre, MD  oxymetazoline [facility] NASAL SPRAY) 0.05 % nasal spray Place 1 spray into both nostrils 2 (two) times daily. Apply 1 spray to both nostrils twice daily for the next 3 days. Do not use for longer than 3 days to prevent rebound rhinorrhea. Patient not taking: Reported on 04/06/2016 09/24/15   [name], PA-C  promethazine (PHENERGAN) 25 MG tablet Take 1 tablet (25 mg total) by mouth every 6 (six) hours as needed for nausea or vomiting. Patient not taking: Reported on 04/06/2016 09/25/15   Pisciotta, Joni Reining, PA-C  traMADol (ULTRAM) 50 MG tablet  Take 1 tablet (50 mg total) by mouth every 6 (six) hours as needed. Patient not taking: Reported on 04/06/2016 05/30/15   Gilda CreasePollina, Christopher J, MD    Family History Family History  Problem Relation Age of Onset  . Hypertension Mother   . Sickle cell anemia Mother   . Diabetes Father   . Hypertension Father   . Diabetes Maternal Grandmother   . Diabetes Paternal Grandfather     Social History Social History   Tobacco Use  . Smoking status: Current Every Day Smoker    Packs/day: 1.00    Types: E-cigarettes  . Smokeless tobacco: Never Used  Substance Use Topics  . Alcohol use: Yes    Comment: Occasional   . Drug use: No     Allergies   Patient has no known allergies.   Review  of Systems Review of Systems  Ten systems reviewed and are negative for acute change, except as noted in the HPI.   Physical Exam Updated Vital Signs BP (!) 127/95 (BP Location: Right Arm)   Pulse 92   Temp 98 F (36.7 C) (Oral)   Resp 16   Ht 5\' 4"  (1.626 m)   Wt 113.4 kg (250 lb)   LMP 11/12/2016   SpO2 99%   BMI 42.91 kg/m   Physical Exam  Constitutional: She is oriented to person, place, and time. She appears well-developed and well-nourished. No distress.  HENT:  Head: Normocephalic and atraumatic.  Eyes: Conjunctivae are normal. No scleral icterus.  Neck: Normal range of motion.  Cardiovascular: Normal rate, regular rhythm and normal heart sounds. Exam reveals no gallop and no friction rub.  No murmur heard. Pulmonary/Chest: Effort normal and breath sounds normal. No respiratory distress.  Abdominal: Soft. Bowel sounds are normal. She exhibits no distension and no mass. There is no tenderness. There is no guarding.  No suprapubic tenderness, no CVA tenderness  Genitourinary:  Genitourinary Comments: Barbell nipple piercings and bilateral nipples.  Minimal crusting at the edges, no erythema, no breast tenderness, no signs of cellulitis  Neurological: She is alert and oriented to person, place, and time.  Skin: Skin is warm and dry. She is not diaphoretic.  Psychiatric: Her behavior is normal.  Nursing note and vitals reviewed.    ED Treatments / Results  Labs (all labs ordered are listed, but only abnormal results are displayed) Labs Reviewed  URINALYSIS, ROUTINE W REFLEX MICROSCOPIC - Abnormal; Notable for the following components:      Result Value   Leukocytes, UA TRACE (*)    Bacteria, UA RARE (*)    Squamous Epithelial / LPF 0-5 (*)    All other components within normal limits  POC URINE PREG, ED    EKG  EKG Interpretation None       Radiology No results found.  Procedures Procedures (including critical care time)  Medications Ordered in  ED Medications - No data to display   Initial Impression / Assessment and Plan / ED Course  I have reviewed the triage vital signs and the nursing notes.  Pertinent labs & imaging results that were available during my care of the patient were reviewed by me and considered in my medical decision making (see chart for details).     Patient with nipple irritation and crusting.  May be early infection versus intolerance of the metal she is used for piercings.  Will treat with Keflex.  Her urinalysis is negative.  At discharge the patient stated that she wanted to  be tested for STDs however does not want to wait.  I have given her referral to the [facility] for free testing.  She appears appropriate for discharge.  Final Clinical Impressions(s) / ED Diagnoses   Final diagnoses:  Nipple infection  Dysuria    ED Discharge Orders    None       Arthor CaptainHarris, Vaun Hyndman, PA-C 12/16/16 1704    Rolan BuccoBelfi, Melanie, MD 12/17/16 [phone]

## 2016-12-16 NOTE — ED Notes (Signed)
Pt stable, ambulatory, states understanding of discharge instructions 

## 2017-01-05 ENCOUNTER — Encounter (HOSPITAL_COMMUNITY): Payer: Self-pay

## 2017-01-05 ENCOUNTER — Emergency Department (HOSPITAL_COMMUNITY)
Admission: EM | Admit: 2017-01-05 | Discharge: 2017-01-05 | Disposition: A | Discharge status: Home / Self Care | Payer: BLUE CROSS/BLUE SHIELD | Attending: Emergency Medicine | Admitting: Emergency Medicine

## 2017-01-05 ENCOUNTER — Emergency Department (HOSPITAL_COMMUNITY): Payer: BLUE CROSS/BLUE SHIELD

## 2017-01-05 ENCOUNTER — Other Ambulatory Visit: Payer: Self-pay

## 2017-01-05 DIAGNOSIS — J4521 Mild intermittent asthma with (acute) exacerbation: Secondary | ICD-10-CM | POA: Insufficient documentation

## 2017-01-05 DIAGNOSIS — R05 Cough: Secondary | ICD-10-CM | POA: Diagnosis present

## 2017-01-05 DIAGNOSIS — F17210 Nicotine dependence, cigarettes, uncomplicated: Secondary | ICD-10-CM | POA: Diagnosis not present

## 2017-01-05 DIAGNOSIS — J069 Acute upper respiratory infection, unspecified: Secondary | ICD-10-CM

## 2017-01-05 DIAGNOSIS — B9789 Other viral agents as the cause of diseases classified elsewhere: Secondary | ICD-10-CM

## 2017-01-05 DIAGNOSIS — Z79899 Other long term (current) drug therapy: Secondary | ICD-10-CM | POA: Diagnosis not present

## 2017-01-05 MED ORDER — PREDNISONE 10 MG PO TABS: ORAL_TABLET | ORAL | 0 refills | Status: AC

## 2017-01-05 MED ORDER — ALBUTEROL SULFATE (2.5 MG/3ML) 0.083% IN NEBU
5.0000 mg | INHALATION_SOLUTION | Freq: Once | RESPIRATORY_TRACT | Status: AC
  Administered 2017-01-05: 5 mg via RESPIRATORY_TRACT
  Filled 2017-01-05: qty 6

## 2017-01-05 MED ORDER — PREDNISONE 20 MG PO TABS
60.0000 mg | ORAL_TABLET | Freq: Once | ORAL | Status: AC
  Administered 2017-01-05: 60 mg via ORAL
  Filled 2017-01-05: qty 3

## 2017-01-05 MED ORDER — ALBUTEROL SULFATE HFA 108 (90 BASE) MCG/ACT IN AERS
2.0000 | INHALATION_SPRAY | Freq: Once | RESPIRATORY_TRACT | Status: AC
  Administered 2017-01-05: 2 via RESPIRATORY_TRACT
  Filled 2017-01-05: qty 6.7

## 2017-01-05 MED ORDER — IPRATROPIUM BROMIDE 0.02 % IN SOLN
0.5000 mg | Freq: Once | RESPIRATORY_TRACT | Status: AC
  Administered 2017-01-05: 0.5 mg via RESPIRATORY_TRACT
  Filled 2017-01-05: qty 2.5

## 2017-01-05 MED ORDER — DM-GUAIFENESIN ER 30-600 MG PO TB12: 1.0000 | ORAL_TABLET | Freq: Two times a day (BID) | ORAL | 0 refills | Status: AC

## 2017-01-05 MED ORDER — ALBUTEROL SULFATE (2.5 MG/3ML) 0.083% IN NEBU
5.0000 mg | INHALATION_SOLUTION | Freq: Once | RESPIRATORY_TRACT | Status: AC
Start: 2017-01-05 — End: 2017-01-05
  Administered 2017-01-05: 5 mg via RESPIRATORY_TRACT
  Filled 2017-01-05: qty 6

## 2017-01-05 MED ORDER — IBUPROFEN 200 MG PO TABS
600.0000 mg | ORAL_TABLET | Freq: Once | ORAL | Status: AC
  Administered 2017-01-05: 600 mg via ORAL
  Filled 2017-01-05: qty 3

## 2017-01-05 MED ORDER — AEROCHAMBER PLUS FLO-VU MEDIUM MISC
1.0000 | Freq: Once | Status: AC
  Administered 2017-01-05: 1
  Filled 2017-01-05: qty 1

## 2017-01-05 MED ORDER — SPACER/AERO CHAMBER MOUTHPIECE MISC: 1.0000 "application " | 0 refills | Status: DC | PRN

## 2017-01-05 MED ORDER — IBUPROFEN 600 MG PO TABS: 600.0000 mg | ORAL_TABLET | Freq: Four times a day (QID) | ORAL | 0 refills | Status: DC | PRN

## 2017-01-05 MED ORDER — ALBUTEROL SULFATE HFA 108 (90 BASE) MCG/ACT IN AERS: 1.0000 | INHALATION_SPRAY | Freq: Four times a day (QID) | RESPIRATORY_TRACT | 1 refills | Status: DC | PRN

## 2017-01-05 NOTE — Discharge Instructions (Signed)
Please read and follow all provided instructions.  Your diagnoses today include:  1. Mild intermittent asthma with exacerbation   2. Viral URI with cough     You appear to have an upper respiratory infection (URI). An upper respiratory tract infection, or cold, is a viral infection of the air passages leading to the lungs. It should improve gradually after 5-7 days. You may have a lingering cough that lasts for 2- 4 weeks after the infection.  Tests performed today include: Vital signs. See below for your results today.   Medications prescribed:   Take any prescribed medications only as directed. Treatment for your infection is aimed at treating the symptoms. There are no medications, such as antibiotics, that will cure your infection.   Home care instructions:  Follow any educational materials contained in this packet.   Your illness is contagious and can be spread to others, especially during the first 3 or 4 days. It cannot be cured by antibiotics or other medicines. Take basic precautions such as washing your hands often, covering your mouth when you cough or sneeze, and avoiding public places where you could spread your illness to others.   Please continue drinking plenty of fluids.  Use over-the-counter medicines as needed as directed on packaging for symptom relief.  You may also use ibuprofen or tylenol as directed on packaging for pain or fever.  Do not take multiple medicines containing Tylenol or acetaminophen to avoid taking too much of this medication.  Follow-up instructions: Please follow-up with your primary care provider in the next 3 days for further evaluation of your symptoms if you are not feeling better.   Return instructions:  Please return to the Emergency Department if you experience worsening symptoms.  RETURN IMMEDIATELY IF you develop shortness of breath, confusion or altered mental status, a new rash, become dizzy, faint, or poorly responsive, or are unable to  be cared for at home. Please return if you have persistent vomiting and cannot keep down fluids or develop a fever that is not controlled by tylenol or motrin.   Please return if you have any other emergent concerns.  Additional Information:  To find a primary care or specialty doctor please call [phone] or [phone] to access "[facility] Find a Doctor Service."  You may also go on the [facility] website at [email or web address]  There are also multiple [facility] [facility] and [facility] practices throughout the [place] that are frequently accepting new patients. You may find a clinic that is close to your home and contact them.  [facility] [address] [place] [phone]  [facility] in [place] (also locations in [place] and [place]) - [address] [place] [phone]  [facility] - [address] [place] [phone]   Your vital signs today were: BP 122/86    Pulse (!) 106    Temp 98 F (36.7 C)    Resp 18    Ht 5\' 4"  (1.626 m)    Wt 113.4 kg (250 lb)    LMP 12/05/2016    SpO2 100%    BMI 42.91 kg/m  If your blood pressure (BP) was elevated above 135/85 this visit, please have this repeated by your doctor within one month. --------------

## 2017-01-05 NOTE — ED Provider Notes (Signed)
[facility]-EMERGENCY DEPT Provider Note   CSN: [id number] Arrival date & time: 01/05/17  1006     History   Chief Complaint Chief Complaint  Patient presents with  . Cough  . Sore Throat  . Generalized Body Aches    HPI Tamara Bryant is a 27 y.o. female.  HPI  Patient is a 27 year old female with a history of asthma and obesity presenting for cough, chills, and congestion for 3 days.  Patient reports that the illness started with a nonproductive cough.  Patient reports that this progressed to cough productive of white sputum.  Patient reports that her throat is irritated after coughing.  Patient has not recorded any fevers at home, however she reports subjective chills.  Patient denies any throat swelling, difficulty swallowing, difficulty breathing.  Patient reports she has been wheezing.  Patient also has a headache at present, which is occipital and not associated with vision changes. Patient denies neck stiffness. Patient is not followed for her asthma, and does not have an inhaler at present.  Past Medical History:  Diagnosis Date  . Asthma   . Obesity     There are no active problems to display for this patient.   Past Surgical History:  Procedure Laterality Date  . WISDOM TOOTH EXTRACTION      OB History    Gravida Para Term Preterm AB Living   0             SAB TAB Ectopic Multiple Live Births                   Home Medications    Prior to Admission medications   Medication Sig Start Date End Date Taking? Authorizing Provider  albuterol (PROVENTIL HFA;VENTOLIN HFA) 108 (90 Base) MCG/ACT inhaler Inhale 1-2 puffs into the lungs every 6 (six) hours as needed for wheezing or shortness of breath. 01/05/17   Aviva KluverMurray, Rhyanna Sorce B, PA-C  benzonatate (TESSALON) 100 MG capsule Take 1 capsule (100 mg total) every 8 (eight) hours by mouth. Patient not taking: Reported on 12/16/2016 11/13/16   Hedges, Tinnie GensJeffrey, PA-C  cephALEXin [facility](KEFLEX) 500 MG capsule  2 caps po bid x 7 days Patient not taking: Reported on 01/05/2017 12/16/16   Arthor CaptainHarris, Abigail, PA-C  cetirizine (ZYRTEC) 10 MG tablet Take 1 tablet (10 mg total) by mouth daily. Patient not taking: Reported on 04/30/2016 04/19/16   Joycie Peekartner, Benjamin, PA-C  dextromethorphan-guaiFENesin [facility](MUCINEX DM) 30-600 MG 12hr tablet Take 1 tablet by mouth 2 (two) times daily for 7 days. 01/05/17 01/12/17  Aviva KluverMurray, Tharun Cappella B, PA-C  ibuprofen (ADVIL,MOTRIN) 600 MG tablet Take 1 tablet (600 mg total) by mouth every 6 (six) hours as needed. 01/05/17   Aviva KluverMurray, Gemayel Mascio B, PA-C  ipratropium (ATROVENT) 0.06 % nasal spray Place 2 sprays into both nostrils 4 (four) times daily. Patient not taking: Reported on 04/30/2016 04/19/16   Joycie Peekartner, Benjamin, PA-C  ketorolac (TORADOL) 10 MG tablet Take 1 tablet (10 mg total) by mouth every 6 (six) hours as needed. Patient not taking: Reported on 12/16/2016 04/30/16   Jacalyn LefevreHaviland, Julie, MD  loperamide (IMODIUM) 2 MG capsule Take 1 capsule (2 mg total) by mouth 4 (four) times daily as needed for diarrhea or loose stools. Patient not taking: Reported on 04/06/2016 12/05/15   Horton, Mayer Maskerourtney F, MD  loratadine (CLARITIN) 10 MG tablet Take 1 tablet (10 mg total) by mouth daily. Patient not taking: Reported on 12/16/2016 04/30/16   Jacalyn LefevreHaviland, Julie, MD  oxymetazoline [facility](AFRIN NASAL  SPRAY) 0.05 % nasal spray Place 1 spray into both nostrils 2 (two) times daily. Apply 1 spray to both nostrils twice daily for the next 3 days. Do not use for longer than 3 days to prevent rebound rhinorrhea. Patient not taking: Reported on 04/06/2016 09/24/15   [name], PA-C  predniSONE (DELTASONE) 10 MG tablet Take 5 tablets (50 mg total) by mouth daily with breakfast for 2 days, THEN 4 tablets (40 mg total) daily with breakfast for 1 day, THEN 3 tablets (30 mg total) daily with breakfast for 1 day, THEN 2 tablets (20 mg total) daily with breakfast for 1 day, THEN 1 tablet (10 mg total) daily with breakfast for 1 day.  01/05/17 01/11/17  Aviva Kluver B, PA-C  promethazine (PHENERGAN) 25 MG tablet Take 1 tablet (25 mg total) by mouth every 6 (six) hours as needed for nausea or vomiting. Patient not taking: Reported on 04/06/2016 09/25/15   Pisciotta, Joni Reining, PA-C  Spacer/Aero Chamber Mouthpiece MISC 1 application by Does not apply route as needed. 01/05/17   Aviva Kluver B, PA-C  traMADol (ULTRAM) 50 MG tablet Take 1 tablet (50 mg total) by mouth every 6 (six) hours as needed. Patient not taking: Reported on 04/06/2016 05/30/15   [name], MD    Family History Family History  Problem Relation Age of Onset  . Hypertension Mother   . Sickle cell anemia Mother   . Diabetes Father   . Hypertension Father   . Diabetes Maternal Grandmother   . Diabetes Paternal Grandfather     Social History Social History   Tobacco Use  . Smoking status: Current Every Day Smoker    Packs/day: 1.00    Types: E-cigarettes  . Smokeless tobacco: Never Used  Substance Use Topics  . Alcohol use: Yes    Comment: Occasional   . Drug use: No     Allergies   Patient has no known allergies.   Review of Systems Review of Systems  Constitutional: Negative for chills and fever.  HENT: Positive for congestion and rhinorrhea. Negative for sinus pressure, sinus pain, sore throat, tinnitus, trouble swallowing and voice change.   Respiratory: Positive for cough and wheezing.   Gastrointestinal: Negative for nausea and vomiting.  Musculoskeletal: Positive for myalgias.  Neurological: Positive for headaches.     Physical Exam Updated Vital Signs BP 122/86   Pulse (!) 106   Temp 98 F (36.7 C)   Resp 18   Ht 5\' 4"  (1.626 m)   Wt 113.4 kg (250 lb)   LMP 12/05/2016   SpO2 100%   BMI 42.91 kg/m   Physical Exam  Constitutional: She appears well-developed and well-nourished. No distress.  Sitting comfortably in bed.  HENT:  Head: Normocephalic and atraumatic.  Mouth/Throat: Uvula is midline and oropharynx  is clear and moist. No oropharyngeal exudate.  Eyes: Conjunctivae are normal. Right eye exhibits no discharge. Left eye exhibits no discharge.  EOMs normal to gross examination.  Neck: Normal range of motion.  Cardiovascular: Normal rate and regular rhythm.  No murmur heard. Intact, 2+ radial pulse.  Pulse rate is 98.  Pulmonary/Chest: Effort normal. She has wheezes. She has rhonchi.  Normal respiratory effort. Patient converses comfortably. No audible wheeze or stridor.  Abdominal: She exhibits no distension.  Musculoskeletal: Normal range of motion.  Neurological: She is alert.  Cranial nerves intact to gross observation. Patient moves extremities without difficulty.  Skin: Skin is warm and dry. She is not diaphoretic.  Psychiatric:  She has a normal mood and affect. Her behavior is normal. Judgment and thought content normal.  Nursing note and vitals reviewed.    ED Treatments / Results  Labs (all labs ordered are listed, but only abnormal results are displayed) Labs Reviewed - No data to display  EKG  EKG Interpretation None       Radiology Dg Chest 2 View  Result Date: 01/05/2017 CLINICAL DATA:  Shortness of breath and congestion for few days, initial encounter EXAM: CHEST  2 VIEW COMPARISON:  04/29/2016 FINDINGS: The heart size and mediastinal contours are within normal limits. Both lungs are clear. The visualized skeletal structures are unremarkable. IMPRESSION: No active cardiopulmonary disease. Electronically Signed   By: Alcide Clever M.D.   On: 01/05/2017 11:48    Procedures Procedures (including critical care time)  Medications Ordered in ED Medications  albuterol (PROVENTIL) (2.5 MG/3ML) 0.083% nebulizer solution 5 mg (5 mg Nebulization Given 01/05/17 1121)  albuterol (PROVENTIL) (2.5 MG/3ML) 0.083% nebulizer solution 5 mg (5 mg Nebulization Given 01/05/17 1451)  ipratropium (ATROVENT) nebulizer solution 0.5 mg (0.5 mg Nebulization Given 01/05/17 1451)  ibuprofen  (ADVIL,MOTRIN) tablet 600 mg (600 mg Oral Given 01/05/17 1450)  predniSONE (DELTASONE) tablet 60 mg (60 mg Oral Given 01/05/17 1452)  albuterol (PROVENTIL HFA;VENTOLIN HFA) 108 (90 Base) MCG/ACT inhaler 2 puff (2 puffs Inhalation Given 01/05/17 1452)     Initial Impression / Assessment and Plan / ED Course  I have reviewed the triage vital signs and the nursing notes.  Pertinent labs & imaging results that were available during my care of the patient were reviewed by me and considered in my medical decision making (see chart for details).      Final Clinical Impressions(s) / ED Diagnoses   Final diagnoses:  Mild intermittent asthma with exacerbation  Viral URI with cough   Patient is nontoxic-appearing, afebrile, and in no acute distress.  Patient did not take antipyretics prior to arrival.  No concerning features such as visual or other neurologic changes with headache.  No meningeal signs.  Pulse rechecked by me in the emergency department demonstrates pulse rate of 98.  Patient with likely asthma exacerbation secondary to a viral illness.  Chest x-ray demonstrates no evidence of pneumonia.  Patient treated with 2 DuoNeb's in the emergency department and is breathing comfortably.  Ambulatory pulse ox 95-98% with no shortness of breath.  Patient to be discharged with albuterol inhaler, prednisone, Mucinex, and recommendations for PCP follow-up.  Return precautions given for any development of fevers, chest pain, shortness of breath.  Patient is in understanding and agrees with plan of care.  ED Discharge Orders        Ordered    dextromethorphan-guaiFENesin [facility] DM) 30-600 MG 12hr tablet  2 times daily     01/05/17 1510    ibuprofen (ADVIL,MOTRIN) 600 MG tablet  Every 6 hours PRN     01/05/17 1510    predniSONE (DELTASONE) 10 MG tablet     01/05/17 1510    Spacer/Aero Chamber Mouthpiece MISC  As needed     01/05/17 1510    albuterol (PROVENTIL HFA;VENTOLIN HFA) 108 (90 Base) MCG/ACT  inhaler  Every 6 hours PRN     01/05/17 1510       Elisha Ponder, PA-C 01/06/17 0004    Arby Barrette, MD 01/15/17 1435

## 2017-01-05 NOTE — ED Triage Notes (Signed)
Patient c/o a productive cough with clear/white sputum, sore throat, and generalized body aches x 2-3 days.

## 2017-01-05 NOTE — ED Notes (Signed)
Bed: WTR9 Expected date:  Expected time:  Means of arrival:  Comments: Hold - triage pt waiting for rm.

## 2017-01-17 NOTE — ED Notes (Signed)
Opened chart to verify pt does have a 10 day work note.

## 2017-02-04 ENCOUNTER — Encounter (HOSPITAL_COMMUNITY): Payer: Self-pay

## 2017-02-04 ENCOUNTER — Emergency Department (HOSPITAL_COMMUNITY)
Admission: EM | Admit: 2017-02-04 | Discharge: 2017-02-04 | Disposition: A | Discharge status: Home / Self Care | Payer: BLUE CROSS/BLUE SHIELD | Attending: Emergency Medicine | Admitting: Emergency Medicine

## 2017-02-04 DIAGNOSIS — R6883 Chills (without fever): Secondary | ICD-10-CM | POA: Insufficient documentation

## 2017-02-04 DIAGNOSIS — J45909 Unspecified asthma, uncomplicated: Secondary | ICD-10-CM | POA: Diagnosis not present

## 2017-02-04 DIAGNOSIS — Z79899 Other long term (current) drug therapy: Secondary | ICD-10-CM | POA: Diagnosis not present

## 2017-02-04 DIAGNOSIS — R112 Nausea with vomiting, unspecified: Secondary | ICD-10-CM | POA: Insufficient documentation

## 2017-02-04 DIAGNOSIS — F17210 Nicotine dependence, cigarettes, uncomplicated: Secondary | ICD-10-CM | POA: Diagnosis not present

## 2017-02-04 LAB — COMPREHENSIVE METABOLIC PANEL
ALBUMIN: 4 g/dL (ref 3.5–5.0)
ALK PHOS: 61 U/L (ref 38–126)
ALT: 17 U/L (ref 14–54)
AST: 22 U/L (ref 15–41)
Anion gap: 15 (ref 5–15)
BILIRUBIN TOTAL: 0.6 mg/dL (ref 0.3–1.2)
BUN: 13 mg/dL (ref 6–20)
CALCIUM: 9.3 mg/dL (ref 8.9–10.3)
CO2: 22 mmol/L (ref 22–32)
Chloride: 103 mmol/L (ref 101–111)
Creatinine, Ser: 0.82 mg/dL (ref 0.44–1.00)
GFR calc Af Amer: >60 mL/min (ref >60)
GFR calc non Af Amer: >60 mL/min (ref >60)
GLUCOSE: 119 mg/dL — AB (ref 65–99)
Potassium: 3.8 mmol/L (ref 3.5–5.1)
Sodium: 140 mmol/L (ref 135–145)
TOTAL PROTEIN: 7.2 g/dL (ref 6.5–8.1)

## 2017-02-04 LAB — URINALYSIS, ROUTINE W REFLEX MICROSCOPIC
Bilirubin Urine: NEGATIVE
GLUCOSE, UA: NEGATIVE mg/dL
HGB URINE DIPSTICK: NEGATIVE
Ketones, ur: NEGATIVE mg/dL
LEUKOCYTES UA: NEGATIVE
Nitrite: NEGATIVE
PROTEIN: NEGATIVE mg/dL
SPECIFIC GRAVITY, URINE: 1.003 — AB (ref 1.005–1.030)
pH: 8 (ref 5.0–8.0)

## 2017-02-04 LAB — CBC
HEMATOCRIT: 44.8 % (ref 36.0–46.0)
Hemoglobin: 15.3 g/dL — ABNORMAL HIGH (ref 12.0–15.0)
MCH: 28.2 pg (ref 26.0–34.0)
MCHC: 34.2 g/dL (ref 30.0–36.0)
MCV: 82.7 fL (ref 78.0–100.0)
Platelets: 439 10*3/uL — ABNORMAL HIGH (ref 150–400)
RBC: 5.42 MIL/uL — ABNORMAL HIGH (ref 3.87–5.11)
RDW: 16 % — ABNORMAL HIGH (ref 11.5–15.5)
WBC: 10.3 10*3/uL (ref 4.0–10.5)

## 2017-02-04 LAB — I-STAT BETA HCG BLOOD, ED (MC, WL, AP ONLY)

## 2017-02-04 LAB — LIPASE, BLOOD: Lipase: 24 U/L (ref 11–51)

## 2017-02-04 MED ORDER — SODIUM CHLORIDE 0.9 % IV BOLUS (SEPSIS)
1000.0000 mL | Freq: Once | INTRAVENOUS | Status: AC
  Administered 2017-02-04: 1000 mL via INTRAVENOUS

## 2017-02-04 MED ORDER — ONDANSETRON 4 MG PO TBDP: 4.0000 mg | ORAL_TABLET | Freq: Three times a day (TID) | ORAL | 0 refills | Status: DC | PRN

## 2017-02-04 MED ORDER — ONDANSETRON 4 MG PO TBDP
4.0000 mg | ORAL_TABLET | Freq: Once | ORAL | Status: AC | PRN
  Administered 2017-02-04: 4 mg via ORAL
  Filled 2017-02-04: qty 1

## 2017-02-04 MED ORDER — ONDANSETRON HCL 4 MG/2ML IJ SOLN
4.0000 mg | Freq: Once | INTRAMUSCULAR | Status: AC
  Administered 2017-02-04: 4 mg via INTRAVENOUS
  Filled 2017-02-04: qty 2

## 2017-02-04 NOTE — ED Notes (Signed)
Attempted to collect urine sample, patent aware of need for urine.

## 2017-02-04 NOTE — Discharge Instructions (Signed)
Please drink plenty of fluids to stay hydrated Take Zofran for nausea Return if worsening

## 2017-02-04 NOTE — ED Provider Notes (Signed)
[facility] EMERGENCY DEPARTMENT Provider Note   CSN: [id number] Arrival date & time: 02/04/17  1100     History   Chief Complaint Chief Complaint  Patient presents with  . Abdominal Pain  . Emesis    HPI Tamara Bryant is a 27 y.o. female who presents with emesis. PMH of asthma and obesity. She states that she drank "a lot" of alcohol last night. She started to vomit before she went home. She went home and went to sleep. When she woke up this morning she continued to vomit. She has not been able to hold anything down. She denies fever but has chills. She denies chest pain, SOB, abdominal pain, diarrhea, urinary symptoms, vaginal discharge. She received Zofran in triage which has helped. No prior abdominal surgeries.  HPI  Past Medical History:  Diagnosis Date  . Asthma   . Obesity     There are no active problems to display for this patient.   Past Surgical History:  Procedure Laterality Date  . WISDOM TOOTH EXTRACTION      OB History    Gravida Para Term Preterm AB Living   0             SAB TAB Ectopic Multiple Live Births                   Home Medications    Prior to Admission medications   Medication Sig Start Date End Date Taking? Authorizing Provider  albuterol (PROVENTIL HFA;VENTOLIN HFA) 108 (90 Base) MCG/ACT inhaler Inhale 1-2 puffs into the lungs every 6 (six) hours as needed for wheezing or shortness of breath. 01/05/17   Aviva Kluver B, PA-C  benzonatate (TESSALON) 100 MG capsule Take 1 capsule (100 mg total) every 8 (eight) hours by mouth. Patient not taking: Reported on 12/16/2016 11/13/16   Hedges, Tinnie Gens, PA-C  cephALEXin [facility]) 500 MG capsule 2 caps po bid x 7 days Patient not taking: Reported on 01/05/2017 12/16/16   Arthor Captain, PA-C  cetirizine (ZYRTEC) 10 MG tablet Take 1 tablet (10 mg total) by mouth daily. Patient not taking: Reported on 04/30/2016 04/19/16   Joycie Peek, PA-C  ibuprofen (ADVIL,MOTRIN) 600 MG  tablet Take 1 tablet (600 mg total) by mouth every 6 (six) hours as needed. 01/05/17   Aviva Kluver B, PA-C  ipratropium (ATROVENT) 0.06 % nasal spray Place 2 sprays into both nostrils 4 (four) times daily. Patient not taking: Reported on 04/30/2016 04/19/16   Joycie Peek, PA-C  ketorolac (TORADOL) 10 MG tablet Take 1 tablet (10 mg total) by mouth every 6 (six) hours as needed. Patient not taking: Reported on 12/16/2016 04/30/16   Jacalyn Lefevre, MD  loperamide (IMODIUM) 2 MG capsule Take 1 capsule (2 mg total) by mouth 4 (four) times daily as needed for diarrhea or loose stools. Patient not taking: Reported on 04/06/2016 12/05/15   Horton, Mayer Masker, MD  loratadine (CLARITIN) 10 MG tablet Take 1 tablet (10 mg total) by mouth daily. Patient not taking: Reported on 12/16/2016 04/30/16   Jacalyn Lefevre, MD  oxymetazoline [facility] NASAL SPRAY) 0.05 % nasal spray Place 1 spray into both nostrils 2 (two) times daily. Apply 1 spray to both nostrils twice daily for the next 3 days. Do not use for longer than 3 days to prevent rebound rhinorrhea. Patient not taking: Reported on 04/06/2016 09/24/15   [name], PA-C  promethazine (PHENERGAN) 25 MG tablet Take 1 tablet (25 mg total) by mouth every 6 (  six) hours as needed for nausea or vomiting. Patient not taking: Reported on 04/06/2016 09/25/15   Pisciotta, Joni ReiningNicole, PA-C  Spacer/Aero Chamber Mouthpiece MISC 1 application by Does not apply route as needed. 01/05/17   Aviva KluverMurray, Alyssa B, PA-C  traMADol (ULTRAM) 50 MG tablet Take 1 tablet (50 mg total) by mouth every 6 (six) hours as needed. Patient not taking: Reported on 04/06/2016 05/30/15   Gilda CreasePollina, Christopher J, MD    Family History Family History  Problem Relation Age of Onset  . Hypertension Mother   . Sickle cell anemia Mother   . Diabetes Father   . Hypertension Father   . Diabetes Maternal Grandmother   . Diabetes Paternal Grandfather     Social History Social History   Tobacco Use    . Smoking status: Current Every Day Smoker    Packs/day: 1.00    Types: E-cigarettes  . Smokeless tobacco: Never Used  Substance Use Topics  . Alcohol use: Yes    Comment: Occasional   . Drug use: No     Allergies   Patient has no known allergies.   Review of Systems Review of Systems  Constitutional: Positive for chills. Negative for fever.  Respiratory: Negative for shortness of breath.   Cardiovascular: Negative for chest pain.  Gastrointestinal: Positive for nausea and vomiting. Negative for abdominal pain and diarrhea.  Genitourinary: Negative for dysuria and frequency.     Physical Exam Updated Vital Signs BP (!) 127/97   Pulse 93   Temp 98.2 F (36.8 C) (Oral)   Resp 18   SpO2 100%   Physical Exam  Constitutional: She is oriented to person, place, and time. She appears well-developed and well-nourished. No distress.  Obese, sleeping  HENT:  Head: Normocephalic and atraumatic.  Eyes: Conjunctivae are normal. Pupils are equal, round, and reactive to light. Right eye exhibits no discharge. Left eye exhibits no discharge. No scleral icterus.  Neck: Normal range of motion.  Cardiovascular: Normal rate and regular rhythm. Exam reveals no gallop and no friction rub.  No murmur heard. Tachycardic with sitting up. Normal HR at rest  Pulmonary/Chest: Effort normal and breath sounds normal. No respiratory distress.  Abdominal: Soft. She exhibits no distension. There is no tenderness.  Neurological: She is alert and oriented to person, place, and time.  Skin: Skin is warm and dry.  Psychiatric: She has a normal mood and affect. Her behavior is normal.  Nursing note and vitals reviewed.    ED Treatments / Results  Labs (all labs ordered are listed, but only abnormal results are displayed) Labs Reviewed  COMPREHENSIVE METABOLIC PANEL - Abnormal; Notable for the following components:      Result Value   Glucose, Bld 119 (*)    All other components within normal  limits  CBC - Abnormal; Notable for the following components:   RBC 5.42 (*)    Hemoglobin 15.3 (*)    RDW 16.0 (*)    Platelets 439 (*)    All other components within normal limits  URINALYSIS, ROUTINE W REFLEX MICROSCOPIC - Abnormal; Notable for the following components:   Color, Urine STRAW (*)    Specific Gravity, Urine 1.003 (*)    All other components within normal limits  LIPASE, BLOOD  I-STAT BETA HCG BLOOD, ED ([facility], [facility], [facility] ONLY)    EKG  EKG Interpretation None       Radiology No results found.  Procedures Procedures (including critical care time)  Medications Ordered in ED Medications  ondansetron (ZOFRAN-ODT) disintegrating tablet 4 mg (4 mg Oral Given 02/04/17 1147)  sodium chloride 0.9 % bolus 1,000 mL (0 mLs Intravenous Stopped 02/04/17 1511)  ondansetron (ZOFRAN) injection 4 mg (4 mg Intravenous Given 02/04/17 1413)     Initial Impression / Assessment and Plan / ED Course  I have reviewed the triage vital signs and the nursing notes.  Pertinent labs & imaging results that were available during my care of the patient were reviewed by me and considered in my medical decision making (see chart for details).  27 year old female presents with multiple episodes of emesis after heavily drinking alcohol last night. She is tachycardic with positional changes but otherwise vitals are normal. Abdomen is non-tender. Labs show some evidence of hemoconcentration. She was offered IV fluids which she would like to do.   3:24 PM IVF finished. She has tolerated PO. She will be given rx for Zofran and return precautions.   Final Clinical Impressions(s) / ED Diagnoses   Final diagnoses:  Non-intractable vomiting with nausea, unspecified vomiting type    ED Discharge Orders    None       Bethel Born, PA-C 02/04/17 1525    Loren Racer, MD 02/08/17 [phone]

## 2017-02-04 NOTE — ED Notes (Signed)
Patient vomiting on to floor in lobby.  Had emesis bag beside her.  Provided her another one and asked her to use that.  Patient vomited on to floor beside bag.  Floor cleaned up.  Will bring patient back for triage assessment

## 2017-02-04 NOTE — ED Triage Notes (Signed)
Patient complains of nausea and vomiting since last night. Denies diarrhea. Reports that it started after having a few drinks. Vomited x 2 on arrival/ alert and oriented

## 2017-07-07 ENCOUNTER — Encounter (HOSPITAL_COMMUNITY): Payer: Self-pay

## 2017-07-07 ENCOUNTER — Inpatient Hospital Stay (HOSPITAL_COMMUNITY)
Admission: AD | Admit: 2017-07-07 | Discharge: 2017-07-07 | Disposition: A | Discharge status: Home / Self Care | Payer: BLUE CROSS/BLUE SHIELD | Source: Ambulatory Visit | Attending: Obstetrics and Gynecology | Admitting: Obstetrics and Gynecology

## 2017-07-07 DIAGNOSIS — F17290 Nicotine dependence, other tobacco product, uncomplicated: Secondary | ICD-10-CM | POA: Diagnosis not present

## 2017-07-07 DIAGNOSIS — A599 Trichomoniasis, unspecified: Secondary | ICD-10-CM

## 2017-07-07 DIAGNOSIS — Z3490 Encounter for supervision of normal pregnancy, unspecified, unspecified trimester: Secondary | ICD-10-CM | POA: Diagnosis not present

## 2017-07-07 DIAGNOSIS — A5901 Trichomonal vulvovaginitis: Secondary | ICD-10-CM | POA: Insufficient documentation

## 2017-07-07 DIAGNOSIS — R109 Unspecified abdominal pain: Secondary | ICD-10-CM | POA: Diagnosis present

## 2017-07-07 HISTORY — DX: Other seasonal allergic rhinitis: J30.2

## 2017-07-07 LAB — RAPID URINE DRUG SCREEN, HOSP PERFORMED
AMPHETAMINES: NOT DETECTED
Benzodiazepines: NOT DETECTED
COCAINE: NOT DETECTED
Opiates: NOT DETECTED
TETRAHYDROCANNABINOL: POSITIVE — AB

## 2017-07-07 LAB — URINALYSIS, ROUTINE W REFLEX MICROSCOPIC
Bacteria, UA: NONE SEEN
Bilirubin Urine: NEGATIVE
GLUCOSE, UA: NEGATIVE mg/dL
HGB URINE DIPSTICK: NEGATIVE
Ketones, ur: NEGATIVE mg/dL
NITRITE: NEGATIVE
PH: 5 (ref 5.0–8.0)
PROTEIN: NEGATIVE mg/dL
Specific Gravity, Urine: 1.024 (ref 1.005–1.030)

## 2017-07-07 LAB — WET PREP, GENITAL
Sperm: NONE SEEN
Yeast Wet Prep HPF POC: NONE SEEN

## 2017-07-07 LAB — POCT PREGNANCY, URINE: Preg Test, Ur: POSITIVE — AB

## 2017-07-07 LAB — HCG, QUANTITATIVE, PREGNANCY: hCG, Beta Chain, Quant, S: 10653 m[IU]/mL — ABNORMAL HIGH (ref <5)

## 2017-07-07 MED ORDER — METRONIDAZOLE 500 MG PO TABS: 500.0000 mg | ORAL_TABLET | Freq: Two times a day (BID) | ORAL | 0 refills | Status: DC

## 2017-07-07 NOTE — Discharge Instructions (Signed)
Trichomoniasis °Trichomoniasis is an STI (sexually transmitted infection) that can affect both women and men. In women, the outer area of the female genitalia (vulva) and the vagina are affected. In men, the penis is mainly affected, but the prostate and other reproductive organs can also be involved. This condition can be treated with medicine. It often has no symptoms (is asymptomatic), especially in men. °What are the causes? °This condition is caused by an organism called Trichomonas vaginalis. Trichomoniasis most often spreads from person to person (is contagious) through sexual contact. °What increases the risk? °The following factors may make you more likely to develop this condition: °· Having unprotected sexual intercourse. °· Having sexual intercourse with a partner who has trichomoniasis. °· Having multiple sexual partners. °· Having had previous trichomoniasis infections or other STIs. ° °What are the signs or symptoms? °In women, symptoms of trichomoniasis include: °· Abnormal vaginal discharge that is clear, white, gray, or yellow-green and foamy and has an unusual "fishy" odor. °· Itching and irritation of the vagina and vulva. °· Burning or pain during urination or sexual intercourse. °· Genital redness and swelling. ° °In men, symptoms of trichomoniasis include: °· Penile discharge that may be foamy or contain pus. °· Pain in the penis. This may happen only when urinating. °· Itching or irritation inside the penis. °· Burning after urination or ejaculation. ° °How is this diagnosed? °In women, this condition may be found during a routine Pap test or physical exam. It may be found in men during a routine physical exam. Your health care provider may perform tests to help diagnose this infection, such as: °· Urine tests (men and women). °· The following in women: °? Testing the pH of the vagina. °? A vaginal swab test that checks for the Trichomonas vaginalis organism. °? Testing vaginal  secretions. ° °Your health care provider may test you for other STIs, including HIV (human immunodeficiency virus). °How is this treated? °This condition is treated with medicine taken by mouth (orally), such as metronidazole or tinidazole to fight the infection. Your sexual partner(s) may also need to be tested and treated. °· If you are a woman and you plan to become pregnant or think you may be pregnant, tell your health care provider right away. Some medicines that are used to treat the infection should not be taken during pregnancy. ° °Your health care provider may recommend over-the-counter medicines or creams to help relieve itching or irritation. You may be tested for infection again 3 months after treatment. °Follow these instructions at home: °· Take and use over-the-counter and prescription medicines, including creams, only as told by your health care provider. °· Do not have sexual intercourse until one week after you finish your medicine, or until your health care provider approves. Ask your health care provider when you may resume sexual intercourse. °· (Women) Do not douche or wear tampons while you have the infection. °· Discuss your infection with your sexual partner(s). Make sure that your partner gets tested and treated, if necessary. °· Keep all follow-up visits as told by your health care provider. This is important. °How is this prevented? °· Use condoms every time you have sex. Using condoms correctly and consistently can help protect against STIs. °· Avoid having multiple sexual partners. °· Talk with your sexual partner about any symptoms that either of you may have, as well as any history of STIs. °· Get tested for STIs and STDs (sexually transmitted diseases) before you have sex. Ask your partner   to do the same.  Do not have sexual contact if you have symptoms of trichomoniasis or another STI. Contact a health care provider if:  You still have symptoms after you finish your  medicine.  You develop pain in your abdomen.  You have pain when you urinate.  You have bleeding after sexual intercourse.  You develop a rash.  You feel nauseous or you vomit.  You plan to become pregnant or think you may be pregnant. Summary  Trichomoniasis is an STI (sexually transmitted infection) that can affect both women and men.  This condition often has no symptoms (is asymptomatic), especially in men.  You should not have sexual intercourse until one week after you finish your medicine, or until your health care provider approves. Ask your health care provider when you may resume sexual intercourse.  Discuss your infection with your sexual partner. Make sure that your partner gets tested and treated, if necessary. This information is not intended to replace advice given to you by your health care provider. Make sure you discuss any questions you have with your health care provider. Document Released: 06/14/2000 Document Revised: 11/12/2015 Document Reviewed: 11/12/2015 Elsevier Interactive Patient Education  2017 [employer] First Trimester of Pregnancy The first trimester of pregnancy is from week 1 until the end of week 13 (months 1 through 3). A week after a sperm fertilizes an egg, the egg will implant on the wall of the uterus. This embryo will begin to develop into a baby. Genes from you and your partner will form the baby. The female genes will determine whether the baby will be a boy or a girl. At 6-8 weeks, the eyes and face will be formed, and the heartbeat can be seen on ultrasound. At the end of 12 weeks, all the baby's organs will be formed. Now that you are pregnant, you will want to do everything you can to have a healthy baby. Two of the most important things are to get good prenatal care and to follow your health care provider's instructions. Prenatal care is all the medical care you receive before the baby's birth. This care will help prevent, find, and treat  any problems during the pregnancy and childbirth. Body changes during your first trimester Your body goes through many changes during pregnancy. The changes vary from woman to woman.  You may gain or lose a couple of pounds at first.  You may feel sick to your stomach (nauseous) and you may throw up (vomit). If the vomiting is uncontrollable, call your health care provider.  You may tire easily.  You may develop headaches that can be relieved by medicines. All medicines should be approved by your health care provider.  You may urinate more often. Painful urination may mean you have a bladder infection.  You may develop heartburn as a result of your pregnancy.  You may develop constipation because certain hormones are causing the muscles that push stool through your intestines to slow down.  You may develop hemorrhoids or swollen veins (varicose veins).  Your breasts may begin to grow larger and become tender. Your nipples may stick out more, and the tissue that surrounds them (areola) may become darker.  Your gums may bleed and may be sensitive to brushing and flossing.  Dark spots or blotches (chloasma, mask of pregnancy) may develop on your face. This will likely fade after the baby is born.  Your menstrual periods will stop.  You may have a loss of appetite.  You  may develop cravings for certain kinds of food.  You may have changes in your emotions from day to day, such as being excited to be pregnant or being concerned that something may go wrong with the pregnancy and baby.  You may have more vivid and strange dreams.  You may have changes in your hair. These can include thickening of your hair, rapid growth, and changes in texture. Some women also have hair loss during or after pregnancy, or hair that feels dry or thin. Your hair will most likely return to normal after your baby is born.  What to expect at prenatal visits During a routine prenatal visit:  You will be  weighed to make sure you and the baby are growing normally.  Your blood pressure will be taken.  Your abdomen will be measured to track your baby's growth.  The fetal heartbeat will be listened to between weeks 10 and 14 of your pregnancy.  Test results from any previous visits will be discussed.  Your health care provider may ask you:  How you are feeling.  If you are feeling the baby move.  If you have had any abnormal symptoms, such as leaking fluid, bleeding, severe headaches, or abdominal cramping.  If you are using any tobacco products, including cigarettes, chewing tobacco, and electronic cigarettes.  If you have any questions.  Other tests that may be performed during your first trimester include:  Blood tests to find your blood type and to check for the presence of any previous infections. The tests will also be used to check for low iron levels (anemia) and protein on red blood cells (Rh antibodies). Depending on your risk factors, or if you previously had diabetes during pregnancy, you may have tests to check for high blood sugar that affects pregnant women (gestational diabetes).  Urine tests to check for infections, diabetes, or protein in the urine.  An ultrasound to confirm the proper growth and development of the baby.  Fetal screens for spinal cord problems (spina bifida) and Down syndrome.  HIV (human immunodeficiency virus) testing. Routine prenatal testing includes screening for HIV, unless you choose not to have this test.  You may need other tests to make sure you and the baby are doing well.  Follow these instructions at home: Medicines  Follow your health care provider's instructions regarding medicine use. Specific medicines may be either safe or unsafe to take during pregnancy.  Take a prenatal vitamin that contains at least 600 micrograms (mcg) of folic acid.  If you develop constipation, try taking a stool softener if your health care provider  approves. Eating and drinking  Eat a balanced diet that includes fresh fruits and vegetables, whole grains, good sources of protein such as meat, eggs, or tofu, and low-fat dairy. Your health care provider will help you determine the amount of weight gain that is right for you.  Avoid raw meat and uncooked cheese. These carry germs that can cause birth defects in the baby.  Eating four or five small meals rather than three large meals a day may help relieve nausea and vomiting. If you start to feel nauseous, eating a few soda crackers can be helpful. Drinking liquids between meals, instead of during meals, also seems to help ease nausea and vomiting.  Limit foods that are high in fat and processed sugars, such as fried and sweet foods.  To prevent constipation: ? Eat foods that are high in fiber, such as fresh fruits and vegetables, whole grains,  and beans. ? Drink enough fluid to keep your urine clear or pale yellow. Activity  Exercise only as directed by your health care provider. Most women can continue their usual exercise routine during pregnancy. Try to exercise for 30 minutes at least 5 days a week. Exercising will help you: ? Control your weight. ? Stay in shape. ? Be prepared for labor and delivery.  Experiencing pain or cramping in the lower abdomen or lower back is a good sign that you should stop exercising. Check with your health care provider before continuing with normal exercises.  Try to avoid standing for long periods of time. Move your legs often if you must stand in one place for a long time.  Avoid heavy lifting.  Wear low-heeled shoes and practice good posture.  You may continue to have sex unless your health care provider tells you not to. Relieving pain and discomfort  Wear a good support bra to relieve breast tenderness.  Take warm sitz baths to soothe any pain or discomfort caused by hemorrhoids. Use hemorrhoid cream if your health care provider  approves.  Rest with your legs elevated if you have leg cramps or low back pain.  If you develop varicose veins in your legs, wear support hose. Elevate your feet for 15 minutes, 3-4 times a day. Limit salt in your diet. Prenatal care  Schedule your prenatal visits by the twelfth week of pregnancy. They are usually scheduled monthly at first, then more often in the last 2 months before delivery.  Write down your questions. Take them to your prenatal visits.  Keep all your prenatal visits as told by your health care provider. This is important. Safety  Wear your seat belt at all times when driving.  Make a list of emergency phone numbers, including numbers for family, friends, the hospital, and police and fire departments. General instructions  Ask your health care provider for a referral to a local prenatal education class. Begin classes no later than the beginning of month 6 of your pregnancy.  Ask for help if you have counseling or nutritional needs during pregnancy. Your health care provider can offer advice or refer you to specialists for help with various needs.  Do not use hot tubs, steam rooms, or saunas.  Do not douche or use tampons or scented sanitary pads.  Do not cross your legs for long periods of time.  Avoid cat litter boxes and soil used by cats. These carry germs that can cause birth defects in the baby and possibly loss of the fetus by miscarriage or stillbirth.  Avoid all smoking, herbs, alcohol, and medicines not prescribed by your health care provider. Chemicals in these products affect the formation and growth of the baby.  Do not use any products that contain nicotine or tobacco, such as cigarettes and e-cigarettes. If you need help quitting, ask your health care provider. You may receive counseling support and other resources to help you quit.  Schedule a dentist appointment. At home, brush your teeth with a soft toothbrush and be gentle when you  floss. Contact a health care provider if:  You have dizziness.  You have mild pelvic cramps, pelvic pressure, or nagging pain in the abdominal area.  You have persistent nausea, vomiting, or diarrhea.  You have a bad smelling vaginal discharge.  You have pain when you urinate.  You notice increased swelling in your face, hands, legs, or ankles.  You are exposed to fifth disease or chickenpox.  You are  exposed to [place] measles (rubella) and have never had it. Get help right away if:  You have a fever.  You are leaking fluid from your vagina.  You have spotting or bleeding from your vagina.  You have severe abdominal cramping or pain.  You have rapid weight gain or loss.  You vomit blood or material that looks like coffee grounds.  You develop a severe headache.  You have shortness of breath.  You have any kind of trauma, such as from a fall or a car accident. Summary  The first trimester of pregnancy is from week 1 until the end of week 13 (months 1 through 3).  Your body goes through many changes during pregnancy. The changes vary from woman to woman.  You will have routine prenatal visits. During those visits, your health care provider will examine you, discuss any test results you may have, and talk with you about how you are feeling. This information is not intended to replace advice given to you by your health care provider. Make sure you discuss any questions you have with your health care provider. Document Released: 12/13/2000 Document Revised: 12/01/2015 Document Reviewed: 12/01/2015 Elsevier Interactive Patient Education  [employer].

## 2017-07-07 NOTE — MAU Note (Signed)
Pt reports she would like preg confirmation. Had spotting a couple of days, last time was 06/30. Reports intermittent cramping, last cramping was this AM.

## 2017-07-07 NOTE — MAU Provider Note (Signed)
History   pt is in today for preg conformation. States abd pain this morning but none at present. Pt is loud laughing in room. Joking with friends denies pain at this time. Pt is so loud she had to be called down by nursing staff.   CSN: [id number]  Arrival date & time 07/07/17  1157   None     Chief Complaint  Patient presents with  . Possible Pregnancy    HPI  Past Medical History:  Diagnosis Date  . Asthma   . Obesity   . Seasonal allergies     Past Surgical History:  Procedure Laterality Date  . WISDOM TOOTH EXTRACTION      Family History  Problem Relation Age of Onset  . Hypertension Mother   . Sickle cell anemia Mother   . Diabetes Father   . Hypertension Father   . Diabetes Maternal Grandmother   . Diabetes Paternal Grandfather     Social History   Tobacco Use  . Smoking status: Current Every Day Smoker    Packs/day: 1.00    Types: E-cigarettes  . Smokeless tobacco: Never Used  Substance Use Topics  . Alcohol use: Yes    Comment: last 07/05  . Drug use: Yes    Types: Marijuana    Comment: last 07/05    OB History    Gravida  1   Para      Term      Preterm      AB      Living        SAB      TAB      Ectopic      Multiple      Live Births              Review of Systems  Constitutional: Negative.   HENT: Negative.   Eyes: Negative.   Respiratory: Negative.   Cardiovascular: Negative.   Gastrointestinal: Negative.   Endocrine: Negative.   Genitourinary: Negative.   Musculoskeletal: Negative.   Skin: Negative.   Allergic/Immunologic: Negative.   Neurological: Negative.   Hematological: Negative.   Psychiatric/Behavioral: Negative.     Allergies  Patient has no known allergies.  Home Medications    BP 121/78 (BP Location: Right Arm)   Pulse 86   Temp (!) 97.4 F (36.3 C) (Oral)   Resp 18   Ht 5\' 6"  (1.676 m)   Wt 246 lb (111.6 kg)   LMP 05/24/2017   BMI 39.71 kg/m   Physical Exam  Constitutional: She  is oriented to person, place, and time. She appears well-developed and well-nourished.  HENT:  Head: Normocephalic.  Neck: Normal range of motion.  Cardiovascular: Normal rate, regular rhythm, normal heart sounds and intact distal pulses.  Pulmonary/Chest: Effort normal and breath sounds normal.  Abdominal: Soft. Bowel sounds are normal.  Genitourinary: Vagina normal and uterus normal.  Musculoskeletal: Normal range of motion.  Neurological: She is alert and oriented to person, place, and time.  Skin: Skin is warm and dry.  Psychiatric: She has a normal mood and affect. Her behavior is normal. Judgment and thought content normal.    MAU Course  Procedures (including critical care time)  Labs Reviewed  URINALYSIS, ROUTINE W REFLEX MICROSCOPIC - Abnormal; Notable for the following components:      Result Value   Leukocytes, UA SMALL (*)    All other components within normal limits  POCT PREGNANCY, URINE - Abnormal; Notable for the following components:  Preg Test, Ur POSITIVE (*)    All other components within normal limits  HCG, QUANTITATIVE, PREGNANCY   No results found.   Trich    MDM  VSS, stable condition. Will treat for trich. F/u in clinic Monday for repeat quant. Will d/c home in stable condition.

## 2017-07-09 ENCOUNTER — Encounter: Payer: Self-pay | Admitting: General Practice

## 2017-07-09 ENCOUNTER — Other Ambulatory Visit: Payer: Self-pay | Admitting: General Practice

## 2017-07-09 DIAGNOSIS — O219 Vomiting of pregnancy, unspecified: Secondary | ICD-10-CM

## 2017-07-09 LAB — GC/CHLAMYDIA PROBE AMP (~~LOC~~) NOT AT ARMC
CHLAMYDIA, DNA PROBE: NEGATIVE
Neisseria Gonorrhea: NEGATIVE

## 2017-07-09 MED ORDER — PROMETHAZINE HCL 25 MG PO TABS: ORAL_TABLET | ORAL | 0 refills | Status: DC

## 2017-07-09 NOTE — Progress Notes (Unsigned)
Patient came by office stating she was told she needed her bhcg levels rechecked. Discussed with Dr Macon LargeAnyanwu- bhcg not indicated as patient was not and is not symptomatic today, patient can begin prenatal care. Discussed with patient & reviewed dating based off regular LMP. Patient verbalized understanding & had no questions.

## 2017-07-10 ENCOUNTER — Inpatient Hospital Stay (HOSPITAL_COMMUNITY): Payer: BLUE CROSS/BLUE SHIELD

## 2017-07-10 ENCOUNTER — Other Ambulatory Visit: Payer: Self-pay

## 2017-07-10 ENCOUNTER — Encounter (HOSPITAL_COMMUNITY): Payer: Self-pay

## 2017-07-10 ENCOUNTER — Inpatient Hospital Stay (HOSPITAL_COMMUNITY)
Admission: AD | Admit: 2017-07-10 | Discharge: 2017-07-10 | Disposition: A | Discharge status: Home / Self Care | Payer: BLUE CROSS/BLUE SHIELD | Source: Ambulatory Visit | Attending: Family Medicine | Admitting: Family Medicine

## 2017-07-10 DIAGNOSIS — R109 Unspecified abdominal pain: Secondary | ICD-10-CM | POA: Diagnosis not present

## 2017-07-10 DIAGNOSIS — O26891 Other specified pregnancy related conditions, first trimester: Secondary | ICD-10-CM | POA: Diagnosis not present

## 2017-07-10 DIAGNOSIS — O210 Mild hyperemesis gravidarum: Secondary | ICD-10-CM | POA: Insufficient documentation

## 2017-07-10 DIAGNOSIS — Z3A01 Less than 8 weeks gestation of pregnancy: Secondary | ICD-10-CM | POA: Diagnosis not present

## 2017-07-10 DIAGNOSIS — R197 Diarrhea, unspecified: Secondary | ICD-10-CM | POA: Diagnosis not present

## 2017-07-10 DIAGNOSIS — O99331 Smoking (tobacco) complicating pregnancy, first trimester: Secondary | ICD-10-CM | POA: Insufficient documentation

## 2017-07-10 DIAGNOSIS — O219 Vomiting of pregnancy, unspecified: Secondary | ICD-10-CM

## 2017-07-10 DIAGNOSIS — O9989 Other specified diseases and conditions complicating pregnancy, childbirth and the puerperium: Secondary | ICD-10-CM | POA: Diagnosis not present

## 2017-07-10 DIAGNOSIS — F17210 Nicotine dependence, cigarettes, uncomplicated: Secondary | ICD-10-CM | POA: Diagnosis not present

## 2017-07-10 DIAGNOSIS — O26899 Other specified pregnancy related conditions, unspecified trimester: Secondary | ICD-10-CM

## 2017-07-10 LAB — URINALYSIS, ROUTINE W REFLEX MICROSCOPIC
BILIRUBIN URINE: NEGATIVE
GLUCOSE, UA: NEGATIVE mg/dL
Hgb urine dipstick: NEGATIVE
KETONES UR: NEGATIVE mg/dL
LEUKOCYTES UA: NEGATIVE
NITRITE: NEGATIVE
PROTEIN: NEGATIVE mg/dL
Specific Gravity, Urine: 1.017 (ref 1.005–1.030)
pH: 8 (ref 5.0–8.0)

## 2017-07-10 MED ORDER — ONDANSETRON 8 MG PO TBDP
8.0000 mg | ORAL_TABLET | Freq: Once | ORAL | Status: AC
  Administered 2017-07-10: 8 mg via ORAL
  Filled 2017-07-10: qty 1

## 2017-07-10 MED ORDER — ONDANSETRON HCL 4 MG PO TABS: 4.0000 mg | ORAL_TABLET | Freq: Every day | ORAL | 0 refills | Status: DC | PRN

## 2017-07-10 NOTE — Discharge Instructions (Signed)

## 2017-07-10 NOTE — MAU Provider Note (Signed)
History     CSN: [id number]  Arrival date and time: 07/10/17 1051   First Provider Initiated Contact with Patient 07/10/17 1134      Chief Complaint  Patient presents with  . Emesis  . Nausea    Ms.Tamara Bryant is a 27 y.o. female G1P0 @ 847w5d here in MAU with diarrhea and N/V. Symptoms started yesterday. She has not taken any medications for the symptoms. She has had 5 episodes of liquid diarrhea, and 2 episodes of vomiting. Eating makes the symptoms worse. She is able to keep down fluids, however not solids.   Emesis   This is a new problem. The current episode started yesterday. The problem occurs 2 to 4 times per day. The problem has been unchanged. There has been no fever. Associated symptoms include abdominal pain and diarrhea. Pertinent negatives include no fever or headaches. She has tried nothing for the symptoms.  Diarrhea   Associated symptoms include abdominal pain and vomiting. Pertinent negatives include no fever or headaches.    OB History    Gravida  1   Para      Term      Preterm      AB      Living        SAB      TAB      Ectopic      Multiple      Live Births              Past Medical History:  Diagnosis Date  . Asthma   . Obesity   . Seasonal allergies     Past Surgical History:  Procedure Laterality Date  . WISDOM TOOTH EXTRACTION      Family History  Problem Relation Age of Onset  . Hypertension Mother   . Sickle cell anemia Mother   . Diabetes Father   . Hypertension Father   . Diabetes Maternal Grandmother   . Diabetes Paternal Grandfather     Social History   Tobacco Use  . Smoking status: Current Every Day Smoker    Packs/day: 1.00  . Smokeless tobacco: Never Used  . Tobacco comment: 07/06/2017  Substance Use Topics  . Alcohol use: Yes    Comment: last 07/05  . Drug use: Yes    Types: Marijuana    Comment: last 07/07/2017    Allergies: No Known Allergies  Medications Prior to Admission  Medication  Sig Dispense Refill Last Dose  . benzonatate (TESSALON) 100 MG capsule Take 1 capsule (100 mg total) every 8 (eight) hours by mouth. (Patient not taking: Reported on 12/16/2016) 21 capsule 0 Not Taking at Unknown time  . cetirizine (ZYRTEC) 10 MG tablet Take 1 tablet (10 mg total) by mouth daily. (Patient not taking: Reported on 04/30/2016) 30 tablet 0 Not Taking at Unknown time  . ipratropium (ATROVENT) 0.06 % nasal spray Place 2 sprays into both nostrils 4 (four) times daily. (Patient not taking: Reported on 04/30/2016) 15 mL 12 Not Taking at Unknown time  . loratadine (CLARITIN) 10 MG tablet Take 1 tablet (10 mg total) by mouth daily. (Patient not taking: Reported on 12/16/2016) 30 tablet 0 Not Taking at Unknown time  . metroNIDAZOLE (FLAGYL) 500 MG tablet Take 1 tablet (500 mg total) by mouth 2 (two) times daily. 14 tablet 0   . promethazine (PHENERGAN) 25 MG tablet 1/2 tab to 1 tablet every 6 hours PRN 30 tablet 0    Results for orders placed or performed  during the hospital encounter of 07/10/17 (from the past 48 hour(s))  Urinalysis, Routine w reflex microscopic     Status: Abnormal   Collection Time: 07/10/17 11:37 AM  Result Value Ref Range   Color, Urine YELLOW YELLOW   APPearance CLOUDY (A) CLEAR   Specific Gravity, Urine 1.017 1.005 - 1.030   pH 8.0 5.0 - 8.0   Glucose, UA NEGATIVE NEGATIVE mg/dL   Hgb urine dipstick NEGATIVE NEGATIVE   Bilirubin Urine NEGATIVE NEGATIVE   Ketones, ur NEGATIVE NEGATIVE mg/dL   Protein, ur NEGATIVE NEGATIVE mg/dL   Nitrite NEGATIVE NEGATIVE   Leukocytes, UA NEGATIVE NEGATIVE    Comment: Performed at [facility] [address] [place], [place] [address]   US Ob Less Than 14 Weeks With Ob Transvaginal  Result Date: 07/10/2017 CLINICAL DATA:  Abdominal discomfort with vomiting. EXAM: OBSTETRIC <14 WK [place] AND TRANSVAGINAL OB US TECHNIQUE: Both transabdominal and transvaginal ultrasound examinations were performed for complete evaluation of the  gestation as well as the maternal uterus, adnexal regions, and pelvic cul-de-sac. Transvaginal technique was performed to assess early pregnancy. COMPARISON:  None. FINDINGS: Intrauterine gestational sac: Single Yolk sac:  Yes Embryo:  Yes Cardiac Activity: Yes Heart Rate: 114 bpm CRL:  2.7 mm   5 w   5 d                  [place] EDC: 03/07/2018 Subchorionic hemorrhage:  None visualized. Maternal uterus/adnexae: Subchorionic hemorrhage: None visualized Right ovary: Normal containing corpus luteum. Left ovary: Normal Other :Retroverted uterus Free fluid:  None IMPRESSION: 1. Single living intrauterine gestation with an estimated gestational age of [redacted] weeks and 5 days. 2. No complications identified. Electronically Signed   By: Signa Kell M.D.   On: 07/10/2017 15:37   Review of Systems  Constitutional: Negative for fever.  Gastrointestinal: Positive for abdominal pain, diarrhea, nausea and vomiting.  Genitourinary: Negative for dysuria.  Neurological: Negative for headaches.   Physical Exam   Blood pressure 110/71, pulse 70, temperature 98.8 F (37.1 C), temperature source Oral, resp. rate 18, height 5\' 6"  (1.676 m), weight 245 lb (111.1 kg), last menstrual period 05/24/2017.  Physical Exam  Constitutional: She is oriented to person, place, and time. She appears well-developed and well-nourished. No distress.  HENT:  Head: Normocephalic.  Eyes: Pupils are equal, round, and reactive to light.  GI: Soft. She exhibits no distension. There is no tenderness. There is no rebound.  Musculoskeletal: Normal range of motion.  Neurological: She is alert and oriented to person, place, and time.  Skin: Skin is warm. She is not diaphoretic.  Psychiatric: Her behavior is normal.   MAU Course  Procedures  None  MDM  Zofran given in MAU UA without signs of dehydration No active vomiting in MAU [place] ordered   Assessment and Plan   A:  1. Nausea and vomiting in pregnancy   2. Abdominal pain in  pregnancy   3. [redacted] weeks gestation of pregnancy   4. Diarrhea during pregnancy     P:  Discharge home in stable condition Start prenatal care Rx: Zofran Small, frequent meals Bland diet   Rasch, Harolyn Rutherford, NP 07/10/2017 3:45 PM

## 2017-07-10 NOTE — MAU Note (Addendum)
Pt presents to MAU with c/o nausea, vomiting and diarrhea x 3 days. Pt has not been able to eat or drink well. Pt has had abdominal discomfort with vomiting. Pt denies VB and discharge.

## 2017-08-13 ENCOUNTER — Encounter: Payer: Self-pay | Admitting: Advanced Practice Midwife

## 2017-08-13 ENCOUNTER — Ambulatory Visit (INDEPENDENT_AMBULATORY_CARE_PROVIDER_SITE_OTHER): Payer: BLUE CROSS/BLUE SHIELD | Admitting: Advanced Practice Midwife

## 2017-08-13 ENCOUNTER — Ambulatory Visit: Payer: Self-pay | Admitting: Clinical

## 2017-08-13 VITALS — BP 118/78 | HR 92 | Wt 239.4 lb

## 2017-08-13 DIAGNOSIS — E669 Obesity, unspecified: Secondary | ICD-10-CM

## 2017-08-13 DIAGNOSIS — Z3401 Encounter for supervision of normal first pregnancy, first trimester: Secondary | ICD-10-CM

## 2017-08-13 DIAGNOSIS — Z8619 Personal history of other infectious and parasitic diseases: Secondary | ICD-10-CM

## 2017-08-13 DIAGNOSIS — O219 Vomiting of pregnancy, unspecified: Secondary | ICD-10-CM

## 2017-08-13 DIAGNOSIS — J4520 Mild intermittent asthma, uncomplicated: Secondary | ICD-10-CM

## 2017-08-13 DIAGNOSIS — Z6839 Body mass index (BMI) 39.0-39.9, adult: Secondary | ICD-10-CM | POA: Insufficient documentation

## 2017-08-13 DIAGNOSIS — F1290 Cannabis use, unspecified, uncomplicated: Secondary | ICD-10-CM | POA: Insufficient documentation

## 2017-08-13 DIAGNOSIS — F4323 Adjustment disorder with mixed anxiety and depressed mood: Secondary | ICD-10-CM

## 2017-08-13 DIAGNOSIS — Z3400 Encounter for supervision of normal first pregnancy, unspecified trimester: Secondary | ICD-10-CM | POA: Insufficient documentation

## 2017-08-13 HISTORY — DX: Mild intermittent asthma, uncomplicated: J45.20

## 2017-08-13 HISTORY — DX: Personal history of other infectious and parasitic diseases: Z86.19

## 2017-08-13 LAB — POCT URINALYSIS DIP (DEVICE)
BILIRUBIN URINE: NEGATIVE
GLUCOSE, UA: NEGATIVE mg/dL
Ketones, ur: NEGATIVE mg/dL
NITRITE: NEGATIVE
Protein, ur: NEGATIVE mg/dL
SPECIFIC GRAVITY, URINE: 1.02 (ref 1.005–1.030)
UROBILINOGEN UA: 0.2 mg/dL (ref 0.0–1.0)
pH: 7 (ref 5.0–8.0)

## 2017-08-13 MED ORDER — PROMETHAZINE HCL 12.5 MG PO TABS: 12.5000 mg | ORAL_TABLET | Freq: Four times a day (QID) | ORAL | 0 refills | Status: DC | PRN

## 2017-08-13 MED ORDER — PRENATAL VITAMIN PLUS LOW IRON 27-1 MG PO TABS: 1.0000 | ORAL_TABLET | Freq: Every day | ORAL | 3 refills | Status: DC

## 2017-08-13 NOTE — Patient Instructions (Signed)
Try doxylamine and Vitamin B6 for nausea/vomiting.

## 2017-08-13 NOTE — Progress Notes (Signed)
Subjective:   Tamara Bryant is a 27 y.o. G1P0 at 7125w4d by LMP being seen today for her first obstetrical visit.  Her obstetrical history is significant for none. Patient does intend to breast feed. Pregnancy history fully reviewed.  Patient reports nausea and vomiting. She states it has been getting better the last couple of weeks. She did got to ER to get medication to help with N/V - has tried Zofran and phenergan. She thinks phenergan has helped the most, requesting a refill.   HISTORY: OB History  Gravida Para Term Preterm AB Living  1 0 0 0 0 0  SAB TAB Ectopic Multiple Live Births  0 0 0 0 0    # Outcome Date GA Lbr Len/2nd Weight Sex Delivery Anes PTL Lv  1 Current            Last pap smear was 09/25/16 and was normal. Past Medical History:  Diagnosis Date  . Asthma   . History of tobacco use   . Marijuana use   . Obesity   . Seasonal allergies    Past Surgical History:  Procedure Laterality Date  . WISDOM TOOTH EXTRACTION     Family History  Problem Relation Age of Onset  . Hypertension Mother   . Sickle cell anemia Mother   . Diabetes Father   . Hypertension Father   . Diabetes Maternal Grandmother   . Diabetes Paternal Grandfather    Social History   Tobacco Use  . Smoking status: Former Smoker    Packs/day: 1.00  . Smokeless tobacco: Never Used  . Tobacco comment: 07/06/2017  Substance Use Topics  . Alcohol use: Yes    Comment: last 07/05  . Drug use: Yes    Types: Marijuana    Comment: last 07/07/2017   No Known Allergies No current outpatient medications on file prior to visit.   No current facility-administered medications on file prior to visit.      Exam   Vitals:   08/13/17 1012  BP: 118/78  Pulse: 92  Weight: 239 lb 6.4 oz (108.6 kg)   Fetal Heart Rate (bpm): 156  Pelvic Exam:  deferred  System: General: well-developed, well-nourished female in no acute distress   Breast:  normal appearance, no masses or tenderness   Skin: normal coloration and turgor, no rashes   Neurologic: oriented, normal, negative, normal mood   Extremities: normal strength, tone, and muscle mass, ROM of all joints is normal   HEENT extraocular movement intact and sclera clear, normal-appearing conjunctivae   Mouth/Teeth mucous membranes moist, pharynx normal without lesions and dental hygiene good   Neck supple and no masses   Cardiovascular: regular rate and rhythm, no murmurs  2+ radial pulses b/l   Respiratory:  no respiratory distress, normal breath sounds   Abdomen: soft, non-tender; bowel sounds normal; overweight     Assessment:   Pregnancy: G1P0 Patient Active Problem List   Diagnosis Date Noted  . Supervision of normal first pregnancy, antepartum 08/13/2017  . Obesity (BMI 30-39.9) 08/13/2017  . Mild intermittent asthma 08/13/2017  . Marijuana use 08/13/2017     Plan:  1. Supervision of normal first pregnancy, antepartum - Counseling provided for genetic testing, will return in 2 weeks for Panorama  - Culture, OB Urine - Cystic fibrosis gene test - GC/Chlamydia probe amp  - Obstetric Panel, Including HIV - SMN1 COPY NUMBER ANALYSIS (SMA Carrier Screen) - US MFM OB COMP + 14 WK; Future -  Hemoglobinopathy Evaluation - Hemoglobin A1c - Genetic Screening; Future - Prenatal Vit-Fe Fumarate-FA (PRENATAL VITAMIN PLUS LOW IRON) 27-1 MG TABS; Take 1 tablet by mouth daily.  Dispense: 60 tablet; Refill: 3 - Problem list reviewed and updated. - The nature of [facility] with multiple MDs and other - Advanced Practice Providers was explained to patient; also emphasized that residents, students are part of our team. - Routine obstetric precautions reviewed.  2. Obesity (BMI 30-39.9) - Counseled on weight loss goals based on pre-pregnancy BMI (11-21lbs)  - Hemoglobin A1c  3. Nausea and vomiting in pregnancy - Counseled on using ginger, peppermint oil, doxylamine/B6  - promethazine  (PHENERGAN) 12.5 MG tablet; Take 1 tablet (12.5 mg total) by mouth every 6 (six) hours as needed for nausea or vomiting.  Dispense: 20 tablet; Refill: 0  4. Mild intermittent asthma without complication - counseled on risk of worsening with pregnancy  5. Marijuana use - counseled on use   Return in about 4 weeks (around 09/10/2017) for next routine OB visit and in 2 weeks for labs.   Tamera StandsLaurel S Thorne Wirz, DO

## 2017-08-13 NOTE — BH Specialist Note (Signed)
Integrated Behavioral Health Initial Visit  MRN: [id number] Name: Tamara Bryant  Number of Integrated Behavioral Health Clinician visits:: 1/6 2 Total Session Start time: 11: 10 Session End time: 11:15 Total time: 5 minuts  Type of Service: Integrated Behavioral Health- Individual/Family Interpretor:No. Interpretor Name and Language: n/a   Warm Hand Off Completed.       SUBJECTIVE: Tamara Bryant is a 27 y.o. female accompanied by Friend Patient was referred by Thressa ShellerHeather Hogan, CNM for initial OB intro to integrated behavioral health services. Patient reports the following symptoms/concerns: Pt states her only concern today is feeling nausea/morning sickness, and attributes increase in symptoms of depression/anxiety to feeling sick. Pt has not established with PCP or psychiatry since last visit, and feels she is doing well. Pt's goal is to start fall classes at [employer] this month.  Duration of problem: -; Severity of problem: -  OBJECTIVE: Mood: Normal and Affect: Appropriate Risk of harm to self or others: No plan to harm self or others  LIFE CONTEXT: Family and Social: - School/Work: - Self-Care: - Life Changes: Current pregnancy; starting classes at [employer] this month  GOALS ADDRESSED: Patient will: 1. Reduce symptoms of: anxiety and depression   INTERVENTIONS: Interventions utilized: Psychoeducation and/or Health Education  Standardized Assessments completed: GAD-7 and PHQ 9  ASSESSMENT: Patient currently experiencing Adjustment disorder with mixed anxious and depressed mood   Patient may benefit from reintroduction to integrated behavioral health services today and psychoeducation.  PLAN: 1. Follow up with behavioral health clinician on : One month, or as needed 2. Behavioral recommendations:  -Read educational materials regarding coping with symptoms of anxiety and depression  3. Referral(s): Integrated Behavioral Health Services (In Clinic) 4. "From  scale of 1-10, how likely are you to follow plan?": -  Rae LipsJamie C McMannes, LCSW   Dep[facility] screen [facility] 09/25/2016  Decreased Interest 2  Down, Depressed, Hopeless 3  PHQ - 2 Score 5  Altered sleeping 3  Tired, decreased energy 3  Change in appetite 3  Feeling bad or failure about yourself  2  Trouble concentrating 2  Moving slowly or fidgety/restless 2  Suicidal thoughts 0  PHQ-9 Score 20   GAD 7 : Generalized Anxiety Score 09/25/2016  Nervous, Anxious, on Edge 3  Control/stop worrying 3  Worry too much - different things 3  Trouble relaxing 3  Restless 2  Easily annoyed or irritable 3  Afraid - awful might happen 2  Total GAD 7 Score 19

## 2017-08-15 LAB — CULTURE, OB URINE

## 2017-08-15 LAB — URINE CULTURE, OB REFLEX

## 2017-08-21 LAB — SMN1 COPY NUMBER ANALYSIS (SMA CARRIER SCREENING)

## 2017-08-21 LAB — HEMOGLOBIN A1C
Est. average glucose Bld gHb Est-mCnc: 80 mg/dL
HEMOGLOBIN A1C: 4.4 % — AB (ref 4.8–5.6)

## 2017-08-21 LAB — OBSTETRIC PANEL, INCLUDING HIV
Antibody Screen: NEGATIVE
BASOS ABS: 0 10*3/uL (ref 0.0–0.2)
Basos: 0 %
EOS (ABSOLUTE): 0.1 10*3/uL (ref 0.0–0.4)
Eos: 1 %
HEP B S AG: NEGATIVE
HIV SCREEN 4TH GENERATION: NONREACTIVE
Hematocrit: 40.7 % (ref 34.0–46.6)
Hemoglobin: 13.6 g/dL (ref 11.1–15.9)
IMMATURE GRANS (ABS): 0 10*3/uL (ref 0.0–0.1)
Immature Granulocytes: 0 %
LYMPHS: 28 %
Lymphocytes Absolute: 2.8 10*3/uL (ref 0.7–3.1)
MCH: 27.5 pg (ref 26.6–33.0)
MCHC: 33.4 g/dL (ref 31.5–35.7)
MCV: 82 fL (ref 79–97)
MONOCYTES: 4 %
Monocytes Absolute: 0.4 10*3/uL (ref 0.1–0.9)
NEUTROS ABS: 6.7 10*3/uL (ref 1.4–7.0)
Neutrophils: 67 %
PLATELETS: 331 10*3/uL (ref 150–450)
RBC: 4.95 x10E6/uL (ref 3.77–5.28)
RDW: 16 % — ABNORMAL HIGH (ref 12.3–15.4)
RPR Ser Ql: NONREACTIVE
Rh Factor: POSITIVE
WBC: 10.1 10*3/uL (ref 3.4–10.8)

## 2017-08-21 LAB — HEMOGLOBINOPATHY EVALUATION
FERRITIN: 79 ng/mL (ref 15–150)
HGB F QUANT: 26.8 % — AB (ref 0.0–2.0)
HGB SOLUBILITY: NEGATIVE
Hgb A2 Quant: 1.8 % (ref 1.8–3.2)
Hgb A: 71.4 % — ABNORMAL LOW (ref 96.4–98.8)
Hgb C: 0 %
Hgb S: 0 %
Hgb Variant: 0 %

## 2017-08-21 LAB — CYSTIC FIBROSIS GENE TEST

## 2017-08-23 ENCOUNTER — Inpatient Hospital Stay (HOSPITAL_COMMUNITY)
Admission: AD | Admit: 2017-08-23 | Discharge: 2017-08-24 | Disposition: A | Discharge status: Home / Self Care | Payer: BLUE CROSS/BLUE SHIELD | Source: Ambulatory Visit | Attending: Obstetrics and Gynecology | Admitting: Obstetrics and Gynecology

## 2017-08-23 DIAGNOSIS — R1031 Right lower quadrant pain: Secondary | ICD-10-CM

## 2017-08-23 DIAGNOSIS — K5900 Constipation, unspecified: Secondary | ICD-10-CM | POA: Insufficient documentation

## 2017-08-23 DIAGNOSIS — L308 Other specified dermatitis: Secondary | ICD-10-CM

## 2017-08-23 DIAGNOSIS — Z3400 Encounter for supervision of normal first pregnancy, unspecified trimester: Secondary | ICD-10-CM

## 2017-08-23 DIAGNOSIS — B354 Tinea corporis: Secondary | ICD-10-CM

## 2017-08-23 DIAGNOSIS — Z79899 Other long term (current) drug therapy: Secondary | ICD-10-CM | POA: Insufficient documentation

## 2017-08-23 DIAGNOSIS — R21 Rash and other nonspecific skin eruption: Secondary | ICD-10-CM | POA: Insufficient documentation

## 2017-08-23 DIAGNOSIS — Z87891 Personal history of nicotine dependence: Secondary | ICD-10-CM | POA: Insufficient documentation

## 2017-08-23 DIAGNOSIS — R319 Hematuria, unspecified: Secondary | ICD-10-CM

## 2017-08-23 DIAGNOSIS — O99611 Diseases of the digestive system complicating pregnancy, first trimester: Secondary | ICD-10-CM | POA: Insufficient documentation

## 2017-08-24 ENCOUNTER — Encounter (HOSPITAL_COMMUNITY): Payer: Self-pay | Admitting: *Deleted

## 2017-08-24 ENCOUNTER — Inpatient Hospital Stay (HOSPITAL_COMMUNITY): Payer: BLUE CROSS/BLUE SHIELD

## 2017-08-24 ENCOUNTER — Inpatient Hospital Stay (HOSPITAL_COMMUNITY)
Admission: AD | Admit: 2017-08-24 | Discharge: 2017-08-24 | Disposition: A | Discharge status: Home / Self Care | Payer: BLUE CROSS/BLUE SHIELD | Source: Ambulatory Visit | Attending: Obstetrics & Gynecology | Admitting: Obstetrics & Gynecology

## 2017-08-24 ENCOUNTER — Other Ambulatory Visit: Payer: Self-pay

## 2017-08-24 ENCOUNTER — Encounter (HOSPITAL_COMMUNITY): Payer: Self-pay

## 2017-08-24 DIAGNOSIS — R21 Rash and other nonspecific skin eruption: Secondary | ICD-10-CM | POA: Diagnosis not present

## 2017-08-24 DIAGNOSIS — N200 Calculus of kidney: Secondary | ICD-10-CM

## 2017-08-24 DIAGNOSIS — O3680X0 Pregnancy with inconclusive fetal viability, not applicable or unspecified: Secondary | ICD-10-CM

## 2017-08-24 DIAGNOSIS — O99611 Diseases of the digestive system complicating pregnancy, first trimester: Secondary | ICD-10-CM | POA: Diagnosis present

## 2017-08-24 DIAGNOSIS — Z87891 Personal history of nicotine dependence: Secondary | ICD-10-CM | POA: Diagnosis not present

## 2017-08-24 DIAGNOSIS — Z79899 Other long term (current) drug therapy: Secondary | ICD-10-CM | POA: Diagnosis not present

## 2017-08-24 DIAGNOSIS — O219 Vomiting of pregnancy, unspecified: Secondary | ICD-10-CM

## 2017-08-24 DIAGNOSIS — R1031 Right lower quadrant pain: Secondary | ICD-10-CM | POA: Diagnosis not present

## 2017-08-24 DIAGNOSIS — K5900 Constipation, unspecified: Secondary | ICD-10-CM

## 2017-08-24 LAB — URINALYSIS, ROUTINE W REFLEX MICROSCOPIC
BACTERIA UA: NONE SEEN
BILIRUBIN URINE: NEGATIVE
BILIRUBIN URINE: NEGATIVE
GLUCOSE, UA: NEGATIVE mg/dL
Glucose, UA: NEGATIVE mg/dL
Ketones, ur: 80 mg/dL — AB
Ketones, ur: 80 mg/dL — AB
LEUKOCYTES UA: NEGATIVE
LEUKOCYTES UA: NEGATIVE
NITRITE: NEGATIVE
Nitrite: NEGATIVE
PH: 6 (ref 5.0–8.0)
Protein, ur: 30 mg/dL — AB
Protein, ur: NEGATIVE mg/dL
RBC / HPF: >50 RBC/hpf — ABNORMAL HIGH (ref 0–5)
SPECIFIC GRAVITY, URINE: 1.019 (ref 1.005–1.030)
SPECIFIC GRAVITY, URINE: 1.026 (ref 1.005–1.030)
pH: 6 (ref 5.0–8.0)

## 2017-08-24 LAB — COMPREHENSIVE METABOLIC PANEL
ALK PHOS: 54 U/L (ref 38–126)
ALT: 11 U/L (ref 0–44)
ANION GAP: 13 (ref 5–15)
AST: 15 U/L (ref 15–41)
Albumin: 4 g/dL (ref 3.5–5.0)
BUN: 11 mg/dL (ref 6–20)
CALCIUM: 9.5 mg/dL (ref 8.9–10.3)
CHLORIDE: 106 mmol/L (ref 98–111)
CO2: 16 mmol/L — ABNORMAL LOW (ref 22–32)
Creatinine, Ser: 0.82 mg/dL (ref 0.44–1.00)
GFR calc non Af Amer: >60 mL/min (ref >60)
Glucose, Bld: 97 mg/dL (ref 70–99)
Potassium: 3.7 mmol/L (ref 3.5–5.1)
Sodium: 135 mmol/L (ref 135–145)
Total Bilirubin: 0.8 mg/dL (ref 0.3–1.2)
Total Protein: 6.9 g/dL (ref 6.5–8.1)

## 2017-08-24 LAB — CBC WITH DIFFERENTIAL/PLATELET
Basophils Absolute: 0 10*3/uL (ref 0.0–0.1)
Basophils Relative: 0 %
EOS PCT: 1 %
Eosinophils Absolute: 0.1 10*3/uL (ref 0.0–0.7)
HCT: 36.7 % (ref 36.0–46.0)
Hemoglobin: 12.9 g/dL (ref 12.0–15.0)
LYMPHS ABS: 4.3 10*3/uL — AB (ref 0.7–4.0)
Lymphocytes Relative: 30 %
MCH: 28 pg (ref 26.0–34.0)
MCHC: 35.1 g/dL (ref 30.0–36.0)
MCV: 79.8 fL (ref 78.0–100.0)
MONO ABS: 0.5 10*3/uL (ref 0.1–1.0)
MONOS PCT: 3 %
Neutro Abs: 9.7 10*3/uL — ABNORMAL HIGH (ref 1.7–7.7)
Neutrophils Relative %: 66 %
PLATELETS: 344 10*3/uL (ref 150–400)
RBC: 4.6 MIL/uL (ref 3.87–5.11)
RDW: 14.8 % (ref 11.5–15.5)
WBC: 14.6 10*3/uL — ABNORMAL HIGH (ref 4.0–10.5)

## 2017-08-24 MED ORDER — OXYCODONE-ACETAMINOPHEN 5-325 MG PO TABS: 1.0000 | ORAL_TABLET | Freq: Four times a day (QID) | ORAL | 0 refills | Status: DC | PRN

## 2017-08-24 MED ORDER — LIDOCAINE HCL URETHRAL/MUCOSAL 2 % EX GEL
1.0000 | Freq: Once | CUTANEOUS | Status: AC
Start: 2017-08-24 — End: 2017-08-24
  Administered 2017-08-24: 1 via TOPICAL
  Filled 2017-08-24: qty 5

## 2017-08-24 MED ORDER — PROMETHAZINE HCL 25 MG PO TABS: 12.5000 mg | ORAL_TABLET | Freq: Four times a day (QID) | ORAL | 0 refills | Status: DC | PRN

## 2017-08-24 MED ORDER — TAMSULOSIN HCL 0.4 MG PO CAPS: 0.4000 mg | ORAL_CAPSULE | Freq: Every day | ORAL | 0 refills | Status: DC

## 2017-08-24 MED ORDER — PROMETHAZINE HCL 12.5 MG PO TABS: 12.5000 mg | ORAL_TABLET | Freq: Four times a day (QID) | ORAL | 0 refills | Status: DC | PRN

## 2017-08-24 MED ORDER — CLOTRIMAZOLE 1 % EX CREA
TOPICAL_CREAM | Freq: Once | CUTANEOUS | Status: AC
  Administered 2017-08-24: 02:00:00 via TOPICAL
  Filled 2017-08-24: qty 15

## 2017-08-24 MED ORDER — POLYETHYLENE GLYCOL 3350 17 GM/SCOOP PO POWD: 17.0000 g | Freq: Every day | ORAL | 0 refills | Status: DC

## 2017-08-24 MED ORDER — HYDROMORPHONE HCL 1 MG/ML IJ SOLN
1.0000 mg | Freq: Once | INTRAMUSCULAR | Status: AC
  Administered 2017-08-24: 1 mg via INTRAVENOUS
  Filled 2017-08-24: qty 1

## 2017-08-24 MED ORDER — HYDROMORPHONE HCL 1 MG/ML IJ SOLN
1.0000 mg | Freq: Once | INTRAMUSCULAR | Status: DC
  Filled 2017-08-24: qty 1

## 2017-08-24 MED ORDER — PROMETHAZINE HCL 25 MG/ML IJ SOLN
12.5000 mg | Freq: Once | INTRAMUSCULAR | Status: AC
  Administered 2017-08-24: 12.5 mg via INTRAVENOUS
  Filled 2017-08-24: qty 1

## 2017-08-24 MED ORDER — HYDROMORPHONE HCL 1 MG/ML IJ SOLN
1.0000 mg | Freq: Once | INTRAMUSCULAR | Status: AC
  Administered 2017-08-24: 1 mg via INTRAVENOUS

## 2017-08-24 MED ORDER — LACTATED RINGERS IV BOLUS
1000.0000 mL | Freq: Once | INTRAVENOUS | Status: AC
  Administered 2017-08-24: 1000 mL via INTRAVENOUS

## 2017-08-24 MED ORDER — CLOTRIMAZOLE 1 % EX CREA: 1.0000 "application " | TOPICAL_CREAM | Freq: Two times a day (BID) | CUTANEOUS | 0 refills | Status: DC

## 2017-08-24 MED ORDER — DOCUSATE SODIUM 100 MG PO CAPS: 100.0000 mg | ORAL_CAPSULE | Freq: Two times a day (BID) | ORAL | 2 refills | Status: DC | PRN

## 2017-08-24 MED ORDER — ONDANSETRON HCL 4 MG/2ML IJ SOLN
4.0000 mg | Freq: Once | INTRAMUSCULAR | Status: AC
  Administered 2017-08-24: 4 mg via INTRAVENOUS
  Filled 2017-08-24: qty 2

## 2017-08-24 NOTE — MAU Note (Signed)
Pt seen in MAU last night, states pain in RLQ & R flank is much worse, denies bleeding.

## 2017-08-24 NOTE — Discharge Instructions (Signed)
Hematuria, Adult Hematuria is blood in your urine. It can be caused by a bladder infection, kidney infection, prostate infection, kidney stone, or cancer of your urinary tract. Infections can usually be treated with medicine, and a kidney stone usually will pass through your urine. If neither of these is the cause of your hematuria, further workup to find out the reason may be needed. It is very important that you tell your health care provider about any blood you see in your urine, even if the blood stops without treatment or happens without causing pain. Blood in your urine that happens and then stops and then happens again can be a symptom of a very serious condition. Also, pain is not a symptom in the initial stages of many urinary cancers. Follow these instructions at home:  Drink lots of fluid, 3-4 quarts a day. If you have been diagnosed with an infection, cranberry juice is especially recommended, in addition to large amounts of water.  Avoid caffeine, tea, and carbonated beverages because they tend to irritate the bladder.  Avoid alcohol because it may irritate the prostate.  Take all medicines as directed by your health care provider.  If you were prescribed an antibiotic medicine, finish it all even if you start to feel better.  If you have been diagnosed with a kidney stone, follow your health care provider's instructions regarding straining your urine to catch the stone.  Empty your bladder often. Avoid holding urine for long periods of time.  After a bowel movement, women should cleanse front to back. Use each tissue only once.  Empty your bladder before and after sexual intercourse if you are a female. Contact a health care provider if:  You develop back pain.  You have a fever.  You have a feeling of sickness in your stomach (nausea) or vomiting.  Your symptoms are not better in 3 days. Return sooner if you are getting worse. Get help right away if:  You develop  severe vomiting and are unable to keep the medicine down.  You develop severe back or abdominal pain despite taking your medicines.  You begin passing a large amount of blood or clots in your urine.  You feel extremely weak or faint, or you pass out. This information is not intended to replace advice given to you by your health care provider. Make sure you discuss any questions you have with your health care provider. Document Released: 12/19/2004 Document Revised: 05/27/2015 Document Reviewed: 08/19/2012 Elsevier Interactive Patient Education  2017 [employer] Body Ringworm Body ringworm is an infection of the skin that often causes a ring-shaped rash. Body ringworm can affect any part of your skin. It can spread easily to others. Body ringworm is also called tinea corporis. What are the causes? This condition is caused by funguses called dermatophytes. The condition develops when these funguses grow out of control on the skin. You can get this condition if you touch a person or animal that has it. You can also get it if you share clothing, bedding, towels, or any other object with an infected person or pet. What increases the risk? This condition is more likely to develop in:  Athletes who often make skin-to-skin contact with other athletes, such as wrestlers.  People who share equipment and mats.  People with a weakened immune system.  What are the signs or symptoms? Symptoms of this condition include:  Itchy, raised red spots and bumps.  Red scaly patches.  A ring-shaped rash. The rash may  have: ? A clear center. ? Scales or red bumps at its center. ? Redness near its borders. ? Dry and scaly skin on or around it.  How is this diagnosed? This condition can usually be diagnosed with a skin exam. A skin scraping may be taken from the affected area and examined under a microscope to see if the fungus is present. How is this treated? This condition may be treated  with:  An antifungal cream or ointment.  An antifungal shampoo.  Antifungal medicines. These may be prescribed if your ringworm is severe, keeps coming back, or lasts a long time.  Follow these instructions at home:  Take over-the-counter and prescription medicines only as told by your health care provider.  If you were given an antifungal cream or ointment: ? Use it as told by your health care provider. ? Wash the infected area and dry it completely before applying the cream or ointment.  If you were given an antifungal shampoo: ? Use it as told by your health care provider. ? Leave the shampoo on your body for 3-5 minutes before rinsing.  While you have a rash: ? Wear loose clothing to stop clothes from rubbing and irritating it. ? Wash or change your bed sheets every night.  If your pet has the same infection, take your pet to see a International aid/development worker. How is this prevented?  Practice good hygiene.  Wear sandals or shoes in public places and showers.  Do not share personal items with others.  Avoid touching red patches of skin on other people.  Avoid touching pets that have bald spots.  If you touch an animal that has a bald spot, wash your hands. Contact a health care provider if:  Your rash continues to spread after 7 days of treatment.  Your rash is not gone in 4 weeks.  The area around your rash gets red, warm, tender, and swollen. This information is not intended to replace advice given to you by your health care provider. Make sure you discuss any questions you have with your health care provider. Document Released: 12/17/1999 Document Revised: 05/27/2015 Document Reviewed: 10/15/2014 Elsevier Interactive Patient Education  [employer].

## 2017-08-24 NOTE — MAU Provider Note (Addendum)
History     CSN: [id number]  Arrival date and time: 08/24/17 1850   First Provider Initiated Contact with Patient 08/24/17 1915      Chief Complaint  Patient presents with  . Abdominal Pain  . Flank Pain   Abdominal Pain  Associated symptoms include dysuria, nausea and vomiting. Pertinent negatives include no fever or hematuria.  Flank Pain  This is a new problem. The current episode started yesterday. The problem occurs constantly. The problem has been gradually worsening since onset. The pain is present in the sacro-iliac. The quality of the pain is described as aching. Radiates to: abdomen  The pain is at a severity of 10/10. Exacerbated by: urination  Associated symptoms include abdominal pain and dysuria. Pertinent negatives include no fever. Risk factors include pregnancy. She has tried nothing for the symptoms.    OB History    Gravida  1   Para      Term      Preterm      AB      Living        SAB      TAB      Ectopic      Multiple      Live Births              Past Medical History:  Diagnosis Date  . Asthma   . History of tobacco use   . Marijuana use   . Obesity   . Seasonal allergies     Past Surgical History:  Procedure Laterality Date  . WISDOM TOOTH EXTRACTION      Family History  Problem Relation Age of Onset  . Hypertension Mother   . Sickle cell anemia Mother   . Diabetes Father   . Hypertension Father   . Diabetes Maternal Grandmother   . Diabetes Paternal Grandfather     Social History   Tobacco Use  . Smoking status: Former Smoker    Packs/day: 1.00  . Smokeless tobacco: Never Used  . Tobacco comment: 07/06/2017  Substance Use Topics  . Alcohol use: Yes    Comment: last 07/05  . Drug use: Yes    Types: Marijuana    Comment: LAST SMOKED ON TUESDAY 08-21-2017    Allergies: No Known Allergies  Medications Prior to Admission  Medication Sig Dispense Refill Last Dose  . clotrimazole (LOTRIMIN) 1 % cream Apply  1 application topically 2 (two) times daily. 30 g 0   . Prenatal Vit-Fe Fumarate-FA (PRENATAL VITAMIN PLUS LOW IRON) 27-1 MG TABS Take 1 tablet by mouth daily. 60 tablet 3 08/23/2017 at Unknown time  . [DISCONTINUED] promethazine (PHENERGAN) 12.5 MG tablet Take 1 tablet (12.5 mg total) by mouth every 6 (six) hours as needed for nausea or vomiting. 20 tablet 0 Past Week at Unknown time    Review of Systems  Constitutional: Negative for chills and fever.  Gastrointestinal: Positive for abdominal pain, nausea and vomiting.  Genitourinary: Positive for dysuria and flank pain. Negative for hematuria, urgency, vaginal bleeding and vaginal discharge.   Physical Exam   Blood pressure 127/88, pulse (!) 102, temperature 98.1 F (36.7 C), temperature source Oral, resp. rate (!) 22, last menstrual period 05/24/2017.  Physical Exam  Nursing note and vitals reviewed. Constitutional: She is oriented to person, place, and time. She appears well-developed and well-nourished. No distress.  HENT:  Head: Normocephalic.  Cardiovascular: Normal rate.  Respiratory: Effort normal.  GI: Soft. There is no tenderness. There is no rebound.  Genitourinary:  Genitourinary Comments: No CVA tenderness   Neurological: She is alert and oriented to person, place, and time.  Skin: Skin is warm and dry.  Psychiatric: She has a normal mood and affect.    MAU Course  Procedures  MDM 10:02 PM care turned over to Sharen Counter, CNM [place] and labs pending  Sharen Counter 10:02 PM 08/24/17  Assessment and Plan   US Renal  Result Date: 08/24/2017 CLINICAL DATA:  27 y/o F; right flank pain. Thirteen weeks pregnant. EXAM: RENAL / URINARY TRACT ULTRASOUND COMPLETE COMPARISON:  None. FINDINGS: Right Kidney: Length: 12.3 cm. Echogenicity within normal limits. No mass identified. Mild right hydronephrosis. Left Kidney: Length: 13.4 cm. Echogenicity within normal limits. No mass or hydronephrosis visualized. Bladder:  Appears normal for degree of bladder distention. Right ureteral jet noted. IMPRESSION: Mild right hydronephrosis. Electronically Signed   By: Mitzi Hansen M.D.   On: 08/24/2017 20:50   MDM: [place] results reviewed.  Mild right hydronephrosis, no visible stones.  Hemoglobin in urine today and yesterday in MAU. No rebound tenderness on my repeat exam, agreeing with previous CNM's exam.  Pain is mostly in right flank.  Likely small right renal calculi vs constipation.  Will treat with pain medication, Flomax, increased PO fluids, and treatment for constipation.  Rx for Percocet 5/325 Q 6 hours PRN x 10 tabs, Flomax 0.4 mg tab daily x 3 days, Colace 100 mg BID PRN, Miralax daily PRN.  Also, Phenergan 12.5-25 mg Q 6 hours PO for nausea.  Pt to strain urine for next 3 days, supplies given by RN.  Pt to f/u in office on Monday as scheduled, return to MAU if symptoms worsen.    A: 1. Renal calculus, right   2. Nausea and vomiting in pregnancy   3. Constipation during pregnancy in first trimester      P: D/C home See above note for Rx and plan  Sharen Counter, CNM 10:02 PM

## 2017-08-24 NOTE — MAU Provider Note (Signed)
Chief Complaint: Abdominal Pain   First Provider Initiated Contact with Patient 08/24/17 0039        SUBJECTIVE HPI: Tamara Bryant is a 27 y.o. G1P0 at 8234w1d by LMP who presents to maternity admissions reporting pain on right lower abdomen which shoots through to right lower back.  Started an hour ago.  Also has a painful rash on right chest which started yesterday and has gotten bigger and more painful.  Used cortisone cream with no improvement. . She denies vaginal bleeding, vaginal itching/burning, urinary symptoms, h/a, dizziness, n/v, or fever/chills.    Abdominal Pain  This is a new problem. The current episode started today. The problem occurs intermittently. The problem has been unchanged. The pain is located in the RLQ. The abdominal pain radiates to the back. Pertinent negatives include no anorexia, constipation, diarrhea, dysuria, fever, headaches, myalgias, nausea or vomiting. Nothing aggravates the pain. The pain is relieved by nothing. She has tried nothing for the symptoms.   RN note PT SAYS SHE HAS ABD PAIN ON RIGHT SIDE - RADIATES TO HER  BACK-  STARTED AT 2300.-  PT ALSO HAS A RASH UNDER RIGHT BREAST- STARTED YESTERDAY. Marland Kitchen.  PNC- WITH CLINIC.   LAST SEX-  SAT    Past Medical History:  Diagnosis Date  . Asthma   . History of tobacco use   . Marijuana use   . Obesity   . Seasonal allergies    Past Surgical History:  Procedure Laterality Date  . WISDOM TOOTH EXTRACTION     Social History   Socioeconomic History  . Marital status: Single    Spouse name: Not on file  . Number of children: Not on file  . Years of education: Not on file  . Highest education level: Not on file  Occupational History  . Not on file  Social Needs  . Financial resource strain: Not on file  . Food insecurity:    Worry: Not on file    Inability: Not on file  . Transportation needs:    Medical: Not on file    Non-medical: Not on file  Tobacco Use  . Smoking status: Former Smoker   Packs/day: 1.00  . Smokeless tobacco: Never Used  . Tobacco comment: 07/06/2017  Substance and Sexual Activity  . Alcohol use: Yes    Comment: last 07/05  . Drug use: Yes    Types: Marijuana    Comment: LAST SMOKED ON TUESDAY 08-21-2017  . Sexual activity: Yes    Birth control/protection: None  Lifestyle  . Physical activity:    Days per week: Not on file    Minutes per session: Not on file  . Stress: Not on file  Relationships  . Social connections:    Talks on phone: Not on file    Gets together: Not on file    Attends religious service: Not on file    Active member of club or organization: Not on file    Attends meetings of clubs or organizations: Not on file    Relationship status: Not on file  . Intimate partner violence:    Fear of current or ex partner: Not on file    Emotionally abused: Not on file    Physically abused: Not on file    Forced sexual activity: Not on file  Other Topics Concern  . Not on file  Social History Narrative   Currently in school for business, entrepreneurship    No current facility-administered medications on file prior  to encounter.    Current Outpatient Medications on File Prior to Encounter  Medication Sig Dispense Refill  . Prenatal Vit-Fe Fumarate-FA (PRENATAL VITAMIN PLUS LOW IRON) 27-1 MG TABS Take 1 tablet by mouth daily. 60 tablet 3  . promethazine (PHENERGAN) 12.5 MG tablet Take 1 tablet (12.5 mg total) by mouth every 6 (six) hours as needed for nausea or vomiting. 20 tablet 0   No Known Allergies  I have reviewed patient's Past Medical Hx, Surgical Hx, Family Hx, Social Hx, medications and allergies.   ROS:  Review of Systems  Constitutional: Negative for fever.  Gastrointestinal: Positive for abdominal pain. Negative for anorexia, constipation, diarrhea, nausea and vomiting.  Genitourinary: Negative for dysuria.  Musculoskeletal: Negative for myalgias.  Neurological: Negative for headaches.   Review of Systems  Other  systems negative   Physical Exam  Physical Exam Patient Vitals for the past 24 hrs:  BP Temp Temp src Pulse Resp Height Weight  08/24/17 0014 117/76 98.5 F (36.9 C) Oral 99 20 5\' 5"  (1.651 m) 106.7 kg   Constitutional: Well-developed, well-nourished female in no acute distress.  Cardiovascular: normal rate Respiratory: normal effort GI: Abd soft, non-tender except mildly tender RLQ.  No rebound or guarding.Marland Kitchen Pos BS x 4 MS: Extremities nontender, no edema, normal ROM Neurologic: Alert and oriented x 4.  GU: Neg CVAT. Skin:  Annular skin eruption right chest           There is scaling and appearance of plaques            Scattered white tissue            There are some areas which appear linear             There is leading scale to the eruption (feature of tinea)            There is an area in center which is weeping.            All areas are tender to touch.              Viral culture obtained for PCR Varicella Zoster.       PELVIC EXAM: No cervical motion tenderness.  EGBUS and vagina normal.  Cervix long and closed.  Uterus nontender.  Adnexae nontender bilaterally  FHT 164 by doppler  LAB RESULTS Results for orders placed or performed during the hospital encounter of 08/23/17 (from the past 24 hour(s))  Urinalysis, Routine w reflex microscopic     Status: Abnormal   Collection Time: 08/24/17 12:23 AM  Result Value Ref Range   Color, Urine YELLOW YELLOW   APPearance HAZY (A) CLEAR   Specific Gravity, Urine 1.026 1.005 - 1.030   pH 6.0 5.0 - 8.0   Glucose, UA NEGATIVE NEGATIVE mg/dL   Hgb urine dipstick LARGE (A) NEGATIVE   Bilirubin Urine NEGATIVE NEGATIVE   Ketones, ur 80 (A) NEGATIVE mg/dL   Protein, ur 30 (A) NEGATIVE mg/dL   Nitrite NEGATIVE NEGATIVE   Leukocytes, UA NEGATIVE NEGATIVE   RBC / HPF >50 (H) 0 - 5 RBC/hpf   WBC, UA 6-10 0 - 5 WBC/hpf   Bacteria, UA NONE SEEN NONE SEEN   Squamous Epithelial / LPF 0-5 0 - 5   Mucus PRESENT      A/Positive/--  (08/12 1104)  IMAGING No results found.  MAU Management/MDM: Fetal heart rate obtained by Doppler Suspect the RLQ and back pain are either related to known right corpus luteum  cyst  Or possible lower ureteral stone (based on hematuria with otherwise negative urine) As for the skin lesions, exam is most consistent with tinea corporis.   Topical xylocaine applied for comfort here but will not prescribe Topical clotrimazole ordered  I did get varicella PCR sample just in case this is a strange presentation for zoster, due to how tender area is.  I think the moist area is just skin maceration, and doubt it is varicella as the appearance is not consistent with typical zoster.   ASSESSMENT 1. Supervision of normal first pregnancy, antepartum   2.    Right lower quadrant pain/back pain 3.     Hematuria 4.     Skin eruption, suspect tinea corporis  PLAN Discharge home Rx Clotrimazole for bid use Varicella PCR sent Push PO fluids If pain worsens, may need to image renal US. Urine sent to culture Pt stable at time of discharge. Encouraged to return here or to other Urgent Care/ED if she develops worsening of symptoms, increase in pain, fever, or other concerning symptoms.    Wynelle Bourgeois CNM, MSN Certified Nurse-Midwife 08/24/2017  12:39 AM

## 2017-08-24 NOTE — MAU Note (Signed)
PT SAYS SHE HAS ABD PAIN ON RIGHT SIDE - RADIATES TO HER  BACK-  STARTED AT 2300.-  PT ALSO HAS A RASH UNDER RIGHT BREAST- STARTED YESTERDAY. Marland Kitchen.  PNC- WITH CLINIC.   LAST SEX-  SAT

## 2017-08-25 ENCOUNTER — Inpatient Hospital Stay (HOSPITAL_COMMUNITY): Payer: BLUE CROSS/BLUE SHIELD

## 2017-08-25 ENCOUNTER — Inpatient Hospital Stay (HOSPITAL_COMMUNITY)
Admission: AD | Admit: 2017-08-25 | Discharge: 2017-08-26 | Disposition: A | Discharge status: Home / Self Care | Payer: BLUE CROSS/BLUE SHIELD | Source: Ambulatory Visit | Attending: Obstetrics and Gynecology | Admitting: Obstetrics and Gynecology

## 2017-08-25 ENCOUNTER — Encounter (HOSPITAL_COMMUNITY): Payer: Self-pay | Admitting: *Deleted

## 2017-08-25 DIAGNOSIS — R1011 Right upper quadrant pain: Secondary | ICD-10-CM | POA: Insufficient documentation

## 2017-08-25 DIAGNOSIS — R3129 Other microscopic hematuria: Secondary | ICD-10-CM | POA: Insufficient documentation

## 2017-08-25 DIAGNOSIS — Z79899 Other long term (current) drug therapy: Secondary | ICD-10-CM | POA: Diagnosis not present

## 2017-08-25 DIAGNOSIS — O99511 Diseases of the respiratory system complicating pregnancy, first trimester: Secondary | ICD-10-CM | POA: Insufficient documentation

## 2017-08-25 DIAGNOSIS — O26891 Other specified pregnancy related conditions, first trimester: Secondary | ICD-10-CM | POA: Insufficient documentation

## 2017-08-25 DIAGNOSIS — M545 Low back pain: Secondary | ICD-10-CM | POA: Diagnosis not present

## 2017-08-25 DIAGNOSIS — R109 Unspecified abdominal pain: Secondary | ICD-10-CM | POA: Diagnosis not present

## 2017-08-25 DIAGNOSIS — Z87891 Personal history of nicotine dependence: Secondary | ICD-10-CM | POA: Insufficient documentation

## 2017-08-25 DIAGNOSIS — J45909 Unspecified asthma, uncomplicated: Secondary | ICD-10-CM | POA: Diagnosis not present

## 2017-08-25 DIAGNOSIS — O99211 Obesity complicating pregnancy, first trimester: Secondary | ICD-10-CM | POA: Diagnosis not present

## 2017-08-25 DIAGNOSIS — R1030 Lower abdominal pain, unspecified: Secondary | ICD-10-CM | POA: Insufficient documentation

## 2017-08-25 DIAGNOSIS — Z3A12 12 weeks gestation of pregnancy: Secondary | ICD-10-CM | POA: Insufficient documentation

## 2017-08-25 DIAGNOSIS — Z3400 Encounter for supervision of normal first pregnancy, unspecified trimester: Secondary | ICD-10-CM

## 2017-08-25 LAB — CBC WITH DIFFERENTIAL/PLATELET
BASOS ABS: 0 10*3/uL (ref 0.0–0.1)
Basophils Relative: 0 %
Eosinophils Absolute: 0.1 10*3/uL (ref 0.0–0.7)
Eosinophils Relative: 1 %
HEMATOCRIT: 36.5 % (ref 36.0–46.0)
Hemoglobin: 12.6 g/dL (ref 12.0–15.0)
Lymphocytes Relative: 26 %
Lymphs Abs: 4 10*3/uL (ref 0.7–4.0)
MCH: 28.2 pg (ref 26.0–34.0)
MCHC: 34.5 g/dL (ref 30.0–36.0)
MCV: 81.7 fL (ref 78.0–100.0)
Monocytes Absolute: 0.6 10*3/uL (ref 0.1–1.0)
Monocytes Relative: 4 %
NEUTROS ABS: 10.7 10*3/uL — AB (ref 1.7–7.7)
Neutrophils Relative %: 69 %
Platelets: 313 10*3/uL (ref 150–400)
RBC: 4.47 MIL/uL (ref 3.87–5.11)
RDW: 15 % (ref 11.5–15.5)
WBC: 15.4 10*3/uL — AB (ref 4.0–10.5)

## 2017-08-25 LAB — BASIC METABOLIC PANEL
ANION GAP: 10 (ref 5–15)
BUN: 10 mg/dL (ref 6–20)
CHLORIDE: 103 mmol/L (ref 98–111)
CO2: 24 mmol/L (ref 22–32)
Calcium: 9.5 mg/dL (ref 8.9–10.3)
Creatinine, Ser: 0.89 mg/dL (ref 0.44–1.00)
GFR calc Af Amer: >60 mL/min (ref >60)
GFR calc non Af Amer: >60 mL/min (ref >60)
GLUCOSE: 85 mg/dL (ref 70–99)
POTASSIUM: 3.7 mmol/L (ref 3.5–5.1)
Sodium: 137 mmol/L (ref 135–145)

## 2017-08-25 LAB — CULTURE, OB URINE: Culture: <10000 — AB

## 2017-08-25 LAB — URINALYSIS, ROUTINE W REFLEX MICROSCOPIC
BACTERIA UA: NONE SEEN
Bilirubin Urine: NEGATIVE
GLUCOSE, UA: NEGATIVE mg/dL
Ketones, ur: NEGATIVE mg/dL
Leukocytes, UA: NEGATIVE
Nitrite: NEGATIVE
PROTEIN: 30 mg/dL — AB
RBC / HPF: >50 RBC/hpf — ABNORMAL HIGH (ref 0–5)
SPECIFIC GRAVITY, URINE: 1.018 (ref 1.005–1.030)
pH: 5 (ref 5.0–8.0)

## 2017-08-25 LAB — LIPASE, BLOOD: Lipase: 25 U/L (ref 11–51)

## 2017-08-25 MED ORDER — PROMETHAZINE HCL 25 MG/ML IJ SOLN
12.5000 mg | Freq: Four times a day (QID) | INTRAMUSCULAR | Status: DC | PRN
  Administered 2017-08-25: 12.5 mg via INTRAVENOUS
  Filled 2017-08-25: qty 1

## 2017-08-25 MED ORDER — LACTATED RINGERS IV BOLUS
1000.0000 mL | Freq: Once | INTRAVENOUS | Status: AC
  Administered 2017-08-25: 1000 mL via INTRAVENOUS

## 2017-08-25 MED ORDER — HYDROMORPHONE HCL 2 MG PO TABS: 4.0000 mg | ORAL_TABLET | Freq: Once | ORAL | Status: DC

## 2017-08-25 MED ORDER — HYDROMORPHONE HCL 1 MG/ML IJ SOLN
1.0000 mg | Freq: Once | INTRAMUSCULAR | Status: AC
  Administered 2017-08-25: 1 mg via INTRAVENOUS
  Filled 2017-08-25: qty 1

## 2017-08-25 NOTE — MAU Provider Note (Signed)
Chief Complaint: Abdominal Pain and Back Pain   First Provider Initiated Contact with Patient 08/25/17 2132      SUBJECTIVE HPI: Tamara Bryant is a 27 y.o. G1P0 at [id number] LMP who presents to maternity admissions reporting return of severe right flank, right lower back, and right lower abdomen pain today. This is the third MAU visit in 3 days for similar symptoms.  She is taking Percocet and Phenergan as prescribed but cannot keep down any food and little fluids.  She has kept down some of the pain medication but it is not helping. Her pain, which is sharp stabbing and cramping pain in her right side that radiates to her right lower abdomen and right lower back is gradually worsening over the day and is now severe.  She also has some right upper abdominal pain she noticed today.  There are no other associated symptoms. The only other treatment she has tried is Colace for constipation. She denies vaginal bleeding, vaginal itching/burning, urinary symptoms, h/a, dizziness, or fever/chills.     HPI  Past Medical History:  Diagnosis Date  . Asthma   . History of tobacco use   . Marijuana use   . Obesity   . Seasonal allergies    Past Surgical History:  Procedure Laterality Date  . WISDOM TOOTH EXTRACTION     Social History   Socioeconomic History  . Marital status: Single    Spouse name: Not on file  . Number of children: Not on file  . Years of education: Not on file  . Highest education level: Not on file  Occupational History  . Not on file  Social Needs  . Financial resource strain: Not on file  . Food insecurity:    Worry: Not on file    Inability: Not on file  . Transportation needs:    Medical: Not on file    Non-medical: Not on file  Tobacco Use  . Smoking status: Former Smoker    Packs/day: 1.00  . Smokeless tobacco: Never Used  . Tobacco comment: 07/06/2017  Substance and Sexual Activity  . Alcohol use: Yes    Comment: last 07/05  . Drug use: Yes    Types:  Marijuana    Comment: LAST SMOKED ON TUESDAY 08-21-2017  . Sexual activity: Yes    Birth control/protection: None  Lifestyle  . Physical activity:    Days per week: Not on file    Minutes per session: Not on file  . Stress: Not on file  Relationships  . Social connections:    Talks on phone: Not on file    Gets together: Not on file    Attends religious service: Not on file    Active member of club or organization: Not on file    Attends meetings of clubs or organizations: Not on file    Relationship status: Not on file  . Intimate partner violence:    Fear of current or ex partner: Not on file    Emotionally abused: Not on file    Physically abused: Not on file    Forced sexual activity: Not on file  Other Topics Concern  . Not on file  Social History Narrative   Currently in school for business, entrepreneurship    No current facility-administered medications on file prior to encounter.    Current Outpatient Medications on File Prior to Encounter  Medication Sig Dispense Refill  . clotrimazole (LOTRIMIN) 1 % cream Apply 1 application topically 2 (two)  times daily. 30 g 0  . docusate sodium (COLACE) 100 MG capsule Take 1 capsule (100 mg total) by mouth 2 (two) times daily as needed. 30 capsule 2  . tamsulosin (FLOMAX) 0.4 MG CAPS capsule Take 1 capsule (0.4 mg total) by mouth daily for 3 days. 3 capsule 0  . oxyCODONE-acetaminophen (PERCOCET/ROXICET) 5-325 MG tablet Take 1 tablet by mouth every 6 (six) hours as needed for severe pain. 10 tablet 0  . polyethylene glycol powder (GLYCOLAX/MIRALAX) powder Take 17 g by mouth daily. 255 g 0  . Prenatal Vit-Fe Fumarate-FA (PRENATAL VITAMIN PLUS LOW IRON) 27-1 MG TABS Take 1 tablet by mouth daily. 60 tablet 3  . promethazine (PHENERGAN) 12.5 MG tablet Take 1 tablet (12.5 mg total) by mouth every 6 (six) hours as needed for nausea or vomiting. 20 tablet 0  . promethazine (PHENERGAN) 25 MG tablet Take 0.5-1 tablets (12.5-25 mg total) by  mouth every 6 (six) hours as needed for nausea. 30 tablet 0   No Known Allergies  ROS:  Review of Systems  Constitutional: Negative for chills, fatigue and fever.  HENT: Negative for sinus pressure.   Eyes: Negative for photophobia.  Respiratory: Negative for shortness of breath.   Cardiovascular: Negative for chest pain.  Gastrointestinal: Positive for abdominal pain, nausea and vomiting. Negative for constipation and diarrhea.  Genitourinary: Positive for flank pain. Negative for difficulty urinating, dysuria, frequency, pelvic pain, vaginal bleeding, vaginal discharge and vaginal pain.  Musculoskeletal: Negative for neck pain.  Neurological: Negative for dizziness, weakness and headaches.  Psychiatric/Behavioral: Negative.      I have reviewed patient's Past Medical Hx, Surgical Hx, Family Hx, Social Hx, medications and allergies.   Physical Exam   Patient Vitals for the past 24 hrs:  BP Temp Resp Height Weight  08/25/17 2101 (!) 129/95 - - - -  08/25/17 2058 - 98.4 F (36.9 C) 19 '5\' 5"'$  (1.651 m) 106.6 kg   Constitutional: Well-developed, well-nourished female in no acute distress.  Cardiovascular: normal rate Respiratory: normal effort GI: Abd soft, non-tender. Pos BS x 4 MS: Extremities nontender, no edema, normal ROM Neurologic: Alert and oriented x 4.  GU: Neg CVAT.  PELVIC EXAM: Cervix pink, visually closed, without lesion, scant white creamy discharge, vaginal walls and external genitalia normal Bimanual exam: Cervix 0/long/high, firm, anterior, neg CMT, uterus nontender, nonenlarged, adnexa without tenderness, enlargement, or mass  FHT 160 by doppler  LAB RESULTS   A/Positive/-- (08/12 1104)  Results for orders placed or performed during the hospital encounter of 08/25/17 (from the past 72 hour(s))  Urinalysis, Routine w reflex microscopic     Status: Abnormal   Collection Time: 08/25/17  9:25 PM  Result Value Ref Range   Color, Urine YELLOW YELLOW    APPearance HAZY (A) CLEAR   Specific Gravity, Urine 1.018 1.005 - 1.030   pH 5.0 5.0 - 8.0   Glucose, UA NEGATIVE NEGATIVE mg/dL   Hgb urine dipstick LARGE (A) NEGATIVE   Bilirubin Urine NEGATIVE NEGATIVE   Ketones, ur NEGATIVE NEGATIVE mg/dL   Protein, ur 30 (A) NEGATIVE mg/dL   Nitrite NEGATIVE NEGATIVE   Leukocytes, UA NEGATIVE NEGATIVE   RBC / HPF >50 (H) 0 - 5 RBC/hpf   WBC, UA 6-10 0 - 5 WBC/hpf   Bacteria, UA NONE SEEN NONE SEEN   Mucus PRESENT    Ca Oxalate Crys, UA PRESENT     Comment: Performed at [facility], [address] [place], [place] [address]  CBC with  Differential/Platelet     Status: Abnormal   Collection Time: 08/25/17 10:23 PM  Result Value Ref Range   WBC 15.4 (H) 4.0 - 10.5 K/uL   RBC 4.47 3.87 - 5.11 MIL/uL   Hemoglobin 12.6 12.0 - 15.0 g/dL   HCT 36.5 36.0 - 46.0 %   MCV 81.7 78.0 - 100.0 fL   MCH 28.2 26.0 - 34.0 pg   MCHC 34.5 30.0 - 36.0 g/dL   RDW 15.0 11.5 - 15.5 %   Platelets 313 150 - 400 K/uL   Neutrophils Relative % 69 %   Neutro Abs 10.7 (H) 1.7 - 7.7 K/uL   Lymphocytes Relative 26 %   Lymphs Abs 4.0 0.7 - 4.0 K/uL   Monocytes Relative 4 %   Monocytes Absolute 0.6 0.1 - 1.0 K/uL   Eosinophils Relative 1 %   Eosinophils Absolute 0.1 0.0 - 0.7 K/uL   Basophils Relative 0 %   Basophils Absolute 0.0 0.0 - 0.1 K/uL    Comment: Performed at [facility], [address], [place], [place] [address]  Basic metabolic panel     Status: None   Collection Time: 08/25/17 10:23 PM  Result Value Ref Range   Sodium 137 135 - 145 mmol/L   Potassium 3.7 3.5 - 5.1 mmol/L   Chloride 103 98 - 111 mmol/L   CO2 24 22 - 32 mmol/L   Glucose, Bld 85 70 - 99 mg/dL   BUN 10 6 - 20 mg/dL   Creatinine, Ser 0.89 0.44 - 1.00 mg/dL   Calcium 9.5 8.9 - 10.3 mg/dL   GFR calc non Af Amer >60 >60 mL/min   GFR calc Af Amer >60 >60 mL/min    Comment: (NOTE) The eGFR has been calculated using the CKD EPI equation. This calculation has not been validated in  all clinical situations. eGFR's persistently <60 mL/min signify possible Chronic Kidney Disease.    Anion gap 10 5 - 15    Comment: Performed at [facility], [address] [place], [place] [address]  Lipase, blood     Status: None   Collection Time: 08/25/17 10:23 PM  Result Value Ref Range   Lipase 25 11 - 51 U/L    Comment: Performed at [facility], [address] [place], [place] [address]  IMAGING Mr Abdomen Wo Contrast  Result Date: 08/26/2017 CLINICAL DATA:  27 year old pregnant female with abdominal pain, nausea, dysuria and vomiting. EXAM: MRI ABDOMEN WITHOUT CONTRAST TECHNIQUE: Multiplanar multisequence MR imaging was performed without the administration of intravenous contrast. COMPARISON:  08/25/2017 abdominal and renal sonograms. FINDINGS: Lower chest: No acute abnormality at the lung bases. Hepatobiliary: Normal liver size and configuration. No liver mass. Normal gallbladder with no cholelithiasis. No biliary ductal dilatation. Common bile duct diameter 3 mm. No choledocholithiasis. Pancreas: No pancreatic mass or duct dilation.  No pancreas divisum. Spleen: Normal size. No mass. Adrenals/Urinary Tract: Normal adrenals. No hydronephrosis. Normal kidneys with no renal mass. Stomach/Bowel: Normal non-distended stomach. Visualized small and large bowel is normal caliber, with no bowel wall thickening. Candidate normal appendix in the right lower quadrant (series 5/image 12). No pericecal inflammatory changes. Vascular/Lymphatic: Normal caliber abdominal aorta. No pathologically enlarged lymph nodes in the abdomen. Other: No abdominal ascites or focal fluid collection. Gravid uterus is noted. This study is not tailored for gestational evaluation. Amniotic fluid volume appears normal. No evidence of perigestational bleed. Musculoskeletal: No aggressive appearing focal osseous lesions. IMPRESSION: No acute abnormality.  No evidence of acute appendicitis. Electronically Signed  By:  Ilona Sorrel M.D.   On: 08/26/2017 07:11   US Renal  Result Date: 08/25/2017 CLINICAL DATA:  RIGHT-sided abdominal pain.  Twelve weeks pregnant. EXAM: RENAL / URINARY TRACT ULTRASOUND COMPLETE COMPARISON:  None. FINDINGS: Right Kidney: Length: 12.6 cm. Echogenicity within normal limits. No mass or hydronephrosis visualized. Left Kidney: Length: 11.4 cm. Echogenicity within normal limits. No mass or hydronephrosis visualized. Bladder: Appears normal for degree of bladder distention. IMPRESSION: Normal renal ultrasound. Electronically Signed   By: Franki Cabot M.D.   On: 08/25/2017 23:29   US Renal  Result Date: 08/24/2017 CLINICAL DATA:  27 y/o F; right flank pain. Thirteen weeks pregnant. EXAM: RENAL / URINARY TRACT ULTRASOUND COMPLETE COMPARISON:  None. FINDINGS: Right Kidney: Length: 12.3 cm. Echogenicity within normal limits. No mass identified. Mild right hydronephrosis. Left Kidney: Length: 13.4 cm. Echogenicity within normal limits. No mass or hydronephrosis visualized. Bladder: Appears normal for degree of bladder distention. Right ureteral jet noted. IMPRESSION: Mild right hydronephrosis. Electronically Signed   By: Kristine Garbe M.D.   On: 08/24/2017 20:50   US Abdomen Limited Ruq  Result Date: 08/25/2017 CLINICAL DATA:  Pregnant patient at [redacted] weeks gestation with right upper and right lower quadrant pain. EXAM: ULTRASOUND ABDOMEN LIMITED RIGHT UPPER QUADRANT COMPARISON:  None. FINDINGS: Gallbladder: Physiologically distended, fold in the gallbladder fundus. No gallstones or wall thickening visualized. No sonographic Murphy sign noted by sonographer. Common bile duct: Diameter: 1.7 mm, normal. Liver: No focal lesion identified. Within normal limits in parenchymal echogenicity. Portal vein is patent on color Doppler imaging with normal direction of blood flow towards the liver. IMPRESSION: Normal right upper quadrant ultrasound. Normal sonographic appearance of the liver, gallbladder,  and biliary tree. Electronically Signed   By: Jeb Levering M.D.   On: 08/25/2017 23:29    MAU Management/MDM: Consult Dr Ilda Basset with pt presentation and exam.  Repeat labs with CBC with diff, BMP, add lipase and repeat renal US.  Also with new onset RUQ pain add RUQ [place].  No abnormalities with these results so consult Dr Ilda Basset with results.  MRI ordered to evaluate appendix and kidneys.  Pt transported to [facility] for MRI by [facility] and transported back to [facility].  Results of MRI are normal.  Dr Ilda Basset to bedside to discuss results and plan of care with pt.  Reassurance provided that no acute abnormalities found on exams or imaging. Continue to manage pain, add Dilaudid PO for pain management.  Pt to f/u in office, return to MAU for emergencies.    ASSESSMENT 1. Abdominal pain during pregnancy in first trimester   2. Supervision of normal first pregnancy, antepartum   3. Right flank pain   4. Right sided abdominal pain   5. Other microscopic hematuria    PLAN Discharge home  Allergies as of 08/26/2017   No Known Allergies     Medication List    STOP taking these medications   oxyCODONE-acetaminophen 5-325 MG tablet Commonly known as:  PERCOCET/ROXICET     TAKE these medications   clotrimazole 1 % cream Commonly known as:  LOTRIMIN Apply 1 application topically 2 (two) times daily.   docusate sodium 100 MG capsule Commonly known as:  COLACE Take 1 capsule (100 mg total) by mouth 2 (two) times daily as needed.   polyethylene glycol powder powder Commonly known as:  GLYCOLAX/MIRALAX Take 17 g by mouth daily.   PRENATAL VITAMIN PLUS LOW IRON 27-1 MG Tabs Take 1 tablet by mouth daily.  promethazine 12.5 MG tablet Commonly known as:  PHENERGAN Take 1 tablet (12.5 mg total) by mouth every 6 (six) hours as needed for nausea or vomiting.        Fatima Blank Certified Nurse-Midwife 08/25/2017  9:33 PM

## 2017-08-26 ENCOUNTER — Encounter (HOSPITAL_COMMUNITY): Payer: Self-pay | Admitting: *Deleted

## 2017-08-26 ENCOUNTER — Other Ambulatory Visit: Payer: Self-pay

## 2017-08-26 ENCOUNTER — Inpatient Hospital Stay (HOSPITAL_COMMUNITY)
Admission: AD | Admit: 2017-08-26 | Discharge: 2017-08-27 | Disposition: A | Discharge status: Home / Self Care | Payer: BLUE CROSS/BLUE SHIELD | Source: Ambulatory Visit | Attending: Obstetrics & Gynecology | Admitting: Obstetrics & Gynecology

## 2017-08-26 ENCOUNTER — Inpatient Hospital Stay (HOSPITAL_COMMUNITY): Payer: BLUE CROSS/BLUE SHIELD

## 2017-08-26 DIAGNOSIS — Z3A12 12 weeks gestation of pregnancy: Secondary | ICD-10-CM

## 2017-08-26 DIAGNOSIS — R109 Unspecified abdominal pain: Secondary | ICD-10-CM | POA: Diagnosis not present

## 2017-08-26 DIAGNOSIS — M549 Dorsalgia, unspecified: Secondary | ICD-10-CM

## 2017-08-26 DIAGNOSIS — O26891 Other specified pregnancy related conditions, first trimester: Secondary | ICD-10-CM

## 2017-08-26 DIAGNOSIS — F12188 Cannabis abuse with other cannabis-induced disorder: Secondary | ICD-10-CM

## 2017-08-26 LAB — COMPREHENSIVE METABOLIC PANEL
ALBUMIN: 4.2 g/dL (ref 3.5–5.0)
ALT: 12 U/L (ref 0–44)
AST: 16 U/L (ref 15–41)
Alkaline Phosphatase: 50 U/L (ref 38–126)
Anion gap: 13 (ref 5–15)
BUN: 7 mg/dL (ref 6–20)
CO2: 22 mmol/L (ref 22–32)
CREATININE: 0.84 mg/dL (ref 0.44–1.00)
Calcium: 9.7 mg/dL (ref 8.9–10.3)
Chloride: 102 mmol/L (ref 98–111)
GFR calc Af Amer: >60 mL/min (ref >60)
GFR calc non Af Amer: >60 mL/min (ref >60)
GLUCOSE: 103 mg/dL — AB (ref 70–99)
POTASSIUM: 3.9 mmol/L (ref 3.5–5.1)
Sodium: 137 mmol/L (ref 135–145)
TOTAL PROTEIN: 7 g/dL (ref 6.5–8.1)
Total Bilirubin: 0.8 mg/dL (ref 0.3–1.2)

## 2017-08-26 LAB — URINALYSIS, ROUTINE W REFLEX MICROSCOPIC
BILIRUBIN URINE: NEGATIVE
GLUCOSE, UA: NEGATIVE mg/dL
KETONES UR: NEGATIVE mg/dL
NITRITE: NEGATIVE
PH: 7 (ref 5.0–8.0)
Protein, ur: NEGATIVE mg/dL
SPECIFIC GRAVITY, URINE: 1.012 (ref 1.005–1.030)

## 2017-08-26 LAB — CBC WITH DIFFERENTIAL/PLATELET
BASOS ABS: 0 10*3/uL (ref 0.0–0.1)
BASOS PCT: 0 %
Eosinophils Absolute: 0.1 10*3/uL (ref 0.0–0.7)
Eosinophils Relative: 0 %
HEMATOCRIT: 36.4 % (ref 36.0–46.0)
HEMOGLOBIN: 12.5 g/dL (ref 12.0–15.0)
LYMPHS PCT: 18 %
Lymphs Abs: 2.6 10*3/uL (ref 0.7–4.0)
MCH: 28 pg (ref 26.0–34.0)
MCHC: 34.3 g/dL (ref 30.0–36.0)
MCV: 81.6 fL (ref 78.0–100.0)
MONO ABS: 0.3 10*3/uL (ref 0.1–1.0)
Monocytes Relative: 2 %
NEUTROS ABS: 12.1 10*3/uL — AB (ref 1.7–7.7)
NEUTROS PCT: 80 %
Platelets: 316 10*3/uL (ref 150–400)
RBC: 4.46 MIL/uL (ref 3.87–5.11)
RDW: 15 % (ref 11.5–15.5)
WBC: 15.1 10*3/uL — AB (ref 4.0–10.5)

## 2017-08-26 LAB — RAPID URINE DRUG SCREEN, HOSP PERFORMED
Amphetamines: NOT DETECTED
BARBITURATES: NOT DETECTED
Benzodiazepines: NOT DETECTED
Cocaine: NOT DETECTED
Opiates: POSITIVE — AB
TETRAHYDROCANNABINOL: POSITIVE — AB

## 2017-08-26 MED ORDER — HYDROMORPHONE HCL 2 MG PO TABS
4.0000 mg | ORAL_TABLET | Freq: Once | ORAL | Status: AC
  Administered 2017-08-26: 4 mg via ORAL
  Filled 2017-08-26 (×2): qty 2

## 2017-08-26 MED ORDER — HYDROMORPHONE HCL 1 MG/ML IJ SOLN: 1.0000 mg | Freq: Once | INTRAMUSCULAR | Status: DC

## 2017-08-26 MED ORDER — LACTATED RINGERS IV BOLUS
1000.0000 mL | Freq: Once | INTRAVENOUS | Status: AC
  Administered 2017-08-26: 1000 mL via INTRAVENOUS

## 2017-08-26 MED ORDER — ONDANSETRON HCL 4 MG/2ML IJ SOLN
4.0000 mg | Freq: Once | INTRAMUSCULAR | Status: AC
  Administered 2017-08-26: 4 mg via INTRAVENOUS
  Filled 2017-08-26: qty 2

## 2017-08-26 MED ORDER — FAMOTIDINE IN NACL 20-0.9 MG/50ML-% IV SOLN
20.0000 mg | Freq: Once | INTRAVENOUS | Status: AC
  Administered 2017-08-26: 20 mg via INTRAVENOUS
  Filled 2017-08-26: qty 50

## 2017-08-26 MED ORDER — HYDROMORPHONE HCL 1 MG/ML IJ SOLN
1.0000 mg | Freq: Once | INTRAMUSCULAR | Status: AC
  Administered 2017-08-26: 1 mg via INTRAVENOUS
  Filled 2017-08-26: qty 1

## 2017-08-26 MED ORDER — HALOPERIDOL LACTATE 5 MG/ML IJ SOLN
5.0000 mg | Freq: Once | INTRAMUSCULAR | Status: AC
  Administered 2017-08-26: 5 mg via INTRAVENOUS
  Filled 2017-08-26: qty 1

## 2017-08-26 MED ORDER — CYCLOBENZAPRINE HCL 10 MG PO TABS
10.0000 mg | ORAL_TABLET | Freq: Once | ORAL | Status: AC
  Administered 2017-08-26: 10 mg via ORAL
  Filled 2017-08-26: qty 1

## 2017-08-26 MED ORDER — HYDROMORPHONE HCL 2 MG PO TABS: 2.0000 mg | ORAL_TABLET | ORAL | 0 refills | Status: DC | PRN

## 2017-08-26 MED ORDER — DICYCLOMINE HCL 10 MG/ML IM SOLN
20.0000 mg | Freq: Once | INTRAMUSCULAR | Status: AC
  Administered 2017-08-26: 20 mg via INTRAMUSCULAR
  Filled 2017-08-26: qty 2

## 2017-08-26 NOTE — Progress Notes (Signed)
Dr Vergie LivingPickens in to see pt

## 2017-08-26 NOTE — MAU Note (Addendum)
Pt here for pain in stomach and back that started 3 days ago. Pt states she has been here for the past 3 days. Was told they think its a kidney stone but the patient thinks she is constipated. Pt states she has not had a BM in days. Pt has had nausea and vomiting. Pt states she was given RX for oxycodone- last took at 6pm and not helping.

## 2017-08-26 NOTE — Therapy (Signed)
[facility] and pt OK to come. [facility] called. [facility] made aware pt coming directly to MRI. (Just making ED aware pt will be in the building but not coming thru ED)

## 2017-08-26 NOTE — MAU Note (Signed)
Instructed pt on positioning, process and waiting period for soap suds enema. After instilling 750cc rectally, pt said she couldn't lie on her side any longer and she sat up in bed. Left pt with bedside commode and toilet tissue and instructions to hold enema for the next 10 min if possible .

## 2017-08-26 NOTE — Progress Notes (Addendum)
MAU Note Patient sitting comfortably in bed with head propped on hand. D/w pt prelim MRI negative for GI or renal process although can't 100% exclude renal stone but given nothing seen on u/s or MRI with negative BMP, if there is a stone then not clinically significant enough to be causing renal dysfunction. UCx negative and AF VS normal and stable. I told her that given this to continue with outpatient management and no indication for inpatient admission. Patient not wanting to do PO dilaudid but I told her that this may work better for her pain and that she can't drive while on narcotics. Pt would like to try PO dilaudid now and I'll let the RN know but patient is fine for discharge now.  Tamara Bryant, Jr MD Attending [facility] 08/26/2017 Time: [id number]

## 2017-08-26 NOTE — MAU Provider Note (Signed)
Chief Complaint: Abdominal Pain and Back Pain   First Provider Initiated Contact with Patient 08/26/17 2032     SUBJECTIVE HPI: Tamara Bryant is a 27 y.o. G1P0 at [redacted]w[redacted]d who presents to Maternity Admissions reporting n/v, abdominal pain, & back pain. This is her 3rd MAU visit in 3 days. Her work up has included labs, 2 renal ultrasound, an abdominal ultrasound, & an MRI; all of which were normal.  States pain has continued without relief. Pain worse in right flank and radiates to RLQ. Pain associated with n/v. States she smokes marijuana "all the time" up until 2 days ago.  Denies fever/chills, dysuria, hematuria, urinary frequency, vaginal bleeding, or vaginal discharge.  Last BM was last Tuesday. Was prescribed miralax but didn't get it filled so hasn't treated constipation.   Location: abdomen & back Quality: pressure & sharp Severity: 10/10 on pain scale Duration: 3 days Timing: constant Modifying factors: nothing makes better or worse. Percocet did not help pain.  Associated signs and symptoms: n/v & constipation  Past Medical History:  Diagnosis Date  . Asthma   . History of tobacco use   . Marijuana use   . Obesity   . Seasonal allergies    OB History  Gravida Para Term Preterm AB Living  1            SAB TAB Ectopic Multiple Live Births               # Outcome Date GA Lbr Len/2nd Weight Sex Delivery Anes PTL Lv  1 Current            Past Surgical History:  Procedure Laterality Date  . WISDOM TOOTH EXTRACTION     Social History   Socioeconomic History  . Marital status: Single    Spouse name: Not on file  . Number of children: Not on file  . Years of education: Not on file  . Highest education level: Not on file  Occupational History  . Not on file  Social Needs  . Financial resource strain: Not on file  . Food insecurity:    Worry: Not on file    Inability: Not on file  . Transportation needs:    Medical: Not on file    Non-medical: Not on file   Tobacco Use  . Smoking status: Former Smoker    Packs/day: 1.00  . Smokeless tobacco: Never Used  . Tobacco comment: 07/06/2017  Substance and Sexual Activity  . Alcohol use: Yes    Comment: last 07/05  . Drug use: Yes    Types: Marijuana    Comment: LAST SMOKED ON TUESDAY 08-21-2017  . Sexual activity: Yes    Birth control/protection: None  Lifestyle  . Physical activity:    Days per week: Not on file    Minutes per session: Not on file  . Stress: Not on file  Relationships  . Social connections:    Talks on phone: Not on file    Gets together: Not on file    Attends religious service: Not on file    Active member of club or organization: Not on file    Attends meetings of clubs or organizations: Not on file    Relationship status: Not on file  . Intimate partner violence:    Fear of current or ex partner: Not on file    Emotionally abused: Not on file    Physically abused: Not on file    Forced sexual activity: Not on file  Other Topics Concern  . Not on file  Social History Narrative   Currently in school for business, entrepreneurship    Family History  Problem Relation Age of Onset  . Hypertension Mother   . Sickle cell anemia Mother   . Diabetes Father   . Hypertension Father   . Diabetes Maternal Grandmother   . Diabetes Paternal Grandfather    No current facility-administered medications on file prior to encounter.    Current Outpatient Medications on File Prior to Encounter  Medication Sig Dispense Refill  . clotrimazole (LOTRIMIN) 1 % cream Apply 1 application topically 2 (two) times daily. 30 g 0  . docusate sodium (COLACE) 100 MG capsule Take 1 capsule (100 mg total) by mouth 2 (two) times daily as needed. 30 capsule 2  . Prenatal Vit-Fe Fumarate-FA (PRENATAL VITAMIN PLUS LOW IRON) 27-1 MG TABS Take 1 tablet by mouth daily. 60 tablet 3  . promethazine (PHENERGAN) 12.5 MG tablet Take 1 tablet (12.5 mg total) by mouth every 6 (six) hours as needed for  nausea or vomiting. 20 tablet 0  . polyethylene glycol powder (GLYCOLAX/MIRALAX) powder Take 17 g by mouth daily. 255 g 0   No Known Allergies  I have reviewed patient's Past Medical Hx, Surgical Hx, Family Hx, Social Hx, medications and allergies.   Review of Systems  Constitutional: Negative.   Gastrointestinal: Positive for abdominal pain, constipation, nausea and vomiting. Negative for diarrhea.  Genitourinary: Positive for flank pain. Negative for dysuria, frequency, hematuria, vaginal bleeding and vaginal discharge.  Musculoskeletal: Positive for back pain.    OBJECTIVE Patient Vitals for the past 24 hrs:  BP Temp Pulse Resp SpO2 Height Weight  08/27/17 0011 136/87 - 89 16 - - -  08/26/17 2001 113/69 98.5 F (36.9 C) 96 - 100 % 5\' 5"  (1.651 m) 107.5 kg   Constitutional: Well-developed, well-nourished female in no acute distress.  Cardiovascular: normal rate & rhythm, no murmur Respiratory: normal rate and effort. Lung sounds clear throughout GI: Abd soft, non-tender, Pos BS x 4. No guarding or rebound tenderness MS: Extremities nontender, no edema, normal ROM Neurologic: Alert and oriented x 4.  GU:  Dilation: Closed Effacement (%): Thick Cervical Position: Posterior Exam by:: Estanislado Spire NP    LAB RESULTS Results for orders placed or performed during the hospital encounter of 08/26/17 (from the past 24 hour(s))  Urinalysis, Routine w reflex microscopic     Status: Abnormal   Collection Time: 08/26/17  7:53 PM  Result Value Ref Range   Color, Urine YELLOW YELLOW   APPearance CLOUDY (A) CLEAR   Specific Gravity, Urine 1.012 1.005 - 1.030   pH 7.0 5.0 - 8.0   Glucose, UA NEGATIVE NEGATIVE mg/dL   Hgb urine dipstick MODERATE (A) NEGATIVE   Bilirubin Urine NEGATIVE NEGATIVE   Ketones, ur NEGATIVE NEGATIVE mg/dL   Protein, ur NEGATIVE NEGATIVE mg/dL   Nitrite NEGATIVE NEGATIVE   Leukocytes, UA TRACE (A) NEGATIVE   RBC / HPF 21-50 0 - 5 RBC/hpf   WBC, UA 11-20 0 - 5  WBC/hpf   Bacteria, UA RARE (A) NONE SEEN   Squamous Epithelial / LPF 6-10 0 - 5   Mucus PRESENT   Rapid urine drug screen (hospital performed)     Status: Abnormal   Collection Time: 08/26/17  7:53 PM  Result Value Ref Range   Opiates POSITIVE (A) NONE DETECTED   Cocaine NONE DETECTED NONE DETECTED   Benzodiazepines NONE DETECTED NONE DETECTED   Amphetamines NONE  DETECTED NONE DETECTED   Tetrahydrocannabinol POSITIVE (A) NONE DETECTED   Barbiturates NONE DETECTED NONE DETECTED  CBC with Differential/Platelet     Status: Abnormal   Collection Time: 08/26/17  8:50 PM  Result Value Ref Range   WBC 15.1 (H) 4.0 - 10.5 K/uL   RBC 4.46 3.87 - 5.11 MIL/uL   Hemoglobin 12.5 12.0 - 15.0 g/dL   HCT 81.136.4 91.436.0 - 78.246.0 %   MCV 81.6 78.0 - 100.0 fL   MCH 28.0 26.0 - 34.0 pg   MCHC 34.3 30.0 - 36.0 g/dL   RDW 95.615.0 21.311.5 - 08.615.5 %   Platelets 316 150 - 400 K/uL   Neutrophils Relative % 80 %   Neutro Abs 12.1 (H) 1.7 - 7.7 K/uL   Lymphocytes Relative 18 %   Lymphs Abs 2.6 0.7 - 4.0 K/uL   Monocytes Relative 2 %   Monocytes Absolute 0.3 0.1 - 1.0 K/uL   Eosinophils Relative 0 %   Eosinophils Absolute 0.1 0.0 - 0.7 K/uL   Basophils Relative 0 %   Basophils Absolute 0.0 0.0 - 0.1 K/uL  Comprehensive metabolic panel     Status: Abnormal   Collection Time: 08/26/17  8:50 PM  Result Value Ref Range   Sodium 137 135 - 145 mmol/L   Potassium 3.9 3.5 - 5.1 mmol/L   Chloride 102 98 - 111 mmol/L   CO2 22 22 - 32 mmol/L   Glucose, Bld 103 (H) 70 - 99 mg/dL   BUN 7 6 - 20 mg/dL   Creatinine, Ser 5.780.84 0.44 - 1.00 mg/dL   Calcium 9.7 8.9 - 46.910.3 mg/dL   Total Protein 7.0 6.5 - 8.1 g/dL   Albumin 4.2 3.5 - 5.0 g/dL   AST 16 15 - 41 U/L   ALT 12 0 - 44 U/L   Alkaline Phosphatase 50 38 - 126 U/L   Total Bilirubin 0.8 0.3 - 1.2 mg/dL   GFR calc non Af Amer >60 >60 mL/min   GFR calc Af Amer >60 >60 mL/min   Anion gap 13 5 - 15    IMAGING Mr Abdomen Wo Contrast  Result Date: 08/26/2017 CLINICAL  DATA:  27 year old pregnant female with abdominal pain, nausea, dysuria and vomiting. EXAM: MRI ABDOMEN WITHOUT CONTRAST TECHNIQUE: Multiplanar multisequence MR imaging was performed without the administration of intravenous contrast. COMPARISON:  08/25/2017 abdominal and renal sonograms. FINDINGS: Lower chest: No acute abnormality at the lung bases. Hepatobiliary: Normal liver size and configuration. No liver mass. Normal gallbladder with no cholelithiasis. No biliary ductal dilatation. Common bile duct diameter 3 mm. No choledocholithiasis. Pancreas: No pancreatic mass or duct dilation.  No pancreas divisum. Spleen: Normal size. No mass. Adrenals/Urinary Tract: Normal adrenals. No hydronephrosis. Normal kidneys with no renal mass. Stomach/Bowel: Normal non-distended stomach. Visualized small and large bowel is normal caliber, with no bowel wall thickening. Candidate normal appendix in the right lower quadrant (series 5/image 12). No pericecal inflammatory changes. Vascular/Lymphatic: Normal caliber abdominal aorta. No pathologically enlarged lymph nodes in the abdomen. Other: No abdominal ascites or focal fluid collection. Gravid uterus is noted. This study is not tailored for gestational evaluation. Amniotic fluid volume appears normal. No evidence of perigestational bleed. Musculoskeletal: No aggressive appearing focal osseous lesions. IMPRESSION: No acute abnormality.  No evidence of acute appendicitis. Electronically Signed   By: Delbert PhenixJason A Poff M.D.   On: 08/26/2017 07:11    MAU COURSE Orders Placed This Encounter  Procedures  . Culture, OB Urine  . Urinalysis, Routine w  reflex microscopic  . Rapid urine drug screen (hospital performed)  . CBC with Differential/Platelet  . Comprehensive metabolic panel  . Soap suds enema  . Discharge patient   Meds ordered this encounter  Medications  . lactated ringers bolus 1,000 mL  . haloperidol lactate (HALDOL) injection 5 mg  . famotidine (PEPCID) IVPB 20  mg premix  . ondansetron (ZOFRAN) injection 4 mg  . dicyclomine (BENTYL) injection 20 mg  . cyclobenzaprine (FLEXERIL) tablet 10 mg  . cyclobenzaprine (FLEXERIL) 5 MG tablet    Sig: Take 1 tablet (5 mg total) by mouth 3 (three) times daily as needed for muscle spasms.    Dispense:  20 tablet    Refill:  0    Order Specific Question:   Supervising Provider    Answer:   Despina Hidden, LUTHER H [[id number]]    MDM FHT 169 by doppler & cervix closed/thick U/a, CBC, & CMP normal. Elevated WBC comparable to last 2 results. Pt afebrile.  Normal MRI early this morning IV fluids, zofran, haldol, & pepcid given. Pt no longer vomiting or yelling. Pt requesting enema for constipation and abdominal pressure. Soap suds enema with good result & pt reports no further abdominal pain.  Back pain continues. Flexeril given & pt reports improvement. Requesting to be discharged home.  Recommend discontinuing marijuana use.   ASSESSMENT 1. 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 [redacted] weeks gestation of pregnancy   3. Musculoskeletal back pain     PLAN Discharge home in stable condition.   Allergies as of 08/27/2017   No Known Allergies     Medication List    STOP taking these medications   HYDROmorphone 2 MG tablet Commonly known as:  DILAUDID   tamsulosin 0.4 MG Caps capsule Commonly known as:  FLOMAX     TAKE these medications   clotrimazole 1 % cream Commonly known as:  LOTRIMIN Apply 1 application topically 2 (two) times daily.   cyclobenzaprine 5 MG tablet Commonly known as:  FLEXERIL Take 1 tablet (5 mg total) by mouth 3 (three) times daily as needed for muscle spasms.   docusate sodium 100 MG capsule Commonly known as:  COLACE Take 1 capsule (100 mg total) by mouth 2 (two) times daily as needed.   polyethylene glycol powder powder Commonly known as:  GLYCOLAX/MIRALAX Take 17 g by mouth daily.   PRENATAL VITAMIN PLUS LOW IRON 27-1 MG Tabs Take 1  tablet by mouth daily.   promethazine 12.5 MG tablet Commonly known as:  PHENERGAN Take 1 tablet (12.5 mg total) by mouth every 6 (six) hours as needed for nausea or vomiting. What changed:  Another medication with the same name was removed. Continue taking this medication, and follow the directions you see here.        Judeth Horn, NP 08/27/2017  12:31 AM

## 2017-08-26 NOTE — MAU Note (Signed)
Pt refused po DIlaudid and upset no one has come to explain MRI results. Pt was aware earlier that her MRI was normal but provider would see her to explain results and plan of care.

## 2017-08-26 NOTE — Progress Notes (Signed)
Pain was 7 at d/c home at 0550

## 2017-08-26 NOTE — MAU Note (Signed)
IV saline locked before transfer

## 2017-08-27 ENCOUNTER — Other Ambulatory Visit: Payer: BLUE CROSS/BLUE SHIELD

## 2017-08-27 DIAGNOSIS — O3680X0 Pregnancy with inconclusive fetal viability, not applicable or unspecified: Secondary | ICD-10-CM

## 2017-08-27 MED ORDER — CYCLOBENZAPRINE HCL 5 MG PO TABS: 5.0000 mg | ORAL_TABLET | Freq: Three times a day (TID) | ORAL | 0 refills | Status: DC | PRN

## 2017-08-27 NOTE — Discharge Instructions (Signed)
Cannabinoid Hyperemesis Syndrome °Cannabinoid hyperemesis syndrome (CHS) is a condition that causes repeated nausea, vomiting, and abdominal pain after long-term (chronic) use of marijuana (cannabis). People with CHS typically use marijuana 3-5 times a day for many years before they have symptoms, although it is possible to develop CHS with as little as 1 use per day. °Symptoms of CHS may be mild at first but can get worse and more frequent. In some cases, CHS may cause vomiting many times a day, which can lead to weight loss and dehydration. CHS may go away and come back many times (recur). People may not have symptoms or may otherwise be healthy in between CHS attacks. °What are the causes? °The exact cause of this condition is not known. Long-term use of marijuana may over-stimulate certain proteins in the brain that react with chemicals in marijuana (cannabinoid receptors). This over-stimulation may cause CHS. °What are the signs or symptoms? °Symptoms of this condition are often mild during the first few attacks, but they can get worse over time. Symptoms may include: °· Frequent nausea, especially early in the morning. °· Vomiting. °· Abdominal pain. ° °Taking several hot showers throughout the day can also be a sign of this condition. People with CHS may do this because it relieves symptoms. °How is this diagnosed? °This condition may be diagnosed based on: °· Your symptoms and medical history, including any drug use. °· A physical exam. ° °You may have tests done to rule out other problems. These tests may include: °· Blood tests. °· Urine tests. °· Imaging tests, such as an X-ray or CT scan. ° °How is this treated? °Treatment for this condition involves stopping marijuana use. Your health care provider may recommend: °· A drug rehabilitation program, if you have trouble stopping marijuana use. °· Medicines for nausea. °· Hot showers to help relieve symptoms. ° °Certain creams that contain a substance called  capsaicin may improve symptoms when applied to the abdomen. Ask your health care provider before starting any medicines or other treatments. °Severe nausea and vomiting may require you to stay at the hospital. You may need IV fluids to prevent or treat dehydration. You may also need certain medicines that must be given at the hospital. °Follow these instructions at home: °During an attack °· Stay in bed and rest in a dark, quiet room. °· Take anti-nausea medicine as told by your health care provider. °· Try taking hot showers to relieve your symptoms. °After an attack °· Drink small amounts of clear fluids slowly. Gradually add more. °· Once you are able to eat without vomiting, eat soft foods in small amounts every 3-4 hours. °General instructions °· Do not use any products that contain marijuana.If you need help quitting, ask your health care provider for resources and treatment options. °· Drink enough fluid to keep your urine pale yellow. Avoid drinking fluids that have a lot of sugar or caffeine, such as coffee and soda. °· Take and apply over-the-counter and prescription medicines only as told by your health care provider. Ask your health care provider before starting any new medicines or treatments. °· Keep all follow-up visits as told by your health care provider. This is important. °Contact a health care provider if: °· Your symptoms get worse. °· You cannot drink fluids without vomiting. °· You have pain and trouble swallowing after an attack. °Get help right away if: °· You cannot stop vomiting. °· You have blood in your vomit or your vomit looks like coffee grounds. °·   You have severe abdominal pain. °· You have stools that are bloody or black, or stools that look like tar. °· You have symptoms of dehydration, such as: °? Sunken eyes. °? Inability to make tears. °? Cracked lips. °? Dry mouth. °? Decreased urine production. °? Weakness. °? Sleepiness. °? Fainting. °Summary °· Cannabinoid hyperemesis  syndrome (CHS) is a condition that causes repeated nausea, vomiting, and abdominal pain after long-term use of marijuana. °· People with CHS typically use marijuana 3-5 times a day for many years before they have symptoms, although it is possible to develop CHS with as little as 1 use per day. °· Treatment for this condition involves stopping marijuana use. Hot showers and capsaicin creams may also help relieve symptoms. Ask your health care provider before starting any medicines or other treatments. °· Your health care provider may prescribe medicines to help with nausea. °· Get help right away if you have signs of dehydration, such as dry mouth, decreased urine production, or weakness. °This information is not intended to replace advice given to you by your health care provider. Make sure you discuss any questions you have with your health care provider. °Document Released: 03/29/2016 Document Revised: 03/29/2016 Document Reviewed: 03/29/2016 °Elsevier Interactive Patient Education © 2018 [employer] ° °

## 2017-08-28 LAB — CULTURE, OB URINE

## 2017-08-28 LAB — VARICELLA-ZOSTER BY PCR: Varicella-Zoster, PCR: NEGATIVE

## 2017-09-07 ENCOUNTER — Encounter: Payer: Self-pay | Admitting: *Deleted

## 2017-09-10 ENCOUNTER — Encounter: Payer: Self-pay | Admitting: *Deleted

## 2017-09-11 ENCOUNTER — Encounter: Payer: Self-pay | Admitting: Obstetrics and Gynecology

## 2017-09-11 ENCOUNTER — Ambulatory Visit (INDEPENDENT_AMBULATORY_CARE_PROVIDER_SITE_OTHER): Payer: BLUE CROSS/BLUE SHIELD | Admitting: Obstetrics and Gynecology

## 2017-09-11 VITALS — BP 124/81 | HR 90 | Wt 230.6 lb

## 2017-09-11 DIAGNOSIS — Z23 Encounter for immunization: Secondary | ICD-10-CM

## 2017-09-11 DIAGNOSIS — F1290 Cannabis use, unspecified, uncomplicated: Secondary | ICD-10-CM

## 2017-09-11 DIAGNOSIS — Z3400 Encounter for supervision of normal first pregnancy, unspecified trimester: Secondary | ICD-10-CM

## 2017-09-11 DIAGNOSIS — Z3402 Encounter for supervision of normal first pregnancy, second trimester: Secondary | ICD-10-CM

## 2017-09-11 DIAGNOSIS — O99342 Other mental disorders complicating pregnancy, second trimester: Secondary | ICD-10-CM

## 2017-09-11 DIAGNOSIS — F329 Major depressive disorder, single episode, unspecified: Secondary | ICD-10-CM

## 2017-09-11 DIAGNOSIS — O219 Vomiting of pregnancy, unspecified: Secondary | ICD-10-CM | POA: Insufficient documentation

## 2017-09-11 DIAGNOSIS — R899 Unspecified abnormal finding in specimens from other organs, systems and tissues: Secondary | ICD-10-CM

## 2017-09-11 DIAGNOSIS — F12988 Cannabis use, unspecified with other cannabis-induced disorder: Secondary | ICD-10-CM

## 2017-09-11 DIAGNOSIS — F32A Depression, unspecified: Secondary | ICD-10-CM | POA: Insufficient documentation

## 2017-09-11 MED ORDER — RANITIDINE HCL 150 MG PO TABS: 150.0000 mg | ORAL_TABLET | Freq: Two times a day (BID) | ORAL | 1 refills | Status: DC

## 2017-09-11 MED ORDER — SERTRALINE HCL 50 MG PO TABS: 50.0000 mg | ORAL_TABLET | Freq: Every day | ORAL | 1 refills | Status: DC

## 2017-09-11 MED ORDER — METOCLOPRAMIDE HCL 10 MG PO TABS: 10.0000 mg | ORAL_TABLET | Freq: Three times a day (TID) | ORAL | 1 refills | Status: DC

## 2017-09-11 NOTE — Progress Notes (Signed)
   PRENATAL VISIT NOTE  Subjective:  Tamara Bryant is a 27 y.o. G1P0 at [redacted]w[redacted]d being seen today for ongoing prenatal care.  She is currently monitored for the following issues for this low-risk pregnancy and has Supervision of normal first pregnancy, antepartum; Obesity (BMI 30-39.9); Mild intermittent asthma; Marijuana use; History of chlamydia; Depression affecting pregnancy in second trimester, antepartum; Nausea and vomiting in pregnancy; and Cannabinoid hyperemesis syndrome (HCC) on their problem list.  Patient reports nausea and vomiting.  Contractions: Not present. Vag. Bleeding: None.  Movement: Absent. Denies leaking of fluid.  Depression: unplanned pregnancy, recently lost her grandparent. Feels more sad since finding out she is pregnant due to not feeling well enough to do things she has done in the past. No thoughts of harm to herself or her baby. Excited about the future.   The following portions of the patient's history were reviewed and updated as appropriate: allergies, current medications, past family history, past medical history, past social history, past surgical history and problem list. Problem list updated.  Objective:   Vitals:   09/11/17 0828  BP: 124/81  Pulse: 90  Weight: 230 lb 9.6 oz (104.6 kg)    Fetal Status: Fetal Heart Rate (bpm): 155   Movement: Absent     General:  Alert, oriented and cooperative. Patient is in no acute distress.  Skin: Skin is warm and dry. No rash noted.   Cardiovascular: Normal heart rate noted  Respiratory: Normal respiratory effort, no problems with respiration noted  Abdomen: Soft, gravid, appropriate for gestational age.  Pain/Pressure: Absent     Pelvic: Cervical exam deferred        Extremities: Normal range of motion.  Edema: None  Mental Status: Normal mood and affect. Normal behavior. Normal judgment and thought content.   Assessment and Plan:  Pregnancy: G1P0 at [redacted]w[redacted]d   1. Supervision of normal first pregnancy,  antepartum - Flu Vaccine QUAD 36+ mos IM (Fluarix, Quad PF)  2. 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  - Scheduled to see Asher Muir this week.   3. Nausea and vomiting in pregnancy - stop phenergan - Rx Reglan, Zantac. If depression symptoms worsen, please call the office for an appointment.   4. Cannabinoid hyperemesis syndrome (HCC) - Using much less, understands the reaction of N/v with marijuana use.   5. Marijuana use  Other orders - ranitidine (ZANTAC) 150 MG tablet; Take 1 tablet (150 mg total) by mouth 2 (two) times daily. - metoCLOPramide (REGLAN) 10 MG tablet; Take 1 tablet (10 mg total) by mouth 4 (four) times daily -  before meals and at bedtime. - sertraline (ZOLOFT) 50 MG tablet; Take 1 tablet (50 mg total) by mouth daily.  There are no diagnoses linked to this encounter. Preterm labor symptoms and general obstetric precautions including but not limited to vaginal bleeding, contractions, leaking of fluid and fetal movement were reviewed in detail with the patient. Please refer to After Visit Summary for other counseling recommendations.  Return in about 4 weeks (around 10/09/2017).  Future Appointments  Date Time Provider Department Center  09/13/2017 11:30 AM [facility]  10/09/2017  8:15 AM Rasch, Harolyn Rutherford, NP [facility]  10/12/2017  3:30 PM [facility] [place] [facility]    Venia Carbon, NP

## 2017-09-13 ENCOUNTER — Ambulatory Visit: Payer: Self-pay

## 2017-09-13 NOTE — BH Specialist Note (Deleted)
Integrated Behavioral Health Follow Up Visit  MRN: [id number] Name: Tamara Bryant  Number of Integrated Behavioral Health Clinician visits: 2/6 3 Total Session Start time: ***  Session End time: *** Total time: {IBH Total Time:21014050}  Type of Service: Integrated Behavioral Health- Individual/Family Interpretor:No. Interpretor Name and Language: n/a  SUBJECTIVE: Tamara Bryant is a 27 y.o. female accompanied by {Patient accompanied by:[id number]} Patient was referred by *** for ***. Patient reports the following symptoms/concerns: *** Duration of problem: ***; Severity of problem: {Mild/Moderate/Severe:20260}  OBJECTIVE: Mood: {BHH MOOD:22306} and Affect: {BHH AFFECT:22307} Risk of harm to self or others: {CHL AMB BH Suicide Current Mental Status:21022748}  LIFE CONTEXT: Family and Social: *** School/Work: *** Self-Care: *** Life Changes: ***  GOALS ADDRESSED: Patient will: 1.  Reduce symptoms of: {IBH Symptoms:21014056}  2.  Increase knowledge and/or ability of: {IBH Patient Tools:21014057}  3.  Demonstrate ability to: {IBH Goals:21014053}  INTERVENTIONS: Interventions utilized:  {IBH Interventions:21014054} Standardized Assessments completed: {IBH Screening Tools:21014051}  ASSESSMENT: Patient currently experiencing ***.   Patient may benefit from ***.  PLAN: 1. Follow up with behavioral health clinician on : *** 2. Behavioral recommendations: *** 3. Referral(s): {IBH Referrals:21014055} 4. "From scale of 1-10, how likely are you to follow plan?": ***  Valetta CloseJamie C Lyndell Allaire, LCSW

## 2017-10-09 ENCOUNTER — Other Ambulatory Visit (HOSPITAL_COMMUNITY): Payer: Self-pay

## 2017-10-09 ENCOUNTER — Ambulatory Visit (INDEPENDENT_AMBULATORY_CARE_PROVIDER_SITE_OTHER): Payer: BLUE CROSS/BLUE SHIELD | Admitting: Obstetrics and Gynecology

## 2017-10-09 VITALS — BP 112/77 | HR 94 | Wt 226.0 lb

## 2017-10-09 DIAGNOSIS — D582 Other hemoglobinopathies: Secondary | ICD-10-CM

## 2017-10-09 DIAGNOSIS — F12988 Cannabis use, unspecified with other cannabis-induced disorder: Secondary | ICD-10-CM

## 2017-10-09 DIAGNOSIS — F1290 Cannabis use, unspecified, uncomplicated: Secondary | ICD-10-CM

## 2017-10-09 DIAGNOSIS — O2612 Low weight gain in pregnancy, second trimester: Secondary | ICD-10-CM

## 2017-10-09 DIAGNOSIS — R1116 Cannabis hyperemesis syndrome: Secondary | ICD-10-CM

## 2017-10-09 DIAGNOSIS — Z3400 Encounter for supervision of normal first pregnancy, unspecified trimester: Secondary | ICD-10-CM

## 2017-10-09 HISTORY — DX: Other hemoglobinopathies: D58.2

## 2017-10-09 MED ORDER — ENSURE HIGH PROTEIN PO LIQD: 1.0000 | Freq: Every day | ORAL | 1 refills | Status: DC

## 2017-10-09 NOTE — Progress Notes (Signed)
   PRENATAL VISIT NOTE  Subjective:  Tamara Bryant is a 27 y.o. G1P0 at [redacted]w[redacted]d being seen today for ongoing prenatal care.  She is currently monitored for the following issues for this low-risk pregnancy and has Supervision of normal first pregnancy, antepartum; Obesity (BMI 30-39.9); Mild intermittent asthma; Marijuana use; History of chlamydia; Depression affecting pregnancy in second trimester, antepartum; Nausea and vomiting in pregnancy; Cannabinoid hyperemesis syndrome (HCC); Poor weight gain of pregnancy, second trimester; and High blood hemoglobin F (HCC) on their problem list.  Patient reports nausea and vomiting.  Contractions: Not present. Vag. Bleeding: None.  Movement: Present. Denies leaking of fluid. Says her vomiting has improved, however she still have decreased appetite.   The following portions of the patient's history were reviewed and updated as appropriate: allergies, current medications, past family history, past medical history, past social history, past surgical history and problem list. Problem list updated.  Objective:   Vitals:   10/09/17 0827  BP: 112/77  Pulse: 94  Weight: 226 lb (102.5 kg)    Fetal Status: Fetal Heart Rate (bpm): 148   Movement: Present     General:  Alert, oriented and cooperative. Patient is in no acute distress.  Skin: Skin is warm and dry. No rash noted.   Cardiovascular: Normal heart rate noted  Respiratory: Normal respiratory effort, no problems with respiration noted  Abdomen: Soft, gravid, appropriate for gestational age.  Pain/Pressure: Present     Pelvic: Cervical exam deferred        Extremities: Normal range of motion.  Edema: None  Mental Status: Normal mood and affect. Normal behavior. Normal judgment and thought content.   Assessment and Plan:  Pregnancy: G1P0 at [redacted]w[redacted]d  1. High blood hemoglobin F (HCC) - AMB MFM GENETICS REFERRAL  2. Poor weight gain of pregnancy, second trimester  - 13 lb weight loss. - Ensure  high protein RX   3. Supervision of normal first pregnancy, antepartum  4. Marijuana use  - Says she does not use marijuana daily, says she has decreased significantly - does not have the desire to be in a hot shower all day anymore.   5. Cannabinoid hyperemesis syndrome (HCC)  Other orders - Nutritional Supplements (ENSURE HIGH PROTEIN) LIQD; Take 1 Can by mouth daily.   Preterm labor symptoms and general obstetric precautions including but not limited to vaginal bleeding, contractions, leaking of fluid and fetal movement were reviewed in detail with the patient. Please refer to After Visit Summary for other counseling recommendations.  No follow-ups on file.  Future Appointments  Date Time Provider Department Center  10/12/2017  3:30 PM [facility] [place] 1 [facility]  10/19/2017  9:00 AM [facility] GENETIC COUNSELING RM [facility]    Venia Carbon, NP

## 2017-10-12 ENCOUNTER — Other Ambulatory Visit: Payer: Self-pay | Admitting: Advanced Practice Midwife

## 2017-10-12 ENCOUNTER — Ambulatory Visit (HOSPITAL_COMMUNITY)
Admission: RE | Admit: 2017-10-12 | Discharge: 2017-10-12 | Disposition: A | Discharge status: Home / Self Care | Payer: BLUE CROSS/BLUE SHIELD | Source: Ambulatory Visit | Attending: Advanced Practice Midwife | Admitting: Advanced Practice Midwife

## 2017-10-12 DIAGNOSIS — D582 Other hemoglobinopathies: Secondary | ICD-10-CM

## 2017-10-12 DIAGNOSIS — Z3400 Encounter for supervision of normal first pregnancy, unspecified trimester: Secondary | ICD-10-CM

## 2017-10-12 DIAGNOSIS — Z3A20 20 weeks gestation of pregnancy: Secondary | ICD-10-CM | POA: Insufficient documentation

## 2017-10-12 DIAGNOSIS — Z363 Encounter for antenatal screening for malformations: Secondary | ICD-10-CM | POA: Diagnosis not present

## 2017-10-12 DIAGNOSIS — O99212 Obesity complicating pregnancy, second trimester: Secondary | ICD-10-CM | POA: Diagnosis not present

## 2017-10-12 DIAGNOSIS — O2692 Pregnancy related conditions, unspecified, second trimester: Secondary | ICD-10-CM | POA: Diagnosis not present

## 2017-10-13 ENCOUNTER — Encounter (HOSPITAL_COMMUNITY): Payer: Self-pay | Admitting: *Deleted

## 2017-10-13 ENCOUNTER — Inpatient Hospital Stay (HOSPITAL_COMMUNITY): Payer: BLUE CROSS/BLUE SHIELD

## 2017-10-13 ENCOUNTER — Inpatient Hospital Stay (HOSPITAL_COMMUNITY)
Admission: AD | Admit: 2017-10-13 | Discharge: 2017-10-13 | Disposition: A | Discharge status: Home / Self Care | Payer: BLUE CROSS/BLUE SHIELD | Source: Ambulatory Visit | Attending: Family Medicine | Admitting: Family Medicine

## 2017-10-13 DIAGNOSIS — Y92512 Supermarket, store or market as the place of occurrence of the external cause: Secondary | ICD-10-CM | POA: Diagnosis not present

## 2017-10-13 DIAGNOSIS — O26892 Other specified pregnancy related conditions, second trimester: Secondary | ICD-10-CM | POA: Insufficient documentation

## 2017-10-13 DIAGNOSIS — M25521 Pain in right elbow: Secondary | ICD-10-CM | POA: Diagnosis not present

## 2017-10-13 DIAGNOSIS — Z87891 Personal history of nicotine dependence: Secondary | ICD-10-CM | POA: Insufficient documentation

## 2017-10-13 DIAGNOSIS — Z3A19 19 weeks gestation of pregnancy: Secondary | ICD-10-CM | POA: Diagnosis not present

## 2017-10-13 DIAGNOSIS — W19XXXA Unspecified fall, initial encounter: Secondary | ICD-10-CM

## 2017-10-13 DIAGNOSIS — W010XXA Fall on same level from slipping, tripping and stumbling without subsequent striking against object, initial encounter: Secondary | ICD-10-CM | POA: Insufficient documentation

## 2017-10-13 DIAGNOSIS — M79601 Pain in right arm: Secondary | ICD-10-CM | POA: Insufficient documentation

## 2017-10-13 LAB — URINALYSIS, ROUTINE W REFLEX MICROSCOPIC
BILIRUBIN URINE: NEGATIVE
Glucose, UA: NEGATIVE mg/dL
KETONES UR: NEGATIVE mg/dL
LEUKOCYTES UA: NEGATIVE
Nitrite: NEGATIVE
PH: 7 (ref 5.0–8.0)
PROTEIN: NEGATIVE mg/dL
Specific Gravity, Urine: 1.013 (ref 1.005–1.030)

## 2017-10-13 MED ORDER — IBUPROFEN 600 MG PO TABS
600.0000 mg | ORAL_TABLET | Freq: Once | ORAL | Status: AC
  Administered 2017-10-13: 600 mg via ORAL
  Filled 2017-10-13: qty 1

## 2017-10-13 NOTE — MAU Provider Note (Signed)
History     CSN: [id number]  Arrival date and time: 10/13/17 2023   First Provider Initiated Contact with Patient 10/13/17 2129      Chief Complaint  Patient presents with  . Fall   HPI   Ms.Tamara Bryant is a 27 y.o. female [redacted]w[redacted]d here in MAU after a fall. Says she was in [employer] about an hour ago and slipped on some juice. She landed on her bottom. She tried catching her fall with her arms. She has some right arm pain from the elbow down. She is able to fully ambulate her arm however it feels sore. Denies bleeding + fetal movement.   OB History    Gravida  1   Para      Term      Preterm      AB      Living        SAB      TAB      Ectopic      Multiple      Live Births              Past Medical History:  Diagnosis Date  . Asthma   . History of tobacco use   . Marijuana use   . Obesity   . Seasonal allergies     Past Surgical History:  Procedure Laterality Date  . WISDOM TOOTH EXTRACTION      Family History  Problem Relation Age of Onset  . Hypertension Mother   . Sickle cell anemia Mother   . Diabetes Father   . Hypertension Father   . Diabetes Maternal Grandmother   . Diabetes Paternal Grandfather     Social History   Tobacco Use  . Smoking status: Former Smoker    Packs/day: 1.00  . Smokeless tobacco: Never Used  . Tobacco comment: 07/06/2017  Substance Use Topics  . Alcohol use: Yes    Comment: last 07/05  . Drug use: Yes    Types: Marijuana    Comment: LAST SMOKED ON TUESDAY 08-21-2017    Allergies: No Known Allergies  Medications Prior to Admission  Medication Sig Dispense Refill Last Dose  . clotrimazole (LOTRIMIN) 1 % cream Apply 1 application topically 2 (two) times daily. (Patient not taking: Reported on 09/11/2017) 30 g 0 Not Taking  . cyclobenzaprine (FLEXERIL) 5 MG tablet Take 1 tablet (5 mg total) by mouth 3 (three) times daily as needed for muscle spasms. 20 tablet 0 Taking  . docusate sodium (COLACE) 100  MG capsule Take 1 capsule (100 mg total) by mouth 2 (two) times daily as needed. (Patient not taking: Reported on 09/11/2017) 30 capsule 2 Not Taking  . metoCLOPramide (REGLAN) 10 MG tablet Take 1 tablet (10 mg total) by mouth 4 (four) times daily -  before meals and at bedtime. 90 tablet 1 Taking  . Nutritional Supplements (ENSURE HIGH PROTEIN) LIQD Take 1 Can by mouth daily. 15 Can 1   . polyethylene glycol powder (GLYCOLAX/MIRALAX) powder Take 17 g by mouth daily. (Patient not taking: Reported on 09/11/2017) 255 g 0 Not Taking  . Prenatal Vit-Fe Fumarate-FA (PRENATAL VITAMIN PLUS LOW IRON) 27-1 MG TABS Take 1 tablet by mouth daily. 60 tablet 3 Taking  . promethazine (PHENERGAN) 12.5 MG tablet Take 1 tablet (12.5 mg total) by mouth every 6 (six) hours as needed for nausea or vomiting. 20 tablet 0 Taking  . ranitidine (ZANTAC) 150 MG tablet Take 1 tablet (150 mg total) by mouth  2 (two) times daily. 60 tablet 1 Taking  . sertraline (ZOLOFT) 50 MG tablet Take 1 tablet (50 mg total) by mouth daily. 90 tablet 1 Taking   Results for orders placed or performed during the hospital encounter of 10/13/17 (from the past 48 hour(s))  Urinalysis, Routine w reflex microscopic     Status: Abnormal   Collection Time: 10/13/17  8:43 PM  Result Value Ref Range   Color, Urine YELLOW YELLOW   APPearance HAZY (A) CLEAR   Specific Gravity, Urine 1.013 1.005 - 1.030   pH 7.0 5.0 - 8.0   Glucose, UA NEGATIVE NEGATIVE mg/dL   Hgb urine dipstick SMALL (A) NEGATIVE   Bilirubin Urine NEGATIVE NEGATIVE   Ketones, ur NEGATIVE NEGATIVE mg/dL   Protein, ur NEGATIVE NEGATIVE mg/dL   Nitrite NEGATIVE NEGATIVE   Leukocytes, UA NEGATIVE NEGATIVE   RBC / HPF 21-50 0 - 5 RBC/hpf   WBC, UA 0-5 0 - 5 WBC/hpf   Bacteria, UA RARE (A) NONE SEEN   Squamous Epithelial / LPF 0-5 0 - 5   Mucus PRESENT     Comment: Performed at [facility], [address], [place], [place] [address]  OB Urine Culture     Status: None    Collection Time: 10/13/17  8:43 PM  Result Value Ref Range   Specimen Description      URINE, CLEAN CATCH Performed at [facility], [address], [place], [place] [address]    Special Requests      Normal Performed at [facility], [address] [place], [place] [address]    Culture      NO GROWTH NO GROUP B STREP (S.AGALACTIAE) ISOLATED Performed at [facility], [address], [place], [place] [address]    Report Status 10/15/2017 FINAL    Dg Elbow Complete Right (3+view)  Result Date: 10/13/2017 CLINICAL DATA:  Right elbow pain at the olecranon after a fall. EXAM: RIGHT ELBOW - COMPLETE 3+ VIEW COMPARISON:  None. FINDINGS: There is no evidence of fracture, dislocation, or joint effusion. There is no evidence of arthropathy or other focal bone abnormality. Soft tissues are unremarkable. IMPRESSION: Negative. Electronically Signed   By: Burman Nieves M.D.   On: 10/13/2017 22:26   [place] Mfm Ob Detail +14 Wk  Result Date: 10/12/2017 ----------------------------------------------------------------------  OBSTETRICS REPORT                       (Signed Final 10/12/2017 05:21 pm) ---------------------------------------------------------------------- Patient Info  ID #:       [id number]                          D.O.B.:  08-23-1990 (26 yrs)  Name:       Tamara Bryant                 Visit Date: 10/12/2017 04:16 pm ---------------------------------------------------------------------- Performed By  Performed By:     Eden Lathe BS      Ref. Address:     [facility]                    [facility]                                                             [address]                                                             [place], [place]                                                             [address]  Attending:        Lin Landsman      Location:         [facility]                    MD  Referred By:      [facility] for                    [facility]                    Healthcare ---------------------------------------------------------------------- Orders   #  Description                          Code         Ordered By   1  [place] MFM OB DETAIL +14 WK              [id number]     Thressa Sheller  ----------------------------------------------------------------------   #  Order #                    Accession #                 Episode #   1  [id number]                  [id number]                  [id number]  ---------------------------------------------------------------------- Indications   Encounter for antenatal screening for          Z36.3   malformations   Obesity complicating pregnancy, second         O99.212   trimester (pregravid BMI   Medical complication of pregnancy (High        O26.90   hemoglobin F)   [redacted] weeks gestation of pregnancy  Z3A.20  ---------------------------------------------------------------------- Fetal Evaluation  Num Of Fetuses:         1  Fetal Heart Rate(bpm):  155  Cardiac Activity:       Observed  Presentation:           Breech  Placenta:               Posterior  P. Cord Insertion:      Visualized  Amniotic Fluid  AFI FV:      Within normal limits                              Largest Pocket(cm)                              6.5 ---------------------------------------------------------------------- Biometry  BPD:      44.1  mm     G. Age:  19w 2d         18  %    CI:        72.15   %    70 - 86                                                          FL/HC:      17.4   %    16.8 - 19.8  HC:      165.2  mm     G. Age:  19w 2d          9  %    HC/AC:      1.06        1.09 - 1.39  AC:      155.3  mm     G. Age:  20w 5d         62  %    FL/BPD:     65.3   %  FL:       28.8  mm     G. Age:  18w 6d          8  %    FL/AC:      18.5   %    20 - 24  HUM:        30  mm     G.  Age:  19w 6d         45  %  Est. FW:     312  gm    0 lb 11 oz      41  % ---------------------------------------------------------------------- OB History  Gravidity:    1         Term:   0        Prem:   0        SAB:   0  TOP:          0       Ectopic:  0        Living: 0 ---------------------------------------------------------------------- Gestational Age  LMP:           20w 1d        Date:  05/24/17                 EDD:   02/28/18  U/S Today:  19w 4d                                        EDD:   03/04/18  Best:          20w 1d     Det. By:  LMP  (05/24/17)          EDD:   02/28/18 ---------------------------------------------------------------------- Anatomy  Cranium:               Appears normal         LVOT:                   Not well visualized  Cavum:                 Appears normal         Aortic Arch:            Not well visualized  Ventricles:            Appears normal         Ductal Arch:            Not well visualized  Choroid Plexus:        Appears normal         Diaphragm:              Not well visualized  Cerebellum:            Appears normal         Stomach:                Appears normal, left                                                                        sided  Posterior Fossa:       Appears normal         Abdomen:                Appears normal  Nuchal Fold:           Appears normal         Abdominal Wall:         Appears nml (cord                                                                        insert, abd wall)  Face:                  Appears normal         Cord Vessels:           Appears normal (3                         (orbits and profile)  vessel cord)  Lips:                  Appears normal         Kidneys:                Bilateral UTD  Palate:                Appears normal         Bladder:                Appears normal  Thoracic:              Appears normal         Spine:                  Not well visualized  Heart:                 Not well  visualized    Upper Extremities:      Appears normal  RVOT:                  Not well visualized    Lower Extremities:      Appears normal  Other:  Heels and 5th digit visualized. Nasal bone visualized. Technically          difficult due to maternal habitus and fetal position. ---------------------------------------------------------------------- Cervix Uterus Adnexa  Cervix  Length:            3.5  cm.  Normal appearance by transabdominal scan.  Uterus  No abnormality visualized.  Left Ovary  Not visualized.  Right Ovary  Not visualized.  Cul De Sac  No free fluid seen.  Adnexa  No abnormality visualized. ---------------------------------------------------------------------- Impression  No ultrasonic evidence of structural fetal anomalies. Normal  Growth  Suboptimal views of the fetal heart and spine. ---------------------------------------------------------------------- Recommendations  Follow up growth in 4-6 anatomy. ----------------------------------------------------------------------               Lin Landsman, MD Electronically Signed Final Report   10/12/2017 05:21 pm ----------------------------------------------------------------------   Review of Systems  Gastrointestinal: Negative for abdominal pain.  Musculoskeletal: Positive for arthralgias and back pain.   Physical Exam   Blood pressure 113/75, pulse 81, temperature 98.9 F (37.2 C), resp. rate 18, height 5\' 4"  (1.626 m), weight 103.4 kg, last menstrual period 05/24/2017.  Physical Exam  Constitutional: She is oriented to person, place, and time. She appears well-developed and well-nourished. No distress.  HENT:  Head: Normocephalic.  Eyes: Pupils are equal, round, and reactive to light.  GI: Soft. She exhibits no distension. There is no tenderness. There is no rebound and no guarding.  Genitourinary:  Genitourinary Comments: Dilation: Closed Effacement (%): Thick Exam by:: J Rasch, NP  Musculoskeletal:       Right elbow:  She exhibits normal range of motion and no swelling. Tenderness found.  Neurological: She is alert and oriented to person, place, and time.  Skin: Skin is warm. She is not diaphoretic.  Psychiatric: Her behavior is normal.    MAU Course  Procedures  MDM  A positive blood type.  Elbow xray negative [place] and Urine culture sent Ibuprofen 600 mg PO given. Pain improved   Assessment and Plan   A:  1. [redacted] weeks gestation of pregnancy   2. Fall   3. Right elbow pain     P:  Discharge home with strict return precautions Return if symptoms worsen F/U with OB as scheduled  Ok to use ibuprofen sparingly as directed on the bottle Fall precautions   Rasch, Harolyn Rutherford, NP 10/15/2017 9:51 AM

## 2017-10-13 NOTE — MAU Note (Addendum)
About 2000 was in store and slipped and fell back on buttocks. Denies LOF or bleeding. Some pain on R side and R arm hurts where I caught myself. Also pain bilateral groin area and lower abd when I walk. Have headache too

## 2017-10-13 NOTE — Discharge Instructions (Signed)
Heat Therapy Heat therapy can help ease sore, stiff, injured, and tight muscles and joints. Heat relaxes your muscles, which may help ease your pain. Heat therapy should only be used on old, pre-existing, or long-lasting (chronic) injuries. Do not use heat therapy unless told by your doctor. How to use heat therapy There are several different kinds of heat therapy, including:  Moist heat pack.  Warm water bath.  Hot water bottle.  Electric heating pad.  Heated gel pack.  Heated wrap.  Electric heating pad.  General heat therapy recommendations  Do not sleep while using heat therapy. Only use heat therapy while you are awake.  Your skin may turn pink while using heat therapy. Do not use heat therapy if your skin turns red.  Do not use heat therapy if you have new pain.  High heat or long exposure to heat can cause burns. Be careful when using heat therapy to avoid burning your skin.  Do not use heat therapy on areas of your skin that are already irritated, such as with a rash or sunburn. Get help if:  You have blisters, redness, swelling (puffiness), or numbness.  You have new pain.  Your pain is worse. This information is not intended to replace advice given to you by your health care provider. Make sure you discuss any questions you have with your health care provider. Document Released: 03/13/2011 Document Revised: 05/27/2015 Document Reviewed: 02/11/2013 Elsevier Interactive Patient Education  [employer].

## 2017-10-14 ENCOUNTER — Inpatient Hospital Stay (HOSPITAL_COMMUNITY)
Admission: AD | Admit: 2017-10-14 | Discharge: 2017-10-14 | Disposition: A | Discharge status: Home / Self Care | Payer: BLUE CROSS/BLUE SHIELD | Source: Ambulatory Visit | Attending: Obstetrics and Gynecology | Admitting: Obstetrics and Gynecology

## 2017-10-14 ENCOUNTER — Encounter (HOSPITAL_COMMUNITY): Payer: Self-pay | Admitting: *Deleted

## 2017-10-14 DIAGNOSIS — Z3A19 19 weeks gestation of pregnancy: Secondary | ICD-10-CM | POA: Insufficient documentation

## 2017-10-14 DIAGNOSIS — R109 Unspecified abdominal pain: Secondary | ICD-10-CM

## 2017-10-14 DIAGNOSIS — O26899 Other specified pregnancy related conditions, unspecified trimester: Secondary | ICD-10-CM | POA: Diagnosis not present

## 2017-10-14 DIAGNOSIS — R1031 Right lower quadrant pain: Secondary | ICD-10-CM | POA: Diagnosis not present

## 2017-10-14 DIAGNOSIS — O2612 Low weight gain in pregnancy, second trimester: Secondary | ICD-10-CM

## 2017-10-14 DIAGNOSIS — O26892 Other specified pregnancy related conditions, second trimester: Secondary | ICD-10-CM | POA: Insufficient documentation

## 2017-10-14 DIAGNOSIS — Z87891 Personal history of nicotine dependence: Secondary | ICD-10-CM | POA: Diagnosis not present

## 2017-10-14 DIAGNOSIS — F12988 Cannabis use, unspecified with other cannabis-induced disorder: Secondary | ICD-10-CM

## 2017-10-14 DIAGNOSIS — O219 Vomiting of pregnancy, unspecified: Secondary | ICD-10-CM

## 2017-10-14 DIAGNOSIS — R112 Nausea with vomiting, unspecified: Secondary | ICD-10-CM

## 2017-10-14 DIAGNOSIS — Z3400 Encounter for supervision of normal first pregnancy, unspecified trimester: Secondary | ICD-10-CM

## 2017-10-14 DIAGNOSIS — O99342 Other mental disorders complicating pregnancy, second trimester: Secondary | ICD-10-CM

## 2017-10-14 DIAGNOSIS — F329 Major depressive disorder, single episode, unspecified: Secondary | ICD-10-CM

## 2017-10-14 DIAGNOSIS — R1116 Cannabis hyperemesis syndrome: Secondary | ICD-10-CM

## 2017-10-14 NOTE — MAU Provider Note (Signed)
History     CSN: [id number]  Arrival date and time: 10/14/17 1117   First Provider Initiated Contact with Patient 10/14/17 1227      Chief Complaint  Patient presents with  . Abdominal Pain  . Emesis   HPI  Ms.  Tamara Bryant is a 27 y.o. year old G1P0 female at [redacted]w[redacted]d weeks gestation who presents to MAU reporting she fell yesterday 10/13/17 hitting her back side. She was seen yesterday 10/13/17 for the fall, RT elbow ans lower RT abdominal pain. She report N/V the whole pregnancy, so not a new issue. She reports that today she is having some occasional sharp, lower LT side abdominal pain. She denies VB or LOF.  Past Medical History:  Diagnosis Date  . Asthma   . History of tobacco use   . Marijuana use   . Obesity   . Seasonal allergies     Past Surgical History:  Procedure Laterality Date  . WISDOM TOOTH EXTRACTION      Family History  Problem Relation Age of Onset  . Hypertension Mother   . Sickle cell anemia Mother   . Diabetes Father   . Hypertension Father   . Diabetes Maternal Grandmother   . Diabetes Paternal Grandfather     Social History   Tobacco Use  . Smoking status: Former Smoker    Packs/day: 1.00  . Smokeless tobacco: Never Used  . Tobacco comment: 07/06/2017  Substance Use Topics  . Alcohol use: Yes    Comment: last 07/05  . Drug use: Yes    Types: Marijuana    Comment: LAST SMOKED ON TUESDAY 08-21-2017    Allergies: No Known Allergies  Medications Prior to Admission  Medication Sig Dispense Refill Last Dose  . clotrimazole (LOTRIMIN) 1 % cream Apply 1 application topically 2 (two) times daily. (Patient not taking: Reported on 09/11/2017) 30 g 0 Not Taking  . cyclobenzaprine (FLEXERIL) 5 MG tablet Take 1 tablet (5 mg total) by mouth 3 (three) times daily as needed for muscle spasms. 20 tablet 0 Taking  . docusate sodium (COLACE) 100 MG capsule Take 1 capsule (100 mg total) by mouth 2 (two) times daily as needed. (Patient not taking:  Reported on 09/11/2017) 30 capsule 2 Not Taking  . metoCLOPramide (REGLAN) 10 MG tablet Take 1 tablet (10 mg total) by mouth 4 (four) times daily -  before meals and at bedtime. 90 tablet 1 Taking  . Nutritional Supplements (ENSURE HIGH PROTEIN) LIQD Take 1 Can by mouth daily. 15 Can 1   . polyethylene glycol powder (GLYCOLAX/MIRALAX) powder Take 17 g by mouth daily. (Patient not taking: Reported on 09/11/2017) 255 g 0 Not Taking  . Prenatal Vit-Fe Fumarate-FA (PRENATAL VITAMIN PLUS LOW IRON) 27-1 MG TABS Take 1 tablet by mouth daily. 60 tablet 3 Taking  . promethazine (PHENERGAN) 12.5 MG tablet Take 1 tablet (12.5 mg total) by mouth every 6 (six) hours as needed for nausea or vomiting. 20 tablet 0 Taking  . ranitidine (ZANTAC) 150 MG tablet Take 1 tablet (150 mg total) by mouth 2 (two) times daily. 60 tablet 1 Taking  . sertraline (ZOLOFT) 50 MG tablet Take 1 tablet (50 mg total) by mouth daily. 90 tablet 1 Taking    Review of Systems  Constitutional: Negative.   HENT: Negative.   Eyes: Negative.   Respiratory: Negative.   Cardiovascular: Negative.   Gastrointestinal: Positive for abdominal pain (lower LT side).  Endocrine: Negative.   Genitourinary: Negative.   Musculoskeletal:  Negative.   Skin: Negative.   Allergic/Immunologic: Negative.   Neurological: Negative.   Hematological: Negative.   Psychiatric/Behavioral: Negative.    Physical Exam   Blood pressure 124/78, pulse 79, temperature 98.7 F (37.1 C), temperature source Oral, resp. rate 17, last menstrual period 05/24/2017, SpO2 100 %.  Physical Exam  Nursing note and vitals reviewed. Constitutional: She is oriented to person, place, and time. She appears well-developed and well-nourished.  HENT:  Head: Normocephalic and atraumatic.  Eyes: Pupils are equal, round, and reactive to light.  Neck: Normal range of motion.  Cardiovascular: Normal rate and regular rhythm.  Respiratory: Effort normal.  GI: Soft.  Genitourinary:   Genitourinary Comments: Dilation: Closed Effacement (%): Thick Cervical Position: Posterior Exam by: Raelyn Mora, CNM   Musculoskeletal: Normal range of motion.  Neurological: She is alert and oriented to person, place, and time.  Skin: Skin is warm and dry.  Psychiatric: She has a normal mood and affect. Her behavior is normal. Judgment and thought content normal.    MAU Course  Procedures  MDM CCUA FHTs by doppler: 154 bpm   Results for orders placed or performed during the hospital encounter of 10/13/17 (from the past 24 hour(s))  Urinalysis, Routine w reflex microscopic     Status: Abnormal   Collection Time: 10/13/17  8:43 PM  Result Value Ref Range   Color, Urine YELLOW YELLOW   APPearance HAZY (A) CLEAR   Specific Gravity, Urine 1.013 1.005 - 1.030   pH 7.0 5.0 - 8.0   Glucose, UA NEGATIVE NEGATIVE mg/dL   Hgb urine dipstick SMALL (A) NEGATIVE   Bilirubin Urine NEGATIVE NEGATIVE   Ketones, ur NEGATIVE NEGATIVE mg/dL   Protein, ur NEGATIVE NEGATIVE mg/dL   Nitrite NEGATIVE NEGATIVE   Leukocytes, UA NEGATIVE NEGATIVE   RBC / HPF 21-50 0 - 5 RBC/hpf   WBC, UA 0-5 0 - 5 WBC/hpf   Bacteria, UA RARE (A) NONE SEEN   Squamous Epithelial / LPF 0-5 0 - 5   Mucus PRESENT     Assessment and Plan  Abdominal pain affecting pregnancy  - Discussed RLP and comfort measures - Advised to take Tylenol 1000 mg po every 6 hrs prn pain - Information provided on adb pain in pregnancy - Keep scheduled [place] appts  - Discharge patient - Patient verbalized an understanding of the plan of care and agrees.      Raelyn Mora, MSN, CNM 10/14/2017, 12:28 PM

## 2017-10-14 NOTE — MAU Note (Signed)
Pt was seen in MAU yesterday after a fall on her back side at the store.  She had lower abdominal pain on the right side and right sided elbow pain.  She has had nausea and vomiting with the whole pregnancy.  Today she said she is having sharp lower left sided pain, and vomiting.  Denies VB/LOF.

## 2017-10-15 LAB — CULTURE, OB URINE
Culture: NO GROWTH
SPECIAL REQUESTS: NORMAL

## 2017-10-18 ENCOUNTER — Telehealth: Payer: Self-pay | Admitting: Family Medicine

## 2017-10-18 ENCOUNTER — Encounter: Payer: Self-pay | Admitting: Obstetrics and Gynecology

## 2017-10-18 NOTE — Telephone Encounter (Signed)
Pt called wanting a school note sent to her MyChart for her appts on 10/09/17 and 9/12 with Jamie. Explained to the patient I could not do the letter for 9/12 because she did not come to that appt but I could do one for the 10/09/17 appt. Pt wanted to speak to Asher Muir in regards to the 9/12 appt but she was not available. Pt acknowledge understanding.

## 2017-10-19 ENCOUNTER — Ambulatory Visit (HOSPITAL_COMMUNITY): Admission: RE | Admit: 2017-10-19 | Payer: BLUE CROSS/BLUE SHIELD | Source: Ambulatory Visit

## 2017-10-29 ENCOUNTER — Other Ambulatory Visit: Payer: Self-pay

## 2017-10-29 ENCOUNTER — Encounter (HOSPITAL_COMMUNITY): Payer: Self-pay | Admitting: *Deleted

## 2017-10-29 ENCOUNTER — Inpatient Hospital Stay (HOSPITAL_COMMUNITY)
Admission: AD | Admit: 2017-10-29 | Discharge: 2017-10-29 | Disposition: A | Discharge status: Home / Self Care | Payer: BLUE CROSS/BLUE SHIELD | Source: Ambulatory Visit | Attending: Obstetrics & Gynecology | Admitting: Obstetrics & Gynecology

## 2017-10-29 DIAGNOSIS — E669 Obesity, unspecified: Secondary | ICD-10-CM | POA: Diagnosis not present

## 2017-10-29 DIAGNOSIS — Z87891 Personal history of nicotine dependence: Secondary | ICD-10-CM | POA: Diagnosis not present

## 2017-10-29 DIAGNOSIS — O26892 Other specified pregnancy related conditions, second trimester: Secondary | ICD-10-CM

## 2017-10-29 DIAGNOSIS — A599 Trichomoniasis, unspecified: Secondary | ICD-10-CM | POA: Diagnosis not present

## 2017-10-29 DIAGNOSIS — Z79899 Other long term (current) drug therapy: Secondary | ICD-10-CM | POA: Diagnosis not present

## 2017-10-29 DIAGNOSIS — Z6838 Body mass index (BMI) 38.0-38.9, adult: Secondary | ICD-10-CM | POA: Insufficient documentation

## 2017-10-29 DIAGNOSIS — H65199 Other acute nonsuppurative otitis media, unspecified ear: Secondary | ICD-10-CM | POA: Diagnosis not present

## 2017-10-29 DIAGNOSIS — H9201 Otalgia, right ear: Secondary | ICD-10-CM | POA: Diagnosis present

## 2017-10-29 DIAGNOSIS — H6690 Otitis media, unspecified, unspecified ear: Secondary | ICD-10-CM | POA: Insufficient documentation

## 2017-10-29 DIAGNOSIS — Z7251 High risk heterosexual behavior: Secondary | ICD-10-CM | POA: Insufficient documentation

## 2017-10-29 DIAGNOSIS — O98312 Other infections with a predominantly sexual mode of transmission complicating pregnancy, second trimester: Secondary | ICD-10-CM | POA: Diagnosis not present

## 2017-10-29 LAB — URINALYSIS, ROUTINE W REFLEX MICROSCOPIC
BILIRUBIN URINE: NEGATIVE
GLUCOSE, UA: NEGATIVE mg/dL
HGB URINE DIPSTICK: NEGATIVE
KETONES UR: NEGATIVE mg/dL
NITRITE: NEGATIVE
PH: 6 (ref 5.0–8.0)
PROTEIN: NEGATIVE mg/dL
Specific Gravity, Urine: 1.015 (ref 1.005–1.030)

## 2017-10-29 LAB — WET PREP, GENITAL
Sperm: NONE SEEN
Yeast Wet Prep HPF POC: NONE SEEN

## 2017-10-29 MED ORDER — METRONIDAZOLE 500 MG PO TABS
2000.0000 mg | ORAL_TABLET | Freq: Once | ORAL | Status: AC
  Administered 2017-10-29: 2000 mg via ORAL
  Filled 2017-10-29: qty 4

## 2017-10-29 MED ORDER — AMOXICILLIN-POT CLAVULANATE 875-125 MG PO TABS: 1.0000 | ORAL_TABLET | Freq: Two times a day (BID) | ORAL | 0 refills | Status: DC

## 2017-10-29 NOTE — MAU Note (Signed)
Having some vaginal itching and burning, 2-3 days.   Thinks she might have an ear infection, pain in rt ear/jaw and down the neck.  Denies fever.

## 2017-10-29 NOTE — Discharge Instructions (Signed)
Otitis Media, Adult Otitis media is redness, soreness, and puffiness (swelling) in the space just behind your eardrum (middle ear). It may be caused by allergies or infection. It often happens along with a cold. Follow these instructions at home:  Take your medicine as told. Finish it even if you start to feel better.  Only take over-the-counter or prescription medicines for pain, discomfort, or fever as told by your doctor.  Follow up with your doctor as told. Contact a doctor if:  You have otitis media only in one ear, or bleeding from your nose, or both.  You notice a lump on your neck.  You are not getting better in 3-5 days.  You feel worse instead of better. Get help right away if:  You have pain that is not helped with medicine.  You have puffiness, redness, or pain around your ear.  You get a stiff neck.  You cannot move part of your face (paralysis).  You notice that the bone behind your ear hurts when you touch it. This information is not intended to replace advice given to you by your health care provider. Make sure you discuss any questions you have with your health care provider. Document Released: 06/07/2007 Document Revised: 05/27/2015 Document Reviewed: 07/16/2012 Elsevier Interactive Patient Education  2017 Elsevier Inc.  

## 2017-10-29 NOTE — MAU Provider Note (Signed)
Chief Complaint: Vaginal Itching and Otalgia   First Provider Initiated Contact with Patient 10/29/17 1145     SUBJECTIVE HPI: Tamara Bryant is a 27 y.o. G1P0  who presents to Maternity Admissions reporting vaginal itching and otalgia for 2-3 days. She has no associated vaginal bleeding or dysuria. Her vaginal discharge in thin and clear. She is sexually active with multiple partners. She has a history of trichomonas 3 months ago. She describes her right ear pain as "feeling like she has water in her ear" with pressure. Her pain goes down her jaw. She has a sore throat as well. She denies ringing or hearing loss, and rhinorrhea. She does not have a fever. She has a history of ear infections as a child. She has not taken or used anything for her ear pain or vaginal itching.   Past Medical History:  Diagnosis Date  . Asthma   . History of tobacco use   . Marijuana use   . Obesity   . Seasonal allergies    OB History  Gravida Para Term Preterm AB Living  1            SAB TAB Ectopic Multiple Live Births               # Outcome Date GA Lbr Len/2nd Weight Sex Delivery Anes PTL Lv  1 Current            Past Surgical History:  Procedure Laterality Date  . WISDOM TOOTH EXTRACTION     Social History   Socioeconomic History  . Marital status: Single    Spouse name: Not on file  . Number of children: Not on file  . Years of education: Not on file  . Highest education level: Not on file  Occupational History  . Not on file  Social Needs  . Financial resource strain: Not on file  . Food insecurity:    Worry: Not on file    Inability: Not on file  . Transportation needs:    Medical: Not on file    Non-medical: Not on file  Tobacco Use  . Smoking status: Former Smoker    Packs/day: 1.00  . Smokeless tobacco: Never Used  . Tobacco comment: 07/06/2017  Substance and Sexual Activity  . Alcohol use: Not Currently    Comment: last 07/05  . Drug use: Not Currently    Types:  Marijuana    Comment: LAST SMOKED ON TUESDAY 08-21-2017  . Sexual activity: Yes    Birth control/protection: None  Lifestyle  . Physical activity:    Days per week: Not on file    Minutes per session: Not on file  . Stress: Not on file  Relationships  . Social connections:    Talks on phone: Not on file    Gets together: Not on file    Attends religious service: Not on file    Active member of club or organization: Not on file    Attends meetings of clubs or organizations: Not on file    Relationship status: Not on file  . Intimate partner violence:    Fear of current or ex partner: Not on file    Emotionally abused: Not on file    Physically abused: Not on file    Forced sexual activity: Not on file  Other Topics Concern  . Not on file  Social History Narrative   Currently in school for business, entrepreneurship    No current facility-administered medications on  file prior to encounter.    Current Outpatient Medications on File Prior to Encounter  Medication Sig Dispense Refill  . clotrimazole (LOTRIMIN) 1 % cream Apply 1 application topically 2 (two) times daily. (Patient not taking: Reported on 09/11/2017) 30 g 0  . cyclobenzaprine (FLEXERIL) 5 MG tablet Take 1 tablet (5 mg total) by mouth 3 (three) times daily as needed for muscle spasms. 20 tablet 0  . docusate sodium (COLACE) 100 MG capsule Take 1 capsule (100 mg total) by mouth 2 (two) times daily as needed. (Patient not taking: Reported on 09/11/2017) 30 capsule 2  . metoCLOPramide (REGLAN) 10 MG tablet Take 1 tablet (10 mg total) by mouth 4 (four) times daily -  before meals and at bedtime. 90 tablet 1  . Nutritional Supplements (ENSURE HIGH PROTEIN) LIQD Take 1 Can by mouth daily. 15 Can 1  . polyethylene glycol powder (GLYCOLAX/MIRALAX) powder Take 17 g by mouth daily. (Patient not taking: Reported on 09/11/2017) 255 g 0  . Prenatal Vit-Fe Fumarate-FA (PRENATAL VITAMIN PLUS LOW IRON) 27-1 MG TABS Take 1 tablet by mouth  daily. 60 tablet 3  . promethazine (PHENERGAN) 12.5 MG tablet Take 1 tablet (12.5 mg total) by mouth every 6 (six) hours as needed for nausea or vomiting. 20 tablet 0  . ranitidine (ZANTAC) 150 MG tablet Take 1 tablet (150 mg total) by mouth 2 (two) times daily. 60 tablet 1  . sertraline (ZOLOFT) 50 MG tablet Take 1 tablet (50 mg total) by mouth daily. 90 tablet 1   No Known Allergies  I have reviewed the past Medical Hx, Surgical Hx, Social Hx, Allergies and Medications.   ROS  OBJECTIVE Patient Vitals for the past 24 hrs:  BP Temp Temp src Pulse Resp SpO2 Weight  10/29/17 1123 128/85 98.4 F (36.9 C) Oral 84 18 99 % 102.5 kg   Constitutional: Well-developed, well-nourished female in no acute distress.  HEENT: TTP along right side of jaw and posterior to ear, bulging TM, injected ear canal; No erythema or swelling in throat, conjunctiva clear and white, no lymphadenopathy Cardiovascular: normal rate Respiratory: normal rate and effort.  GI: Abd soft, non-tender, gravid appropriate for gestational age. MS: Extremities nontender, no edema, normal ROM Neurologic: Alert and oriented x 4.  GU: Neg CVAT.   LAB RESULTS Results for orders placed or performed during the hospital encounter of 10/29/17 (from the past 24 hour(s))  Urinalysis, Routine w reflex microscopic     Status: Abnormal   Collection Time: 10/29/17 11:39 AM  Result Value Ref Range   Color, Urine YELLOW YELLOW   APPearance CLOUDY (A) CLEAR   Specific Gravity, Urine 1.015 1.005 - 1.030   pH 6.0 5.0 - 8.0   Glucose, UA NEGATIVE NEGATIVE mg/dL   Hgb urine dipstick NEGATIVE NEGATIVE   Bilirubin Urine NEGATIVE NEGATIVE   Ketones, ur NEGATIVE NEGATIVE mg/dL   Protein, ur NEGATIVE NEGATIVE mg/dL   Nitrite NEGATIVE NEGATIVE   Leukocytes, UA MODERATE (A) NEGATIVE   RBC / HPF 6-10 0 - 5 RBC/hpf   WBC, UA 0-5 0 - 5 WBC/hpf   Bacteria, UA RARE (A) NONE SEEN   Squamous Epithelial / LPF 6-10 0 - 5   Mucus PRESENT     Amorphous Crystal PRESENT   Wet prep, genital     Status: Abnormal   Collection Time: 10/29/17 12:34 PM  Result Value Ref Range   Yeast Wet Prep HPF POC NONE SEEN NONE SEEN   Trich, Wet Prep PRESENT (A)  NONE SEEN   Clue Cells Wet Prep HPF POC PRESENT (A) NONE SEEN   WBC, Wet Prep HPF POC FEW (A) NONE SEEN   Sperm NONE SEEN     MAU COURSE  MDM  ASSESSMENT 1. Acute otitis media with effusion   2. Trichomonas infection   3. High risk sexual behavior, unspecified type     PLAN Discharge home in stable condition. Follow up with PCP.  1. Acute otitis media with effusion--  Patient given Augmentin 875 BID for 7 days. Continue drinking lots of fluids. 2. Trichomonas-- Patient given Flagyl 2 grams in MAU. Counseled on importance of abstaining from intercourse for 7 days after treatment to prevent spread of infection. 3. High risk sexual behavior-- Patient told to tell partners to get treated as well.  Follow-up Information    [facility] Follow up.   Why:  as needed, if symptoms worsen  Contact information: [address] [id number] mc [place] [address] [phone]         Allergies as of 10/29/2017   No Known Allergies     Medication List    TAKE these medications   amoxicillin-clavulanate 875-125 MG tablet Commonly known as:  AUGMENTIN Take 1 tablet by mouth every 12 (twelve) hours.   clotrimazole 1 % cream Commonly known as:  LOTRIMIN Apply 1 application topically 2 (two) times daily.   cyclobenzaprine 5 MG tablet Commonly known as:  FLEXERIL Take 1 tablet (5 mg total) by mouth 3 (three) times daily as needed for muscle spasms.   docusate sodium 100 MG capsule Commonly known as:  COLACE Take 1 capsule (100 mg total) by mouth 2 (two) times daily as needed.   ENSURE HIGH PROTEIN Liqd Take 1 Can by mouth daily.   metoCLOPramide 10 MG tablet Commonly known as:  REGLAN Take 1 tablet (10 mg total) by mouth 4 (four)  times daily -  before meals and at bedtime.   polyethylene glycol powder powder Commonly known as:  GLYCOLAX/MIRALAX Take 17 g by mouth daily.   PRENATAL VITAMIN PLUS LOW IRON 27-1 MG Tabs Take 1 tablet by mouth daily.   promethazine 12.5 MG tablet Commonly known as:  PHENERGAN Take 1 tablet (12.5 mg total) by mouth every 6 (six) hours as needed for nausea or vomiting.   ranitidine 150 MG tablet Commonly known as:  ZANTAC Take 1 tablet (150 mg total) by mouth 2 (two) times daily.   sertraline 50 MG tablet Commonly known as:  ZOLOFT Take 1 tablet (50 mg total) by mouth daily.       Roma Kayser, Student-PA 10/29/2017  2:04 PM

## 2017-10-29 NOTE — MAU Provider Note (Signed)
History     CSN: [id number]  Arrival date and time: 10/29/17 1107   First Provider Initiated Contact with Patient 10/29/17 1145      Chief Complaint  Patient presents with  . Vaginal Itching  . Otalgia   HPI   Ms.Tamara Bryant is a 27 y.o. female G1P0 @ [redacted]w[redacted]d here in MAU with complaints of right ear pain and vaginitis. Symptoms have been present for 2-3 days. She denies vaginal bleeding or dysuria. Her discharge is non-odorous and thin. She has multiple partners at this time, she does not use condoms always. She was told she had trichomonas 3 months ago and says she took most of the 7 day treatment. She is unsure if her partner was treated. The pain in her right ear is described as pressure, with pain that radiates down her right jaw. She does have a history of ear infections. She has not taken any medication for her ear pain.    OB History    Gravida  1   Para      Term      Preterm      AB      Living        SAB      TAB      Ectopic      Multiple      Live Births              Past Medical History:  Diagnosis Date  . Asthma   . History of tobacco use   . Marijuana use   . Obesity   . Seasonal allergies     Past Surgical History:  Procedure Laterality Date  . WISDOM TOOTH EXTRACTION      Family History  Problem Relation Age of Onset  . Hypertension Mother   . Sickle cell anemia Mother   . Diabetes Father   . Hypertension Father   . Diabetes Maternal Grandmother   . Diabetes Paternal Grandfather     Social History   Tobacco Use  . Smoking status: Former Smoker    Packs/day: 1.00  . Smokeless tobacco: Never Used  . Tobacco comment: 07/06/2017  Substance Use Topics  . Alcohol use: Not Currently    Comment: last 07/05  . Drug use: Not Currently    Types: Marijuana    Comment: LAST SMOKED ON TUESDAY 08-21-2017    Allergies: No Known Allergies  Medications Prior to Admission  Medication Sig Dispense Refill Last Dose  .  clotrimazole (LOTRIMIN) 1 % cream Apply 1 application topically 2 (two) times daily. (Patient not taking: Reported on 09/11/2017) 30 g 0 Not Taking  . cyclobenzaprine (FLEXERIL) 5 MG tablet Take 1 tablet (5 mg total) by mouth 3 (three) times daily as needed for muscle spasms. 20 tablet 0 Taking  . docusate sodium (COLACE) 100 MG capsule Take 1 capsule (100 mg total) by mouth 2 (two) times daily as needed. (Patient not taking: Reported on 09/11/2017) 30 capsule 2 Not Taking  . metoCLOPramide (REGLAN) 10 MG tablet Take 1 tablet (10 mg total) by mouth 4 (four) times daily -  before meals and at bedtime. 90 tablet 1 Taking  . Nutritional Supplements (ENSURE HIGH PROTEIN) LIQD Take 1 Can by mouth daily. 15 Can 1   . polyethylene glycol powder (GLYCOLAX/MIRALAX) powder Take 17 g by mouth daily. (Patient not taking: Reported on 09/11/2017) 255 g 0 Not Taking  . Prenatal Vit-Fe Fumarate-FA (PRENATAL VITAMIN PLUS LOW IRON) 27-1 MG TABS  Take 1 tablet by mouth daily. 60 tablet 3 Taking  . promethazine (PHENERGAN) 12.5 MG tablet Take 1 tablet (12.5 mg total) by mouth every 6 (six) hours as needed for nausea or vomiting. 20 tablet 0 Taking  . ranitidine (ZANTAC) 150 MG tablet Take 1 tablet (150 mg total) by mouth 2 (two) times daily. 60 tablet 1 Taking  . sertraline (ZOLOFT) 50 MG tablet Take 1 tablet (50 mg total) by mouth daily. 90 tablet 1 Taking   Results for orders placed or performed during the hospital encounter of 10/29/17 (from the past 48 hour(s))  Urinalysis, Routine w reflex microscopic     Status: Abnormal   Collection Time: 10/29/17 11:39 AM  Result Value Ref Range   Color, Urine YELLOW YELLOW   APPearance CLOUDY (A) CLEAR   Specific Gravity, Urine 1.015 1.005 - 1.030   pH 6.0 5.0 - 8.0   Glucose, UA NEGATIVE NEGATIVE mg/dL   Hgb urine dipstick NEGATIVE NEGATIVE   Bilirubin Urine NEGATIVE NEGATIVE   Ketones, ur NEGATIVE NEGATIVE mg/dL   Protein, ur NEGATIVE NEGATIVE mg/dL   Nitrite NEGATIVE  NEGATIVE   Leukocytes, UA MODERATE (A) NEGATIVE   RBC / HPF 6-10 0 - 5 RBC/hpf   WBC, UA 0-5 0 - 5 WBC/hpf   Bacteria, UA RARE (A) NONE SEEN   Squamous Epithelial / LPF 6-10 0 - 5   Mucus PRESENT    Amorphous Crystal PRESENT     Comment: Performed at [facility], [address] [place], [place] [address]  Wet prep, genital     Status: Abnormal   Collection Time: 10/29/17 12:34 PM  Result Value Ref Range   Yeast Wet Prep HPF POC NONE SEEN NONE SEEN   Trich, Wet Prep PRESENT (A) NONE SEEN   Clue Cells Wet Prep HPF POC PRESENT (A) NONE SEEN   WBC, Wet Prep HPF POC FEW (A) NONE SEEN    Comment: MODERATE BACTERIA SEEN   Sperm NONE SEEN     Comment: Performed at [facility], [address] [place], [place] [address]   Review of Systems  Constitutional: Negative for fever.  HENT: Positive for ear pain and sore throat. Negative for hearing loss.   Genitourinary: Positive for vaginal discharge. Negative for dysuria and vaginal bleeding.   Physical Exam   Blood pressure 128/85, pulse 84, temperature 98.4 F (36.9 C), temperature source Oral, resp. rate 18, weight 102.5 kg, last menstrual period 05/24/2017, SpO2 99 %.  Physical Exam  Constitutional: She is oriented to person, place, and time. She appears well-developed and well-nourished. No distress.  HENT:  Head: Normocephalic.  Right Ear: There is tenderness. Tympanic membrane is erythematous and bulging. Tympanic membrane is not perforated. A middle ear effusion is present.  Left Ear: A middle ear effusion is present.  Eyes: Pupils are equal, round, and reactive to light.  Genitourinary:  Genitourinary Comments: Wet prep and GC collected without speculum by NP student.   Neurological: She is alert and oriented to person, place, and time.  Skin: Skin is warm. She is not diaphoretic.  Psychiatric: Her behavior is normal.   MAU Course  Procedures  None  MDM  Flagyl given in MAU 2 grams UA with Urine culture sent    Assessment and Plan   A:  1. Acute otitis media with effusion   2. Trichomonas infection   3. High risk sexual behavior, unspecified type     P:  Discharge home in stable condition RX: Augmentin Return  to MAU if symptoms worsen Condoms always Partner needs treatment; then no intercourse x 7 days  Krystian Younglove, Harolyn Rutherford, NP 10/29/2017 4:41 PM

## 2017-10-30 LAB — GC/CHLAMYDIA PROBE AMP (~~LOC~~) NOT AT ARMC
CHLAMYDIA, DNA PROBE: NEGATIVE
NEISSERIA GONORRHEA: POSITIVE — AB

## 2017-10-31 ENCOUNTER — Encounter: Payer: Self-pay | Admitting: Family Medicine

## 2017-10-31 ENCOUNTER — Ambulatory Visit (INDEPENDENT_AMBULATORY_CARE_PROVIDER_SITE_OTHER): Payer: BLUE CROSS/BLUE SHIELD | Admitting: Family Medicine

## 2017-10-31 VITALS — BP 116/78 | HR 84 | Wt 223.0 lb

## 2017-10-31 DIAGNOSIS — Z3402 Encounter for supervision of normal first pregnancy, second trimester: Secondary | ICD-10-CM

## 2017-10-31 DIAGNOSIS — O98212 Gonorrhea complicating pregnancy, second trimester: Secondary | ICD-10-CM | POA: Diagnosis not present

## 2017-10-31 DIAGNOSIS — O219 Vomiting of pregnancy, unspecified: Secondary | ICD-10-CM | POA: Diagnosis not present

## 2017-10-31 DIAGNOSIS — F1290 Cannabis use, unspecified, uncomplicated: Secondary | ICD-10-CM

## 2017-10-31 DIAGNOSIS — A599 Trichomoniasis, unspecified: Secondary | ICD-10-CM | POA: Insufficient documentation

## 2017-10-31 DIAGNOSIS — Z9189 Other specified personal risk factors, not elsewhere classified: Secondary | ICD-10-CM

## 2017-10-31 DIAGNOSIS — F12988 Cannabis use, unspecified with other cannabis-induced disorder: Secondary | ICD-10-CM

## 2017-10-31 DIAGNOSIS — Z3400 Encounter for supervision of normal first pregnancy, unspecified trimester: Secondary | ICD-10-CM

## 2017-10-31 HISTORY — DX: Trichomoniasis, unspecified: A59.9

## 2017-10-31 LAB — CULTURE, OB URINE
Culture: <10000 — AB
Special Requests: NORMAL

## 2017-10-31 MED ORDER — OMEPRAZOLE 20 MG PO CPDR: 20.0000 mg | DELAYED_RELEASE_CAPSULE | Freq: Every day | ORAL | 6 refills | Status: DC

## 2017-10-31 MED ORDER — CEFTRIAXONE SODIUM 250 MG IJ SOLR
250.0000 mg | Freq: Once | INTRAMUSCULAR | Status: AC
  Administered 2017-10-31: 250 mg via INTRAMUSCULAR

## 2017-10-31 MED ORDER — ONDANSETRON HCL 4 MG PO TABS: 4.0000 mg | ORAL_TABLET | Freq: Once | ORAL | Status: DC

## 2017-10-31 MED ORDER — ONDANSETRON 4 MG PO TBDP
4.0000 mg | ORAL_TABLET | Freq: Once | ORAL | Status: AC
  Administered 2017-10-31: 4 mg via ORAL

## 2017-10-31 MED ORDER — METRONIDAZOLE 500 MG PO TABS: 2000.0000 mg | ORAL_TABLET | Freq: Once | ORAL | 0 refills | Status: AC

## 2017-10-31 MED ORDER — ONDANSETRON HCL 4 MG/2ML IJ SOLN: 4.0000 mg | Freq: Once | INTRAMUSCULAR | Status: DC

## 2017-10-31 MED ORDER — AZITHROMYCIN 250 MG PO TABS
1000.0000 mg | ORAL_TABLET | Freq: Once | ORAL | Status: AC
  Administered 2017-10-31: 1000 mg via ORAL

## 2017-10-31 NOTE — Progress Notes (Signed)
PRENATAL VISIT NOTE  Subjective:  Tamara Bryant is a 27 y.o. G1P0 at [redacted]w[redacted]d being seen today for ongoing prenatal care.  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  - seen in MAU 2 days ago for vaginal discharge, multiple sexual partners, ear infection  - threw up Flagyl, also has gonorrhea and was called for taking medications  - lots of nausea and vomiting, lost about 22lbs in pregnancy so far - stopped drinking ensure because of nausea/vomiting  - has meds at home but nothing is working, doesn't take anything regularly  - usually in bed when not in class, feels like barely making it, business major, able to get work done but attendance is large part of class and not doing well with that - doesn't think mood is the main factor, wasn't a planned pregnancy - still heartburn doesn't think zantac working  - not smoking as much MJ because doesn't feel like it   Contractions: Not present. Vag. Bleeding: None.  Movement: Present. Denies leaking of fluid.   The following portions of the patient's history were reviewed and updated as appropriate: allergies, current medications, past family history, past medical history, past social history, past surgical history and problem list. Problem list updated.  Objective:   Vitals:   10/31/17 1431  BP: 116/78  Pulse: 84  Weight: 223 lb (101.2 kg)    Fetal Status: Fetal Heart Rate (bpm): 155 Fundal Height: 22 cm Movement: Present      General:  Alert, oriented and cooperative. Patient is in no acute distress.  Skin: Skin is warm and dry. No rash noted.   Cardiovascular: Normal heart rate noted  Respiratory: Normal respiratory effort, no problems with respiration noted  Abdomen: Soft, gravid, appropriate for gestational age.  Pain/Pressure: Present     Pelvic: Cervical exam deferred        Extremities: Normal range of motion.  Edema: None  Mental Status: Normal mood and affect. Normal behavior. Normal judgment and thought content.   Assessment and Plan:  Pregnancy: G1P0 at [redacted]w[redacted]d  1. Supervision of normal first pregnancy, antepartum -- prenatal record reviewed and UTD  2. Nausea and Vomiting in Pregnancy  Weight Loss  Fatigue: Counseled extensively on importance of sticking to a regimen with medications, avoiding MJ, trying to eat small meals and high protein meals even if not appetite. Discussed indications for hospitalization as patient expressed curiosity around need for this at this time. Moist mucous membranes, brisk cap refill, normal heart rate today.  -- CMP, CBC, Mg collected today - will notify lab to add-on TSH  -- Reglan TID, phenergan prn, switch zantac to omeprazole   3. Elevated Hemoglobin F: Called genetics office because missed appointment, they will call her to reschedule.    4. Gonorrhea, Trichomonas: Ceftriaxone and azithromycin given in clinic today. Vomited dose of Flagyl from MAU so additional script sent to pharmacy. Stressed importance of partner being treated as well.   Preterm labor symptoms and general obstetric precautions including but not limited to vaginal bleeding, contractions, leaking of fluid and fetal movement were reviewed in  detail with the patient.Please refer to After Visit Summary for other counseling recommendations.   Return in about 4 weeks (around 11/28/2017) for HROB.  Future Appointments  Date Time Provider Department Center  11/09/2017  1:30 PM [facility] [place] 5 [facility]  [facility]  12/03/2017  2:35 PM Allie Bossier, MD [facility]    Tamera Stands, DO

## 2017-11-01 ENCOUNTER — Telehealth: Payer: Self-pay | Admitting: *Deleted

## 2017-11-01 LAB — CBC
HEMOGLOBIN: 11.1 g/dL (ref 11.1–15.9)
Hematocrit: 31.2 % — ABNORMAL LOW (ref 34.0–46.6)
MCH: 29 pg (ref 26.6–33.0)
MCHC: 35.6 g/dL (ref 31.5–35.7)
MCV: 82 fL (ref 79–97)
Platelets: 315 10*3/uL (ref 150–450)
RBC: 3.83 x10E6/uL (ref 3.77–5.28)
RDW: 14.8 % (ref 12.3–15.4)
WBC: 10.7 10*3/uL (ref 3.4–10.8)

## 2017-11-01 LAB — COMPREHENSIVE METABOLIC PANEL
ALBUMIN: 3.6 g/dL (ref 3.5–5.5)
ALT: 10 IU/L (ref 0–32)
AST: 12 IU/L (ref 0–40)
Albumin/Globulin Ratio: 1.5 (ref 1.2–2.2)
Alkaline Phosphatase: 75 IU/L (ref 39–117)
BUN/Creatinine Ratio: 13 (ref 9–23)
BUN: 7 mg/dL (ref 6–20)
Bilirubin Total: 0.4 mg/dL (ref 0.0–1.2)
CHLORIDE: 103 mmol/L (ref 96–106)
CO2: 20 mmol/L (ref 20–29)
CREATININE: 0.52 mg/dL — AB (ref 0.57–1.00)
Calcium: 8.9 mg/dL (ref 8.7–10.2)
GFR calc Af Amer: 155 mL/min/{1.73_m2} (ref >59)
GFR calc non Af Amer: 134 mL/min/{1.73_m2} (ref >59)
GLUCOSE: 76 mg/dL (ref 65–99)
Globulin, Total: 2.4 g/dL (ref 1.5–4.5)
POTASSIUM: 3.6 mmol/L (ref 3.5–5.2)
SODIUM: 136 mmol/L (ref 134–144)
TOTAL PROTEIN: 6 g/dL (ref 6.0–8.5)

## 2017-11-01 LAB — MAGNESIUM: Magnesium: 1.8 mg/dL (ref 1.6–2.3)

## 2017-11-01 NOTE — Telephone Encounter (Signed)
Tamara Bryant called and left a message this am that she forgot to ask yesterday but do we do 4d ultrasounds.

## 2017-11-01 NOTE — Telephone Encounter (Signed)
I called Gioia and left a message I am returning her call and sorry that I missed her but I will send a MyChart message- please check there and call if questions.

## 2017-11-06 ENCOUNTER — Other Ambulatory Visit (HOSPITAL_COMMUNITY): Payer: Self-pay | Admitting: *Deleted

## 2017-11-06 DIAGNOSIS — O99212 Obesity complicating pregnancy, second trimester: Secondary | ICD-10-CM

## 2017-11-09 ENCOUNTER — Encounter (HOSPITAL_COMMUNITY): Payer: Self-pay

## 2017-11-09 ENCOUNTER — Other Ambulatory Visit (HOSPITAL_COMMUNITY): Payer: Self-pay | Admitting: *Deleted

## 2017-11-09 ENCOUNTER — Ambulatory Visit (HOSPITAL_COMMUNITY)
Admission: RE | Admit: 2017-11-09 | Discharge: 2017-11-09 | Disposition: A | Discharge status: Home / Self Care | Payer: BLUE CROSS/BLUE SHIELD | Source: Ambulatory Visit | Attending: Obstetrics & Gynecology | Admitting: Obstetrics & Gynecology

## 2017-11-09 DIAGNOSIS — F1290 Cannabis use, unspecified, uncomplicated: Secondary | ICD-10-CM

## 2017-11-09 DIAGNOSIS — Z3A24 24 weeks gestation of pregnancy: Secondary | ICD-10-CM

## 2017-11-09 DIAGNOSIS — Z362 Encounter for other antenatal screening follow-up: Secondary | ICD-10-CM | POA: Diagnosis not present

## 2017-11-09 DIAGNOSIS — O2692 Pregnancy related conditions, unspecified, second trimester: Secondary | ICD-10-CM

## 2017-11-09 DIAGNOSIS — O219 Vomiting of pregnancy, unspecified: Secondary | ICD-10-CM

## 2017-11-09 DIAGNOSIS — O99212 Obesity complicating pregnancy, second trimester: Secondary | ICD-10-CM

## 2017-11-09 DIAGNOSIS — Z3400 Encounter for supervision of normal first pregnancy, unspecified trimester: Secondary | ICD-10-CM

## 2017-11-09 DIAGNOSIS — O26899 Other specified pregnancy related conditions, unspecified trimester: Secondary | ICD-10-CM

## 2017-11-09 DIAGNOSIS — N1330 Unspecified hydronephrosis: Secondary | ICD-10-CM | POA: Insufficient documentation

## 2017-11-09 DIAGNOSIS — O2612 Low weight gain in pregnancy, second trimester: Secondary | ICD-10-CM

## 2017-11-09 DIAGNOSIS — O9989 Other specified diseases and conditions complicating pregnancy, childbirth and the puerperium: Secondary | ICD-10-CM | POA: Insufficient documentation

## 2017-11-09 DIAGNOSIS — F32A Depression, unspecified: Secondary | ICD-10-CM

## 2017-11-09 DIAGNOSIS — R109 Unspecified abdominal pain: Secondary | ICD-10-CM

## 2017-11-09 DIAGNOSIS — F329 Major depressive disorder, single episode, unspecified: Secondary | ICD-10-CM

## 2017-11-09 DIAGNOSIS — F12988 Cannabis use, unspecified with other cannabis-induced disorder: Secondary | ICD-10-CM

## 2017-11-09 DIAGNOSIS — O99342 Other mental disorders complicating pregnancy, second trimester: Secondary | ICD-10-CM

## 2017-11-09 HISTORY — DX: Anemia, unspecified: D64.9

## 2017-11-19 ENCOUNTER — Encounter: Payer: Self-pay | Admitting: Family Medicine

## 2017-12-03 ENCOUNTER — Other Ambulatory Visit (HOSPITAL_COMMUNITY)
Admission: RE | Admit: 2017-12-03 | Discharge: 2017-12-03 | Disposition: A | Discharge status: Home / Self Care | Payer: BLUE CROSS/BLUE SHIELD | Source: Ambulatory Visit | Attending: Obstetrics & Gynecology | Admitting: Obstetrics & Gynecology

## 2017-12-03 ENCOUNTER — Ambulatory Visit (INDEPENDENT_AMBULATORY_CARE_PROVIDER_SITE_OTHER): Payer: BLUE CROSS/BLUE SHIELD | Admitting: Obstetrics & Gynecology

## 2017-12-03 VITALS — BP 122/78 | HR 78 | Wt 221.4 lb

## 2017-12-03 DIAGNOSIS — O98212 Gonorrhea complicating pregnancy, second trimester: Secondary | ICD-10-CM

## 2017-12-03 DIAGNOSIS — R634 Abnormal weight loss: Secondary | ICD-10-CM

## 2017-12-03 DIAGNOSIS — Z3400 Encounter for supervision of normal first pregnancy, unspecified trimester: Secondary | ICD-10-CM

## 2017-12-03 DIAGNOSIS — Z3402 Encounter for supervision of normal first pregnancy, second trimester: Secondary | ICD-10-CM

## 2017-12-03 NOTE — Progress Notes (Signed)
   PRENATAL VISIT NOTE  Subjective:  Tamara Bryant is a 27 y.o. G1P0 at 3949w4d being seen today for ongoing prenatal care.  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  Patient reports no complaints.  Contractions: Not present. Vag. Bleeding: None.  Movement: Present. Denies leaking of fluid.   The following portions of the patient's history were reviewed and updated as appropriate: allergies, current medications, past family history, past medical history, past social history, past surgical history and problem list. Problem list updated.  Objective:   Vitals:   12/03/17 1456  BP: 122/78  Pulse: 78  Weight: 221 lb 6.4 oz (100.4 kg)    Fetal Status: Fetal Heart Rate (bpm): 154 Fundal Height: 28 cm Movement: Present     General:  Alert, oriented and cooperative. Patient is in no acute distress.  Skin: Skin is warm and dry. No rash noted.   Cardiovascular: Normal heart rate noted  Respiratory: Normal respiratory effort, no problems with respiration noted  Abdomen: Soft, gravid, appropriate for gestational age.  Pain/Pressure: Present     Pelvic: Cervical exam performed        Extremities: Normal range of motion.  Edema: None  Mental Status: Normal mood and affect. Normal behavior. Normal judgment and thought content.   Assessment and Plan:  Pregnancy: G1P0 at 1849w4d  1. Supervision of normal first pregnancy, antepartum  2. Weight loss  - TSH  3.  Gonorrhea affecting pregnancy in second trimester - Cervicovaginal ancillary only( [facility])  Preterm labor symptoms and general obstetric precautions including but not limited to vaginal bleeding, contractions, leaking of fluid and fetal movement were reviewed in detail with the patient. Please refer to After Visit Summary for other counseling recommendations.  Return in about 3 weeks (around 12/24/2017).  Future Appointments  Date Time Provider Department Center  12/14/2017  9:00 AM [facility] US 3 [facility]  12/14/2017 10:00 AM [facility] GENETIC COUNSELING RM [facility]    Allie BossierMyra C Alick Lecomte, MD

## 2017-12-03 NOTE — Patient Instructions (Signed)
AREA PEDIATRIC/FAMILY PRACTICE PHYSICIANS  [facility] [address], [address] [place], [place]  [address] Phone - [phone]   Fax - [phone]  [facility] [address] [place], [place] [address] Phone - [phone]   Fax - [phone]  [facility] AMOS [address] [place], [place]  [address] Phone - [phone]   Fax - [phone]  [facility] [address] [place], [place]  [address] Phone - [phone]   Fax - [phone]  [facility] [address]  [place], [place]  [address] Phone - [phone]   Fax - [phone]  [facility] [address], [address] [place], [place]  [address] Phone - [phone]   Fax - [phone]  [facility] [address] [place], [place]  [address] Phone - [phone]   Fax - [phone]  [facility] [address], [address] [place], [place]  [address] Phone - [phone]   Fax - [phone]  [facility] [address] [place], [place]  [address] Phone - [phone]   Fax - [phone] [facility] [address] [place], [place]  [address] Phone - [phone]   Fax - [phone]  [facility] [address] [place], [place]  [address] Phone - [phone]   Fax - [phone]  [facility] [address] [place], [place]  [address] Phone - [phone]   Fax - [phone]  [facility] [address], [address] [place], [place]  [address] Phone - [phone]   Fax - [phone]  SHILPA GOSRANI [address] [place], [place]  [address] Phone - [phone]  [facility] [address] [place], [place]  [address] Phone - [phone]   Fax - [phone]  [facility] [address] [place], [place]  [address] Phone - [phone]   Fax - [phone]  [facility] [address] [place], [place]  [address] Phone - [phone]   Fax - [phone]  [facility] [address] [place], [place]  [address] Phone - [phone]   Fax - [phone]   [facility] [address] [place], [place]  [address] Phone - [phone]   Fax - [phone]  [facility] [address] [place], [place]  [address] Phone - [phone]   Fax - [phone]  DAVID RUBIN [address] [place], [place]  [address] Phone - [phone]   Fax - [phone]  [facility] [address] [place], [place]  [address] Phone - [phone]   Fax - [phone]  [facility] [address] , [place]  [address] Phone - [phone]   Fax - [phone] [facility] [address] [place], [place]  [address] Phone - [phone]   Fax - [phone]  [facility] [address] [place], [place]  [address] Phone - [phone]   Fax - [phone]  [facility] [address] [place], [place]  [address] Phone - [phone]   Fax - [phone]  [facility] Charlene Flemming MD [address] [place]  [address] Phone [phone]  Fax [phone]   

## 2017-12-04 LAB — CERVICOVAGINAL ANCILLARY ONLY
Chlamydia: NEGATIVE
Neisseria Gonorrhea: NEGATIVE
Trichomonas: NEGATIVE

## 2017-12-04 LAB — TSH: TSH: 0.572 u[IU]/mL (ref 0.450–4.500)

## 2017-12-14 ENCOUNTER — Ambulatory Visit (HOSPITAL_COMMUNITY)
Admission: RE | Admit: 2017-12-14 | Discharge: 2017-12-14 | Disposition: A | Discharge status: Home / Self Care | Payer: BLUE CROSS/BLUE SHIELD | Source: Ambulatory Visit | Attending: Obstetrics & Gynecology | Admitting: Obstetrics & Gynecology

## 2017-12-14 ENCOUNTER — Encounter (HOSPITAL_COMMUNITY): Payer: Self-pay

## 2017-12-14 ENCOUNTER — Other Ambulatory Visit (HOSPITAL_COMMUNITY): Payer: Self-pay | Admitting: *Deleted

## 2017-12-14 ENCOUNTER — Ambulatory Visit (HOSPITAL_COMMUNITY): Payer: Self-pay | Admitting: Obstetrics and Gynecology

## 2017-12-14 DIAGNOSIS — Z3A29 29 weeks gestation of pregnancy: Secondary | ICD-10-CM | POA: Diagnosis not present

## 2017-12-14 DIAGNOSIS — Z362 Encounter for other antenatal screening follow-up: Secondary | ICD-10-CM | POA: Diagnosis not present

## 2017-12-14 DIAGNOSIS — O2693 Pregnancy related conditions, unspecified, third trimester: Secondary | ICD-10-CM

## 2017-12-14 DIAGNOSIS — O99213 Obesity complicating pregnancy, third trimester: Secondary | ICD-10-CM

## 2017-12-28 ENCOUNTER — Other Ambulatory Visit: Payer: Self-pay

## 2017-12-28 ENCOUNTER — Encounter: Payer: Self-pay | Admitting: Obstetrics and Gynecology

## 2018-01-02 NOTE — L&D Delivery Note (Signed)
   Delivery Note At 12:33 AM a viable female was delivered via Vaginal, Spontaneous (Presentation: LOA ).  APGAR: 9, 9; weight  Pending. After 1 minute, the cord was clamped and cut.The placenta separated spontaneously and delivered via CCT and maternal pushing effort.  It was inspected and appears to be intact with a 3 VC.  40 units of pitocin diluted in 1000cc LR was infused rapidly IV.       Anesthesia:  epidural Episiotomy: None Lacerations: 2nd degree;Labial Suture Repair: 2.0 vicryl Est. Blood Loss (mL): 267  Mom to postpartum.  Baby to Couplet care / Skin to Skin.  Scarlette Calico Cresenzo-Dishmon 03/01/2018, 1:16 AM

## 2018-01-08 ENCOUNTER — Other Ambulatory Visit: Payer: Self-pay | Admitting: *Deleted

## 2018-01-08 DIAGNOSIS — Z3400 Encounter for supervision of normal first pregnancy, unspecified trimester: Secondary | ICD-10-CM

## 2018-01-09 ENCOUNTER — Ambulatory Visit (INDEPENDENT_AMBULATORY_CARE_PROVIDER_SITE_OTHER): Payer: BLUE CROSS/BLUE SHIELD | Admitting: Obstetrics and Gynecology

## 2018-01-09 ENCOUNTER — Encounter: Payer: Self-pay | Admitting: Obstetrics and Gynecology

## 2018-01-09 ENCOUNTER — Other Ambulatory Visit: Payer: BLUE CROSS/BLUE SHIELD

## 2018-01-09 ENCOUNTER — Encounter (HOSPITAL_COMMUNITY): Payer: Self-pay

## 2018-01-09 VITALS — BP 109/81 | HR 93

## 2018-01-09 DIAGNOSIS — Z3A31 31 weeks gestation of pregnancy: Secondary | ICD-10-CM

## 2018-01-09 DIAGNOSIS — Z3403 Encounter for supervision of normal first pregnancy, third trimester: Secondary | ICD-10-CM

## 2018-01-09 DIAGNOSIS — O99342 Other mental disorders complicating pregnancy, second trimester: Secondary | ICD-10-CM

## 2018-01-09 DIAGNOSIS — O99343 Other mental disorders complicating pregnancy, third trimester: Secondary | ICD-10-CM

## 2018-01-09 DIAGNOSIS — Z3400 Encounter for supervision of normal first pregnancy, unspecified trimester: Secondary | ICD-10-CM

## 2018-01-09 DIAGNOSIS — D582 Other hemoglobinopathies: Secondary | ICD-10-CM

## 2018-01-09 DIAGNOSIS — F329 Major depressive disorder, single episode, unspecified: Secondary | ICD-10-CM

## 2018-01-09 NOTE — Progress Notes (Signed)
Subjective:  Tamara Bryant is a 28 y.o. G1P0 at [redacted]w[redacted]d being seen today for ongoing prenatal care.  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  Patient reports general discomforts of pregnancy.  Contractions: Not present. Vag. Bleeding: None.  Movement: Present. Denies leaking of fluid.   The following portions of the patient's history were reviewed and updated as appropriate: allergies, current medications, past family history, past medical history, past social history, past surgical history and problem list. Problem list updated.  Objective:   Vitals:   01/09/18 0911  BP: 109/81  Pulse: 93    Fetal Status: Fetal Heart Rate (bpm): 148   Movement: Present     General:  Alert, oriented and cooperative. Patient is in no acute distress.  Skin: Skin is warm and dry. No rash noted.   Cardiovascular: Normal heart rate noted  Respiratory: Normal respiratory effort, no problems with respiration noted  Abdomen: Soft, gravid, appropriate for gestational age. Pain/Pressure: Present     Pelvic:  Cervical exam deferred        Extremities: Normal range of motion.  Edema: None  Mental Status: Normal mood and affect. Normal behavior. Normal judgment and thought content.   Urinalysis:      Assessment and Plan:  Pregnancy: G1P0 at [redacted]w[redacted]d  1. Supervision of normal first pregnancy, antepartum Stable Glucola and 28 week labs today  2. High blood hemoglobin F (HCC) Has seen genetics, but not note  yet U/S good interval growth, F/U growth scan later this week  3. 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 Please refer to After Visit Summary for other counseling recommendations.  Return in about 2 weeks (around 01/23/2018) for OB visit.   Hermina Staggers, MD

## 2018-01-09 NOTE — Patient Instructions (Signed)
Third Trimester of Pregnancy The third trimester is from week 28 through week 40 (months 7 through 9). The third trimester is a time when the unborn baby (fetus) is growing rapidly. At the end of the ninth month, the fetus is about 20 inches in length and weighs 6-10 pounds. Body changes during your third trimester Your body will continue to go through many changes during pregnancy. The changes vary from woman to woman. During the third trimester:  Your weight will continue to increase. You can expect to gain 25-35 pounds (11-16 kg) by the end of the pregnancy.  You may begin to get stretch marks on your hips, abdomen, and breasts.  You may urinate more often because the fetus is moving lower into your pelvis and pressing on your bladder.  You may develop or continue to have heartburn. This is caused by increased hormones that slow down muscles in the digestive tract.  You may develop or continue to have constipation because increased hormones slow digestion and cause the muscles that push waste through your intestines to relax.  You may develop hemorrhoids. These are swollen veins (varicose veins) in the rectum that can itch or be painful.  You may develop swollen, bulging veins (varicose veins) in your legs.  You may have increased body aches in the pelvis, back, or thighs. This is due to weight gain and increased hormones that are relaxing your joints.  You may have changes in your hair. These can include thickening of your hair, rapid growth, and changes in texture. Some women also have hair loss during or after pregnancy, or hair that feels dry or thin. Your hair will most likely return to normal after your baby is born.  Your breasts will continue to grow and they will continue to become tender. A yellow fluid (colostrum) may leak from your breasts. This is the first milk you are producing for your baby.  Your belly button may stick out.  You may notice more swelling in your hands,  face, or ankles.  You may have increased tingling or numbness in your hands, arms, and legs. The skin on your belly may also feel numb.  You may feel short of breath because of your expanding uterus.  You may have more problems sleeping. This can be caused by the size of your belly, increased need to urinate, and an increase in your body's metabolism.  You may notice the fetus "dropping," or moving lower in your abdomen (lightening).  You may have increased vaginal discharge.  You may notice your joints feel loose and you may have pain around your pelvic bone. What to expect at prenatal visits You will have prenatal exams every 2 weeks until week 36. Then you will have weekly prenatal exams. During a routine prenatal visit:  You will be weighed to make sure you and the baby are growing normally.  Your blood pressure will be taken.  Your abdomen will be measured to track your baby's growth.  The fetal heartbeat will be listened to.  Any test results from the previous visit will be discussed.  You may have a cervical check near your due date to see if your cervix has softened or thinned (effaced).  You will be tested for Group B streptococcus. This happens between 35 and 37 weeks. Your health care provider may ask you:  What your birth plan is.  How you are feeling.  If you are feeling the baby move.  If you have had any abnormal   symptoms, such as leaking fluid, bleeding, severe headaches, or abdominal cramping.  If you are using any tobacco products, including cigarettes, chewing tobacco, and electronic cigarettes.  If you have any questions. Other tests or screenings that may be performed during your third trimester include:  Blood tests that check for low iron levels (anemia).  Fetal testing to check the health, activity level, and growth of the fetus. Testing is done if you have certain medical conditions or if there are problems during the pregnancy.  Nonstress test  (NST). This test checks the health of your baby to make sure there are no signs of problems, such as the baby not getting enough oxygen. During this test, a belt is placed around your belly. The baby is made to move, and its heart rate is monitored during movement. What is false labor? False labor is a condition in which you feel small, irregular tightenings of the muscles in the womb (contractions) that usually go away with rest, changing position, or drinking water. These are called Braxton Hicks contractions. Contractions may last for hours, days, or even weeks before true labor sets in. If contractions come at regular intervals, become more frequent, increase in intensity, or become painful, you should see your health care provider. What are the signs of labor?  Abdominal cramps.  Regular contractions that start at 10 minutes apart and become stronger and more frequent with time.  Contractions that start on the top of the uterus and spread down to the lower abdomen and back.  Increased pelvic pressure and dull back pain.  A watery or bloody mucus discharge that comes from the vagina.  Leaking of amniotic fluid. This is also known as your "water breaking." It could be a slow trickle or a gush. Let your health care provider know if it has a color or strange odor. If you have any of these signs, call your health care provider right away, even if it is before your due date. Follow these instructions at home: Medicines  Follow your health care provider's instructions regarding medicine use. Specific medicines may be either safe or unsafe to take during pregnancy.  Take a prenatal vitamin that contains at least 600 micrograms (mcg) of folic acid.  If you develop constipation, try taking a stool softener if your health care provider approves. Eating and drinking   Eat a balanced diet that includes fresh fruits and vegetables, whole grains, good sources of protein such as meat, eggs, or tofu,  and low-fat dairy. Your health care provider will help you determine the amount of weight gain that is right for you.  Avoid raw meat and uncooked cheese. These carry germs that can cause birth defects in the baby.  If you have low calcium intake from food, talk to your health care provider about whether you should take a daily calcium supplement.  Eat four or five small meals rather than three large meals a day.  Limit foods that are high in fat and processed sugars, such as fried and sweet foods.  To prevent constipation: ? Drink enough fluid to keep your urine clear or pale yellow. ? Eat foods that are high in fiber, such as fresh fruits and vegetables, whole grains, and beans. Activity  Exercise only as directed by your health care provider. Most women can continue their usual exercise routine during pregnancy. Try to exercise for 30 minutes at least 5 days a week. Stop exercising if you experience uterine contractions.  Avoid heavy lifting.  Do   not exercise in extreme heat or humidity, or at high altitudes.  Wear low-heel, comfortable shoes.  Practice good posture.  You may continue to have sex unless your health care provider tells you otherwise. Relieving pain and discomfort  Take frequent breaks and rest with your legs elevated if you have leg cramps or low back pain.  Take warm sitz baths to soothe any pain or discomfort caused by hemorrhoids. Use hemorrhoid cream if your health care provider approves.  Wear a good support bra to prevent discomfort from breast tenderness.  If you develop varicose veins: ? Wear support pantyhose or compression stockings as told by your healthcare provider. ? Elevate your feet for 15 minutes, 3-4 times a day. Prenatal care  Write down your questions. Take them to your prenatal visits.  Keep all your prenatal visits as told by your health care provider. This is important. Safety  Wear your seat belt at all times when driving.  Make  a list of emergency phone numbers, including numbers for family, friends, the hospital, and police and fire departments. General instructions  Avoid cat litter boxes and soil used by cats. These carry germs that can cause birth defects in the baby. If you have a cat, ask someone to clean the litter box for you.  Do not travel far distances unless it is absolutely necessary and only with the approval of your health care provider.  Do not use hot tubs, steam rooms, or saunas.  Do not drink alcohol.  Do not use any products that contain nicotine or tobacco, such as cigarettes and e-cigarettes. If you need help quitting, ask your health care provider.  Do not use any medicinal herbs or unprescribed drugs. These chemicals affect the formation and growth of the baby.  Do not douche or use tampons or scented sanitary pads.  Do not cross your legs for long periods of time.  To prepare for the arrival of your baby: ? Take prenatal classes to understand, practice, and ask questions about labor and delivery. ? Make a trial run to the hospital. ? Visit the hospital and tour the maternity area. ? Arrange for maternity or paternity leave through employers. ? Arrange for family and friends to take care of pets while you are in the hospital. ? Purchase a rear-facing car seat and make sure you know how to install it in your car. ? Pack your hospital bag. ? Prepare the baby's nursery. Make sure to remove all pillows and stuffed animals from the baby's crib to prevent suffocation.  Visit your dentist if you have not gone during your pregnancy. Use a soft toothbrush to brush your teeth and be gentle when you floss. Contact a health care provider if:  You are unsure if you are in labor or if your water has broken.  You become dizzy.  You have mild pelvic cramps, pelvic pressure, or nagging pain in your abdominal area.  You have lower back pain.  You have persistent nausea, vomiting, or  diarrhea.  You have an unusual or bad smelling vaginal discharge.  You have pain when you urinate. Get help right away if:  Your water breaks before 37 weeks.  You have regular contractions less than 5 minutes apart before 37 weeks.  You have a fever.  You are leaking fluid from your vagina.  You have spotting or bleeding from your vagina.  You have severe abdominal pain or cramping.  You have rapid weight loss or weight gain.  You have   shortness of breath with chest pain.  You notice sudden or extreme swelling of your face, hands, ankles, feet, or legs.  Your baby makes fewer than 10 movements in 2 hours.  You have severe headaches that do not go away when you take medicine.  You have vision changes. Summary  The third trimester is from week 28 through week 40, months 7 through 9. The third trimester is a time when the unborn baby (fetus) is growing rapidly.  During the third trimester, your discomfort may increase as you and your baby continue to gain weight. You may have abdominal, leg, and back pain, sleeping problems, and an increased need to urinate.  During the third trimester your breasts will keep growing and they will continue to become tender. A yellow fluid (colostrum) may leak from your breasts. This is the first milk you are producing for your baby.  False labor is a condition in which you feel small, irregular tightenings of the muscles in the womb (contractions) that eventually go away. These are called Braxton Hicks contractions. Contractions may last for hours, days, or even weeks before true labor sets in.  Signs of labor can include: abdominal cramps; regular contractions that start at 10 minutes apart and become stronger and more frequent with time; watery or bloody mucus discharge that comes from the vagina; increased pelvic pressure and dull back pain; and leaking of amniotic fluid. This information is not intended to replace advice given to you by your  health care provider. Make sure you discuss any questions you have with your health care provider. Document Released: 12/13/2000 Document Revised: 01/25/2016 Document Reviewed: 01/25/2016 Elsevier Interactive Patient Education  2019 [employer]  

## 2018-01-10 LAB — GLUCOSE TOLERANCE, 2 HOURS W/ 1HR
GLUCOSE, 2 HOUR: 123 mg/dL (ref 65–152)
Glucose, 1 hour: 125 mg/dL (ref 65–179)
Glucose, Fasting: 75 mg/dL (ref 65–91)

## 2018-01-10 LAB — CBC
HEMOGLOBIN: 10.8 g/dL — AB (ref 11.1–15.9)
Hematocrit: 32 % — ABNORMAL LOW (ref 34.0–46.6)
MCH: 28.4 pg (ref 26.6–33.0)
MCHC: 33.8 g/dL (ref 31.5–35.7)
MCV: 84 fL (ref 79–97)
Platelets: 314 10*3/uL (ref 150–450)
RBC: 3.8 x10E6/uL (ref 3.77–5.28)
RDW: 14.6 % (ref 11.7–15.4)
WBC: 13.7 10*3/uL — AB (ref 3.4–10.8)

## 2018-01-10 LAB — RPR: RPR: NONREACTIVE

## 2018-01-10 LAB — HIV ANTIBODY (ROUTINE TESTING W REFLEX): HIV SCREEN 4TH GENERATION: NONREACTIVE

## 2018-01-11 ENCOUNTER — Encounter (HOSPITAL_COMMUNITY): Payer: Self-pay

## 2018-01-11 ENCOUNTER — Ambulatory Visit (HOSPITAL_COMMUNITY)
Admission: RE | Admit: 2018-01-11 | Discharge: 2018-01-11 | Disposition: A | Discharge status: Home / Self Care | Payer: BLUE CROSS/BLUE SHIELD | Source: Ambulatory Visit | Attending: Obstetrics & Gynecology | Admitting: Obstetrics & Gynecology

## 2018-01-11 DIAGNOSIS — Z3A32 32 weeks gestation of pregnancy: Secondary | ICD-10-CM | POA: Diagnosis not present

## 2018-01-11 DIAGNOSIS — Z362 Encounter for other antenatal screening follow-up: Secondary | ICD-10-CM

## 2018-01-11 DIAGNOSIS — O99213 Obesity complicating pregnancy, third trimester: Secondary | ICD-10-CM | POA: Diagnosis not present

## 2018-01-23 ENCOUNTER — Encounter: Payer: Self-pay | Admitting: Nurse Practitioner

## 2018-01-23 ENCOUNTER — Ambulatory Visit (INDEPENDENT_AMBULATORY_CARE_PROVIDER_SITE_OTHER): Payer: BLUE CROSS/BLUE SHIELD | Admitting: Obstetrics & Gynecology

## 2018-01-23 VITALS — BP 104/63 | HR 97 | Wt 222.4 lb

## 2018-01-23 DIAGNOSIS — Z3400 Encounter for supervision of normal first pregnancy, unspecified trimester: Secondary | ICD-10-CM

## 2018-01-23 DIAGNOSIS — Z3403 Encounter for supervision of normal first pregnancy, third trimester: Secondary | ICD-10-CM

## 2018-01-23 DIAGNOSIS — E669 Obesity, unspecified: Secondary | ICD-10-CM

## 2018-01-23 NOTE — Progress Notes (Signed)
   PRENATAL VISIT NOTE  Subjective:  Tamara Bryant is a 28 y.o. G1P0 at [redacted]w[redacted]d being seen today for ongoing prenatal care.  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  Patient reports no complaints.  Contractions: Not present. Vag. Bleeding: None.  Movement: Present. Denies leaking of fluid.   The following portions of the patient's history were reviewed and updated as appropriate: allergies, current medications, past family history, past medical history, past social history, past surgical history and problem list. Problem list updated.  Objective:   Vitals:   01/23/18 1322  BP: 104/63  Pulse: 97  Weight: 222 lb 6.4 oz (100.9 kg)    Fetal Status: Fetal Heart Rate (bpm): 148   Movement: Present     General:  Alert, oriented and cooperative. Patient is in no acute distress.  Skin: Skin is warm and dry. No rash noted.   Cardiovascular: Normal heart rate noted  Respiratory: Normal respiratory effort, no problems with respiration noted  Abdomen: Soft, gravid, appropriate for gestational age.  Pain/Pressure: Present     Pelvic: Cervical exam deferred        Extremities: Normal range of motion.  Edema: None  Mental Status: Normal mood and affect. Normal behavior. Normal judgment and thought content.   Assessment and Plan:  Pregnancy: G1P0 at [redacted]w[redacted]d  1. Supervision of normal first pregnancy, antepartum   2. Obesity (BMI 30-39.9)   Preterm labor symptoms and general obstetric precautions  including but not limited to vaginal bleeding, contractions, leaking of fluid and fetal movement were reviewed in detail with the patient. Please refer to After Visit Summary for other counseling recommendations.  No follow-ups on file.  No future appointments.  Allie Bossier, MD

## 2018-02-06 ENCOUNTER — Encounter: Payer: Self-pay | Admitting: Obstetrics and Gynecology

## 2018-02-06 ENCOUNTER — Encounter: Payer: Self-pay | Admitting: Family Medicine

## 2018-02-06 ENCOUNTER — Ambulatory Visit (INDEPENDENT_AMBULATORY_CARE_PROVIDER_SITE_OTHER): Payer: BLUE CROSS/BLUE SHIELD | Admitting: Obstetrics and Gynecology

## 2018-02-06 VITALS — BP 108/52 | HR 83 | Wt 226.9 lb

## 2018-02-06 DIAGNOSIS — Z3400 Encounter for supervision of normal first pregnancy, unspecified trimester: Secondary | ICD-10-CM

## 2018-02-06 DIAGNOSIS — Z3403 Encounter for supervision of normal first pregnancy, third trimester: Secondary | ICD-10-CM

## 2018-02-06 DIAGNOSIS — E669 Obesity, unspecified: Secondary | ICD-10-CM

## 2018-02-06 NOTE — Progress Notes (Signed)
   PRENATAL VISIT NOTE  Subjective:  Tamara Bryant is a 28 y.o. G1P0 at [redacted]w[redacted]d being seen today for ongoing prenatal care.  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  Patient reports no complaints.  Contractions: Irritability. Vag. Bleeding: None.  Movement: Present. Denies leaking of fluid.   The following portions of the patient's history were reviewed and updated as appropriate: allergies, current medications, past family history, past medical history, past social history, past surgical history and problem list. Problem list updated.  Objective:   Vitals:   02/06/18 1602  BP: (!) 108/52  Pulse: 83  Weight: 226 lb 14.4 oz (102.9 kg)    Fetal Status: Fetal Heart Rate (bpm): 149 Fundal Height: 36 cm Movement: Present     General:  Alert, oriented and cooperative. Patient is in no acute distress.  Skin: Skin is warm and dry. No rash noted.   Cardiovascular: Normal heart rate noted  Respiratory: Normal respiratory effort, no problems with respiration noted  Abdomen: Soft, gravid, appropriate for gestational age.  Pain/Pressure: Present     Pelvic: Cervical exam deferred        Extremities: Normal range of motion.  Edema: None  Mental Status: Normal mood and affect. Normal behavior. Normal judgment and thought content.   Assessment and Plan:  Pregnancy: G1P0 at [redacted]w[redacted]d  1. Supervision of normal first pregnancy, antepartum Patient is doing well without complaints Cultures next  visit Patient is undecided on pediatrician or contraception  2. Obesity (BMI 30-39.9)   Preterm labor symptoms and general obstetric precautions including but not limited to vaginal bleeding, contractions, leaking of fluid and fetal movement were reviewed in detail with the patient. Please refer to After Visit Summary for other counseling recommendations.  Return in about 1 week (around 02/13/2018) for ROB.  No future appointments.  Catalina Antigua, MD

## 2018-02-12 ENCOUNTER — Inpatient Hospital Stay (HOSPITAL_COMMUNITY)
Admission: AD | Admit: 2018-02-12 | Discharge: 2018-02-12 | Disposition: A | Discharge status: Home / Self Care | Payer: BLUE CROSS/BLUE SHIELD | Attending: Obstetrics & Gynecology | Admitting: Obstetrics & Gynecology

## 2018-02-12 ENCOUNTER — Other Ambulatory Visit: Payer: Self-pay

## 2018-02-12 ENCOUNTER — Encounter (HOSPITAL_COMMUNITY): Payer: Self-pay

## 2018-02-12 DIAGNOSIS — O219 Vomiting of pregnancy, unspecified: Secondary | ICD-10-CM

## 2018-02-12 DIAGNOSIS — Z87891 Personal history of nicotine dependence: Secondary | ICD-10-CM | POA: Diagnosis not present

## 2018-02-12 DIAGNOSIS — R102 Pelvic and perineal pain: Secondary | ICD-10-CM | POA: Insufficient documentation

## 2018-02-12 DIAGNOSIS — O26893 Other specified pregnancy related conditions, third trimester: Secondary | ICD-10-CM | POA: Diagnosis present

## 2018-02-12 DIAGNOSIS — O210 Mild hyperemesis gravidarum: Secondary | ICD-10-CM | POA: Insufficient documentation

## 2018-02-12 DIAGNOSIS — R1030 Lower abdominal pain, unspecified: Secondary | ICD-10-CM | POA: Insufficient documentation

## 2018-02-12 DIAGNOSIS — Z3A36 36 weeks gestation of pregnancy: Secondary | ICD-10-CM | POA: Insufficient documentation

## 2018-02-12 LAB — URINALYSIS, ROUTINE W REFLEX MICROSCOPIC
BILIRUBIN URINE: NEGATIVE
Glucose, UA: NEGATIVE mg/dL
HGB URINE DIPSTICK: NEGATIVE
KETONES UR: NEGATIVE mg/dL
Leukocytes,Ua: NEGATIVE
Nitrite: NEGATIVE
Protein, ur: NEGATIVE mg/dL
SPECIFIC GRAVITY, URINE: 1.015 (ref 1.005–1.030)
pH: 8.5 — ABNORMAL HIGH (ref 5.0–8.0)

## 2018-02-12 MED ORDER — ACETAMINOPHEN 500 MG PO TABS
1000.0000 mg | ORAL_TABLET | Freq: Once | ORAL | Status: AC
  Administered 2018-02-12: 1000 mg via ORAL
  Filled 2018-02-12: qty 2

## 2018-02-12 NOTE — MAU Note (Deleted)
Not in the lobby 

## 2018-02-12 NOTE — MAU Provider Note (Signed)
History     CSN: [id number]  Arrival date and time: 02/12/18 1406   First Provider Initiated Contact with Patient 02/12/18 1534      Chief Complaint  Patient presents with  . Nausea  . Emesis  . Abdominal Pain   HPI  Ms.  Tamara Bryant is a 28 y.o. year old G1P0 female at 6547w5d weeks gestation who presents to MAU reporting upper and lower abdominal pain x 2-2.5 wks, the pain worsens with movement, N/V that has returned and back like it was in the beginning x 1 wk, in the last 24 hrs she has vomited a total of 5 times. She denies UC's, VB or LOF. She reports good (+) FM.  Past Medical History:  Diagnosis Date  . Anemia   . Asthma   . History of tobacco use   . Marijuana use   . Obesity   . Seasonal allergies     Past Surgical History:  Procedure Laterality Date  . WISDOM TOOTH EXTRACTION      Family History  Problem Relation Age of Onset  . Hypertension Mother   . Sickle cell anemia Mother   . Diabetes Father   . Hypertension Father   . Diabetes Maternal Grandmother   . Diabetes Paternal Grandfather     Social History   Tobacco Use  . Smoking status: Former Smoker    Packs/day: 1.00  . Smokeless tobacco: Former NeurosurgeonUser    Quit date: 07/09/2017  . Tobacco comment: 07/06/2017  Substance Use Topics  . Alcohol use: Not Currently    Comment: last 07/05  . Drug use: Yes    Types: Marijuana    Comment: occasional use-02/04/18    Allergies: No Known Allergies  No medications prior to admission.    Review of Systems  Constitutional: Negative.   HENT: Negative.   Eyes: Negative.   Respiratory: Negative.   Cardiovascular: Negative.   Gastrointestinal: Negative.   Endocrine: Negative.   Genitourinary: Positive for pelvic pain.  Musculoskeletal: Negative.   Skin: Negative.   Allergic/Immunologic: Negative.   Neurological: Negative.   Hematological: Negative.   Psychiatric/Behavioral: Negative.    Physical Exam   Blood pressure 111/65, pulse 93,  temperature 99.1 F (37.3 C), temperature source Oral, resp. rate 18, height 5\' 4"  (1.626 m), weight 101.6 kg, last menstrual period 05/24/2017.  Physical Exam  Nursing note and vitals reviewed. Constitutional: She is oriented to person, place, and time. She appears well-developed and well-nourished.  HENT:  Head: Normocephalic and atraumatic.  Eyes: Pupils are equal, round, and reactive to light.  Neck: Normal range of motion.  Cardiovascular: Normal rate.  Respiratory: Effort normal.  GI: Soft.  Genitourinary:    Genitourinary Comments: Dilation: 1 Effacement (%): 60 Cervical Position: Posterior Station: -3 Exam by: Arita Missawson, CNM    Musculoskeletal: Normal range of motion.  Neurological: She is alert and oriented to person, place, and time.  Skin: Skin is warm and dry.  Psychiatric: She has a normal mood and affect. Her behavior is normal. Judgment and thought content normal.    MAU Course  Procedures  MDM CCUA NST - FHR: 130 bpm / moderate variability / accels present / decels absent / TOCO: Occ UC's with UI noted  Results for orders placed or performed during the hospital encounter of 02/12/18 (from the past 24 hour(s))  Urinalysis, Routine w reflex microscopic     Status: Abnormal   Collection Time: 02/12/18  3:30 PM  Result Value Ref Range  Color, Urine YELLOW YELLOW   APPearance CLEAR CLEAR   Specific Gravity, Urine 1.015 1.005 - 1.030   pH 8.5 (H) 5.0 - 8.0   Glucose, UA NEGATIVE NEGATIVE mg/dL   Hgb urine dipstick NEGATIVE NEGATIVE   Bilirubin Urine NEGATIVE NEGATIVE   Ketones, ur NEGATIVE NEGATIVE mg/dL   Protein, ur NEGATIVE NEGATIVE mg/dL   Nitrite NEGATIVE NEGATIVE   Leukocytes,Ua NEGATIVE NEGATIVE    Assessment and Plan  Pelvic pain affecting pregnancy in third trimester, antepartum - Plan: Discharge patient - Reassurance given that the pelvic pain/pressure she is having is a normal variation of pregnancy - Information provided on pelvic pain in  pregnancy    Nausea and vomiting in pregnancy  - Advised to take the Phenergan she has at home for N/V - Information provided on morning sickness   - Discharge patient - Keep scheduled appt at [facility] on 02/13/2018 - Patient verbalized an understanding of the plan of care and agrees.   Raelyn Mora, MSN, CNM 02/12/2018, 3:35 PM

## 2018-02-12 NOTE — MAU Note (Signed)
Pt presents to MAU with c/o lower abdominal pain on both the right and left sides, pain worsens with movement. Pt has also had nausea/vomiting x 1 week, vomited 5 times in last 24 hours. Pt denies VB and LOF. +FM

## 2018-02-12 NOTE — Discharge Instructions (Signed)
Please take the Promethazine that you have at home for the nausea and vomiting.

## 2018-02-13 ENCOUNTER — Ambulatory Visit (INDEPENDENT_AMBULATORY_CARE_PROVIDER_SITE_OTHER): Payer: BLUE CROSS/BLUE SHIELD | Admitting: Obstetrics & Gynecology

## 2018-02-13 ENCOUNTER — Encounter: Payer: Self-pay | Admitting: Obstetrics & Gynecology

## 2018-02-13 ENCOUNTER — Other Ambulatory Visit (HOSPITAL_COMMUNITY)
Admission: RE | Admit: 2018-02-13 | Discharge: 2018-02-13 | Disposition: A | Discharge status: Home / Self Care | Payer: BLUE CROSS/BLUE SHIELD | Source: Ambulatory Visit | Attending: Obstetrics & Gynecology | Admitting: Obstetrics & Gynecology

## 2018-02-13 VITALS — BP 121/72 | HR 79 | Wt 221.4 lb

## 2018-02-13 DIAGNOSIS — Z3400 Encounter for supervision of normal first pregnancy, unspecified trimester: Secondary | ICD-10-CM

## 2018-02-13 DIAGNOSIS — E669 Obesity, unspecified: Secondary | ICD-10-CM

## 2018-02-13 DIAGNOSIS — Z3403 Encounter for supervision of normal first pregnancy, third trimester: Secondary | ICD-10-CM

## 2018-02-13 DIAGNOSIS — Z3A36 36 weeks gestation of pregnancy: Secondary | ICD-10-CM

## 2018-02-13 LAB — OB RESULTS CONSOLE GBS: GBS: POSITIVE

## 2018-02-13 LAB — OB RESULTS CONSOLE GC/CHLAMYDIA: Gonorrhea: NEGATIVE

## 2018-02-13 NOTE — Progress Notes (Signed)
   PRENATAL VISIT NOTE  Subjective:  Tamara Bryant is a 28 y.o. G1P0 at [redacted]w[redacted]d being seen today for ongoing prenatal care.  She is currently monitored for the following issues for this low-risk pregnancy and has Supervision of normal first pregnancy, antepartum; Obesity (BMI 30-39.9); Mild intermittent asthma; Marijuana use; History of chlamydia and gonorrhea ; Depression affecting pregnancy in second trimester, antepartum; Nausea and vomiting in pregnancy; Cannabinoid hyperemesis syndrome (HCC); High blood hemoglobin F (HCC); History of Trichomonas in Pregnancy; Has multiple sexual partners; and Pelvic pain affecting pregnancy in third trimester, antepartum on their problem list.  Patient reports no complaints.  Contractions: Irritability. Vag. Bleeding: None.  Movement: Present. Denies leaking of fluid.   The following portions of the patient's history were reviewed and updated as appropriate: allergies, current medications, past family history, past medical history, past social history, past surgical history and problem list. Problem list updated.  Objective:   Vitals:   02/13/18 1447  BP: 121/72  Pulse: 79  Weight: 221 lb 6.4 oz (100.4 kg)    Fetal Status: Fetal Heart Rate (bpm): 155   Movement: Present     General:  Alert, oriented and cooperative. Patient is in no acute distress.  Skin: Skin is warm and dry. No rash noted.   Cardiovascular: Normal heart rate noted  Respiratory: Normal respiratory effort, no problems with respiration noted  Abdomen: Soft, gravid, appropriate for gestational age.  Pain/Pressure: Present     Pelvic: Cervical exam performed        Extremities: Normal range of motion.  Edema: None  Mental Status: Normal mood and affect. Normal behavior. Normal judgment and thought content.   Assessment and Plan:  Pregnancy: G1P0 at [redacted]w[redacted]d  1. Obesity (BMI 30-39.9)   2. Supervision of normal first pregnancy, antepartum  - Cervicovaginal ancillary only( [facility]) - Culture, beta strep (group b only)  Preterm labor symptoms and general obstetric precautions including but not limited to vaginal bleeding, contractions, leaking of fluid and fetal movement were reviewed in detail with the patient. Please refer to After Visit Summary for other counseling recommendations.  Return in about 1 week (around 02/20/2018).  No future appointments.  Berania Peedin C Karyss Frese, MD  

## 2018-02-13 NOTE — Progress Notes (Signed)
   PRENATAL VISIT NOTE  Subjective:  Tamara Bryant is a 28 y.o. G1P0 at [redacted]w[redacted]d being seen today for ongoing prenatal care.  She is currently monitored for the following issues for this low-risk pregnancy and has Supervision of normal first pregnancy, antepartum; Obesity (BMI 30-39.9); Mild intermittent asthma; Marijuana use; History of chlamydia and gonorrhea ; Depression affecting pregnancy in second trimester, antepartum; Nausea and vomiting in pregnancy; Cannabinoid hyperemesis syndrome (HCC); High blood hemoglobin F (HCC); History of Trichomonas in Pregnancy; Has multiple sexual partners; and Pelvic pain affecting pregnancy in third trimester, antepartum on their problem list.  Patient reports no complaints.  Contractions: Irritability. Vag. Bleeding: None.  Movement: Present. Denies leaking of fluid.   The following portions of the patient's history were reviewed and updated as appropriate: allergies, current medications, past family history, past medical history, past social history, past surgical history and problem list. Problem list updated.  Objective:   Vitals:   02/13/18 1447  BP: 121/72  Pulse: 79  Weight: 221 lb 6.4 oz (100.4 kg)    Fetal Status: Fetal Heart Rate (bpm): 155   Movement: Present     General:  Alert, oriented and cooperative. Patient is in no acute distress.  Skin: Skin is warm and dry. No rash noted.   Cardiovascular: Normal heart rate noted  Respiratory: Normal respiratory effort, no problems with respiration noted  Abdomen: Soft, gravid, appropriate for gestational age.  Pain/Pressure: Present     Pelvic: Cervical exam performed        Extremities: Normal range of motion.  Edema: None  Mental Status: Normal mood and affect. Normal behavior. Normal judgment and thought content.   Assessment and Plan:  Pregnancy: G1P0 at [redacted]w[redacted]d  1. Obesity (BMI 30-39.9)   2. Supervision of normal first pregnancy, antepartum  - Cervicovaginal ancillary only( [facility]) - Culture, beta strep (group b only)  Preterm labor symptoms and general obstetric precautions including but not limited to vaginal bleeding, contractions, leaking of fluid and fetal movement were reviewed in detail with the patient. Please refer to After Visit Summary for other counseling recommendations.  Return in about 1 week (around 02/20/2018).  No future appointments.  Allie Bossier, MD

## 2018-02-14 LAB — CERVICOVAGINAL ANCILLARY ONLY
CHLAMYDIA, DNA PROBE: NEGATIVE
Neisseria Gonorrhea: NEGATIVE

## 2018-02-15 ENCOUNTER — Inpatient Hospital Stay (HOSPITAL_COMMUNITY)
Admission: AD | Admit: 2018-02-15 | Discharge: 2018-02-15 | Disposition: A | Discharge status: Home / Self Care | Payer: BLUE CROSS/BLUE SHIELD | Attending: Obstetrics & Gynecology | Admitting: Obstetrics & Gynecology

## 2018-02-15 ENCOUNTER — Encounter (HOSPITAL_COMMUNITY): Payer: Self-pay | Admitting: *Deleted

## 2018-02-15 DIAGNOSIS — O471 False labor at or after 37 completed weeks of gestation: Secondary | ICD-10-CM | POA: Insufficient documentation

## 2018-02-15 DIAGNOSIS — Z3A37 37 weeks gestation of pregnancy: Secondary | ICD-10-CM | POA: Diagnosis not present

## 2018-02-15 DIAGNOSIS — O479 False labor, unspecified: Secondary | ICD-10-CM | POA: Diagnosis present

## 2018-02-15 NOTE — MAU Note (Signed)
Pt C/O lower abdominal pain &I lower back pain, unsure if it is contractions, since this morning.  Has been vomiting this week, worse today.  Denies bleeding or LOF.  1 cm dilated yesterday.  Reports good fetal movement.

## 2018-02-15 NOTE — MAU Note (Signed)
Urine sent to lab 

## 2018-02-15 NOTE — Discharge Instructions (Signed)
Braxton Hicks Contractions Contractions of the uterus can occur throughout pregnancy, but they are not always a sign that you are in labor. You may have practice contractions called Braxton Hicks contractions. These false labor contractions are sometimes confused with true labor. What are Braxton Hicks contractions? Braxton Hicks contractions are tightening movements that occur in the muscles of the uterus before labor. Unlike true labor contractions, these contractions do not result in opening (dilation) and thinning of the cervix. Toward the end of pregnancy (32-34 weeks), Braxton Hicks contractions can happen more often and may become stronger. These contractions are sometimes difficult to tell apart from true labor because they can be very uncomfortable. You should not feel embarrassed if you go to the hospital with false labor. Sometimes, the only way to tell if you are in true labor is for your health care provider to look for changes in the cervix. The health care provider will do a physical exam and may monitor your contractions. If you are not in true labor, the exam should show that your cervix is not dilating and your water has not broken. If there are no other health problems associated with your pregnancy, it is completely safe for you to be sent home with false labor. You may continue to have Braxton Hicks contractions until you go into true labor. How to tell the difference between true labor and false labor True labor  Contractions last 30-70 seconds.  Contractions become very regular.  Discomfort is usually felt in the top of the uterus, and it spreads to the lower abdomen and low back.  Contractions do not go away with walking.  Contractions usually become more intense and increase in frequency.  The cervix dilates and gets thinner. False labor  Contractions are usually shorter and not as strong as true labor contractions.  Contractions are usually irregular.  Contractions  are often felt in the front of the lower abdomen and in the groin.  Contractions may go away when you walk around or change positions while lying down.  Contractions get weaker and are shorter-lasting as time goes on.  The cervix usually does not dilate or become thin. Follow these instructions at home:   Take over-the-counter and prescription medicines only as told by your health care provider.  Keep up with your usual exercises and follow other instructions from your health care provider.  Eat and drink lightly if you think you are going into labor.  If Braxton Hicks contractions are making you uncomfortable: ? Change your position from lying down or resting to walking, or change from walking to resting. ? Sit and rest in a tub of warm water. ? Drink enough fluid to keep your urine pale yellow. Dehydration may cause these contractions. ? Do slow and deep breathing several times an hour.  Keep all follow-up prenatal visits as told by your health care provider. This is important. Contact a health care provider if:  You have a fever.  You have continuous pain in your abdomen. Get help right away if:  Your contractions become stronger, more regular, and closer together.  You have fluid leaking or gushing from your vagina.  You pass blood-tinged mucus (bloody show).  You have bleeding from your vagina.  You have low back pain that you never had before.  You feel your baby's head pushing down and causing pelvic pressure.  Your baby is not moving inside you as much as it used to. Summary  Contractions that occur before labor are   called Braxton Hicks contractions, false labor, or practice contractions.  Braxton Hicks contractions are usually shorter, weaker, farther apart, and less regular than true labor contractions. True labor contractions usually become progressively stronger and regular, and they become more frequent.  Manage discomfort from Braxton Hicks contractions  by changing position, resting in a warm bath, drinking plenty of water, or practicing deep breathing. This information is not intended to replace advice given to you by your health care provider. Make sure you discuss any questions you have with your health care provider. Document Released: 05/04/2016 Document Revised: 10/03/2016 Document Reviewed: 05/04/2016 Elsevier Interactive Patient Education  2019 [employer]  

## 2018-02-16 LAB — CULTURE, BETA STREP (GROUP B ONLY): STREP GP B CULTURE: POSITIVE — AB

## 2018-02-18 ENCOUNTER — Encounter: Payer: Self-pay | Admitting: Obstetrics & Gynecology

## 2018-02-18 DIAGNOSIS — O99820 Streptococcus B carrier state complicating pregnancy: Secondary | ICD-10-CM | POA: Insufficient documentation

## 2018-02-20 ENCOUNTER — Encounter: Payer: Self-pay | Admitting: Obstetrics & Gynecology

## 2018-02-20 ENCOUNTER — Ambulatory Visit (INDEPENDENT_AMBULATORY_CARE_PROVIDER_SITE_OTHER): Payer: BLUE CROSS/BLUE SHIELD | Admitting: Obstetrics & Gynecology

## 2018-02-20 VITALS — BP 123/76 | HR 108

## 2018-02-20 DIAGNOSIS — Z3A37 37 weeks gestation of pregnancy: Secondary | ICD-10-CM

## 2018-02-20 DIAGNOSIS — O99820 Streptococcus B carrier state complicating pregnancy: Secondary | ICD-10-CM

## 2018-02-20 DIAGNOSIS — Z3403 Encounter for supervision of normal first pregnancy, third trimester: Secondary | ICD-10-CM

## 2018-02-20 DIAGNOSIS — E669 Obesity, unspecified: Secondary | ICD-10-CM

## 2018-02-20 DIAGNOSIS — Z3400 Encounter for supervision of normal first pregnancy, unspecified trimester: Secondary | ICD-10-CM

## 2018-02-20 NOTE — Progress Notes (Signed)
   PRENATAL VISIT NOTE  Subjective:  Tamara Bryant is a 28 y.o. G1P0 at [redacted]w[redacted]d being seen today for ongoing prenatal care.  She is currently monitored for the following issues for this low-risk pregnancy and has Supervision of normal first pregnancy, antepartum; Obesity (BMI 30-39.9); Mild intermittent asthma; Marijuana use; History of chlamydia and gonorrhea ; Depression affecting pregnancy in second trimester, antepartum; Nausea and vomiting in pregnancy; Cannabinoid hyperemesis syndrome (HCC); High blood hemoglobin F (HCC); History of Trichomonas in Pregnancy; Has multiple sexual partners; Pelvic pain affecting pregnancy in third trimester, antepartum; False labor; and GBS (group B Streptococcus carrier), +RV culture, currently pregnant on their problem list.  Patient reports She would like a note for college so that she can do her classes on line due to her discomfort with walking due to her gravid abdomen..  Contractions: Irregular. Vag. Bleeding: None.  Movement: Present. Denies leaking of fluid.   The following portions of the patient's history were reviewed and updated as appropriate: allergies, current medications, past family history, past medical history, past social history, past surgical history and problem list. Problem list updated.  Objective:   Vitals:   02/20/18 1530  BP: 123/76  Pulse: (!) 108    Fetal Status: Fetal Heart Rate (bpm): 145   Movement: Present     General:  Alert, oriented and cooperative. Patient is in no acute distress.  Skin: Skin is warm and dry. No rash noted.   Cardiovascular: Normal heart rate noted  Respiratory: Normal respiratory effort, no problems with respiration noted  Abdomen: Soft, gravid, appropriate for gestational age.  Pain/Pressure: Present     Pelvic: Cervical exam performed        Extremities: Normal range of motion.  Edema: Trace  Mental Status: Normal mood and affect. Normal behavior. Normal judgment and thought content.    Assessment and Plan:  Pregnancy: G1P0 at [redacted]w[redacted]d  1. GBS (group B Streptococcus carrier), +RV culture, currently pregnant - treat in labor  2. Supervision of normal first pregnancy, antepartum - I have authorized a note as she requested  3. Obesity (BMI 30-39.9)   Preterm labor symptoms and general obstetric precautions including but not limited to vaginal bleeding, contractions, leaking of fluid and fetal movement were reviewed in detail with the patient. Please refer to After Visit Summary for other counseling recommendations.  No follow-ups on file.  No future appointments.  Allie Bossier, MD

## 2018-02-27 ENCOUNTER — Encounter: Payer: Self-pay | Admitting: Family Medicine

## 2018-02-27 ENCOUNTER — Ambulatory Visit (INDEPENDENT_AMBULATORY_CARE_PROVIDER_SITE_OTHER): Payer: BLUE CROSS/BLUE SHIELD | Admitting: Obstetrics & Gynecology

## 2018-02-27 VITALS — BP 117/77 | HR 82

## 2018-02-27 DIAGNOSIS — O99820 Streptococcus B carrier state complicating pregnancy: Secondary | ICD-10-CM

## 2018-02-27 DIAGNOSIS — Z3400 Encounter for supervision of normal first pregnancy, unspecified trimester: Secondary | ICD-10-CM

## 2018-02-27 DIAGNOSIS — Z3403 Encounter for supervision of normal first pregnancy, third trimester: Secondary | ICD-10-CM

## 2018-02-27 DIAGNOSIS — Z3A38 38 weeks gestation of pregnancy: Secondary | ICD-10-CM

## 2018-02-27 NOTE — Progress Notes (Signed)
Pt declined speaking with integrated behavioral [facility].

## 2018-02-27 NOTE — Progress Notes (Signed)
    PRENATAL VISIT NOTE  Subjective:  Tamara Bryant is a 28 y.o. G1P0 at [redacted]w[redacted]d being seen today for ongoing prenatal care.  She is currently monitored for the following issues for this low-risk pregnancy and has Supervision of normal first pregnancy, antepartum; Obesity (BMI 30-39.9); Mild intermittent asthma; Marijuana use; History of chlamydia and gonorrhea ; Depression affecting pregnancy in second trimester, antepartum; Nausea and vomiting in pregnancy; Cannabinoid hyperemesis syndrome (HCC); High blood hemoglobin F (HCC); History of Trichomonas in Pregnancy; Has multiple sexual partners; Pelvic pain affecting pregnancy in third trimester, antepartum; False labor; and GBS (group B Streptococcus carrier), +RV culture, currently pregnant on their problem list.  Patient reports no complaints.  Contractions: Irregular. Vag. Bleeding: None.  Movement: Present. Denies leaking of fluid.   The following portions of the patient's history were reviewed and updated as appropriate: allergies, current medications, past family history, past medical history, past social history, past surgical history and problem list. Problem list updated.  Objective:   Vitals:   02/27/18 1535  BP: 117/77  Pulse: 82    Fetal Status: Fetal Heart Rate (bpm): 151   Movement: Present     General:  Alert, oriented and cooperative. Patient is in no acute distress.  Skin: Skin is warm and dry. No rash noted.   Cardiovascular: Normal heart rate noted  Respiratory: Normal respiratory effort, no problems with respiration noted  Abdomen: Soft, gravid, appropriate for gestational age.  Pain/Pressure: Present     Pelvic: Cervical exam performed        Extremities: Normal range of motion.  Edema: Trace  Mental Status: Normal mood and affect. Normal behavior. Normal judgment and thought content.   Assessment and Plan:  Pregnancy: G1P0 at [redacted]w[redacted]d  1. Supervision of normal first pregnancy, antepartum   2. GBS  (group B Streptococcus carrier), +RV culture, currently pregnant - treat in labor  Term labor symptoms and general obstetric precautions including but not limited to vaginal bleeding, contractions, leaking of fluid and fetal movement were reviewed in detail with the patient. Please refer to After Visit Summary for other counseling recommendations.  No follow-ups on file.  No future appointments.  Allie Bossier, MD

## 2018-02-28 ENCOUNTER — Inpatient Hospital Stay (HOSPITAL_COMMUNITY): Payer: BLUE CROSS/BLUE SHIELD | Admitting: Anesthesiology

## 2018-02-28 ENCOUNTER — Inpatient Hospital Stay (HOSPITAL_COMMUNITY)
Admission: AD | Admit: 2018-02-28 | Discharge: 2018-03-02 | DRG: 806 | Disposition: A | Discharge status: Home / Self Care | Payer: BLUE CROSS/BLUE SHIELD | Attending: Obstetrics & Gynecology | Admitting: Obstetrics & Gynecology

## 2018-02-28 ENCOUNTER — Encounter (HOSPITAL_COMMUNITY): Payer: Self-pay

## 2018-02-28 DIAGNOSIS — F1290 Cannabis use, unspecified, uncomplicated: Secondary | ICD-10-CM | POA: Diagnosis present

## 2018-02-28 DIAGNOSIS — Z3A39 39 weeks gestation of pregnancy: Secondary | ICD-10-CM | POA: Diagnosis not present

## 2018-02-28 DIAGNOSIS — O4290 Premature rupture of membranes, unspecified as to length of time between rupture and onset of labor, unspecified weeks of gestation: Secondary | ICD-10-CM | POA: Diagnosis present

## 2018-02-28 DIAGNOSIS — J4520 Mild intermittent asthma, uncomplicated: Secondary | ICD-10-CM | POA: Diagnosis present

## 2018-02-28 DIAGNOSIS — Z3689 Encounter for other specified antenatal screening: Secondary | ICD-10-CM

## 2018-02-28 DIAGNOSIS — O99824 Streptococcus B carrier state complicating childbirth: Secondary | ICD-10-CM | POA: Diagnosis present

## 2018-02-28 DIAGNOSIS — O9952 Diseases of the respiratory system complicating childbirth: Secondary | ICD-10-CM | POA: Diagnosis present

## 2018-02-28 DIAGNOSIS — Z3400 Encounter for supervision of normal first pregnancy, unspecified trimester: Secondary | ICD-10-CM

## 2018-02-28 DIAGNOSIS — O26893 Other specified pregnancy related conditions, third trimester: Secondary | ICD-10-CM | POA: Diagnosis present

## 2018-02-28 DIAGNOSIS — O99324 Drug use complicating childbirth: Secondary | ICD-10-CM | POA: Diagnosis present

## 2018-02-28 DIAGNOSIS — O99214 Obesity complicating childbirth: Secondary | ICD-10-CM | POA: Diagnosis present

## 2018-02-28 DIAGNOSIS — O4292 Full-term premature rupture of membranes, unspecified as to length of time between rupture and onset of labor: Principal | ICD-10-CM | POA: Diagnosis present

## 2018-02-28 DIAGNOSIS — Z87891 Personal history of nicotine dependence: Secondary | ICD-10-CM | POA: Diagnosis not present

## 2018-02-28 DIAGNOSIS — O99820 Streptococcus B carrier state complicating pregnancy: Secondary | ICD-10-CM

## 2018-02-28 LAB — ABO/RH: ABO/RH(D): A POS

## 2018-02-28 LAB — TYPE AND SCREEN
ABO/RH(D): A POS
Antibody Screen: NEGATIVE

## 2018-02-28 LAB — CBC
HCT: 35.5 % — ABNORMAL LOW (ref 36.0–46.0)
Hemoglobin: 11.8 g/dL — ABNORMAL LOW (ref 12.0–15.0)
MCH: 28.2 pg (ref 26.0–34.0)
MCHC: 33.2 g/dL (ref 30.0–36.0)
MCV: 84.9 fL (ref 80.0–100.0)
Platelets: 304 10*3/uL (ref 150–400)
RBC: 4.18 MIL/uL (ref 3.87–5.11)
RDW: 15.4 % (ref 11.5–15.5)
WBC: 11.4 10*3/uL — ABNORMAL HIGH (ref 4.0–10.5)
nRBC: 0 % (ref 0.0–0.2)

## 2018-02-28 LAB — POCT FERN TEST: POCT FERN TEST: POSITIVE

## 2018-02-28 MED ORDER — EPHEDRINE 5 MG/ML INJ: 10.0000 mg | INTRAVENOUS | Status: DC | PRN

## 2018-02-28 MED ORDER — FENTANYL-BUPIVACAINE-NACL 0.5-0.125-0.9 MG/250ML-% EP SOLN
12.0000 mL/h | EPIDURAL | Status: DC | PRN
  Filled 2018-02-28: qty 250

## 2018-02-28 MED ORDER — SODIUM CHLORIDE 0.9 % IV SOLN
5.0000 10*6.[IU] | Freq: Once | INTRAVENOUS | Status: AC
  Administered 2018-02-28: 5 10*6.[IU] via INTRAVENOUS
  Filled 2018-02-28: qty 5

## 2018-02-28 MED ORDER — OXYTOCIN 40 UNITS IN NORMAL SALINE INFUSION - SIMPLE MED
1.0000 m[IU]/min | INTRAVENOUS | Status: DC
  Administered 2018-02-28: 2 m[IU]/min via INTRAVENOUS

## 2018-02-28 MED ORDER — TERBUTALINE SULFATE 1 MG/ML IJ SOLN: 0.2500 mg | Freq: Once | INTRAMUSCULAR | Status: DC | PRN

## 2018-02-28 MED ORDER — PHENYLEPHRINE 40 MCG/ML (10ML) SYRINGE FOR IV PUSH (FOR BLOOD PRESSURE SUPPORT)
80.0000 ug | PREFILLED_SYRINGE | INTRAVENOUS | Status: DC | PRN
  Filled 2018-02-28: qty 10

## 2018-02-28 MED ORDER — LIDOCAINE HCL (PF) 1 % IJ SOLN
INTRAMUSCULAR | Status: DC | PRN
  Administered 2018-02-28 (×2): 4 mL via EPIDURAL

## 2018-02-28 MED ORDER — OXYTOCIN 40 UNITS IN NORMAL SALINE INFUSION - SIMPLE MED: 1.0000 m[IU]/min | INTRAVENOUS | Status: DC

## 2018-02-28 MED ORDER — DIPHENHYDRAMINE HCL 50 MG/ML IJ SOLN: 12.5000 mg | INTRAMUSCULAR | Status: DC | PRN

## 2018-02-28 MED ORDER — MISOPROSTOL 50MCG HALF TABLET
50.0000 ug | ORAL_TABLET | Freq: Once | ORAL | Status: AC
  Administered 2018-02-28: 50 ug via ORAL
  Filled 2018-02-28: qty 1

## 2018-02-28 MED ORDER — LIDOCAINE HCL (PF) 1 % IJ SOLN: 30.0000 mL | INTRAMUSCULAR | Status: DC | PRN

## 2018-02-28 MED ORDER — LACTATED RINGERS IV SOLN
INTRAVENOUS | Status: DC
  Administered 2018-02-28 (×3): via INTRAVENOUS

## 2018-02-28 MED ORDER — PHENYLEPHRINE 40 MCG/ML (10ML) SYRINGE FOR IV PUSH (FOR BLOOD PRESSURE SUPPORT): 80.0000 ug | PREFILLED_SYRINGE | INTRAVENOUS | Status: DC | PRN

## 2018-02-28 MED ORDER — ONDANSETRON HCL 4 MG/2ML IJ SOLN
4.0000 mg | Freq: Four times a day (QID) | INTRAMUSCULAR | Status: DC | PRN
  Administered 2018-02-28: 4 mg via INTRAVENOUS
  Filled 2018-02-28: qty 2

## 2018-02-28 MED ORDER — SODIUM CHLORIDE (PF) 0.9 % IJ SOLN
INTRAMUSCULAR | Status: DC | PRN
  Administered 2018-02-28: 12 mL/h via EPIDURAL

## 2018-02-28 MED ORDER — OXYCODONE-ACETAMINOPHEN 5-325 MG PO TABS: 1.0000 | ORAL_TABLET | ORAL | Status: DC | PRN

## 2018-02-28 MED ORDER — FENTANYL CITRATE (PF) 100 MCG/2ML IJ SOLN
100.0000 ug | INTRAMUSCULAR | Status: DC | PRN
  Administered 2018-02-28 (×2): 100 ug via INTRAVENOUS
  Filled 2018-02-28 (×2): qty 2

## 2018-02-28 MED ORDER — OXYTOCIN BOLUS FROM INFUSION
500.0000 mL | Freq: Once | INTRAVENOUS | Status: AC
  Administered 2018-03-01: 500 mL via INTRAVENOUS

## 2018-02-28 MED ORDER — LACTATED RINGERS IV SOLN: 500.0000 mL | INTRAVENOUS | Status: DC | PRN

## 2018-02-28 MED ORDER — SOD CITRATE-CITRIC ACID 500-334 MG/5ML PO SOLN: 30.0000 mL | ORAL | Status: DC | PRN

## 2018-02-28 MED ORDER — ACETAMINOPHEN 325 MG PO TABS: 650.0000 mg | ORAL_TABLET | ORAL | Status: DC | PRN

## 2018-02-28 MED ORDER — PENICILLIN G 3 MILLION UNITS IVPB - SIMPLE MED
3.0000 10*6.[IU] | INTRAVENOUS | Status: DC
  Administered 2018-02-28 (×3): 3 10*6.[IU] via INTRAVENOUS
  Filled 2018-02-28 (×3): qty 100

## 2018-02-28 MED ORDER — OXYTOCIN 40 UNITS IN NORMAL SALINE INFUSION - SIMPLE MED
2.5000 [IU]/h | INTRAVENOUS | Status: DC
  Filled 2018-02-28: qty 1000

## 2018-02-28 MED ORDER — OXYCODONE-ACETAMINOPHEN 5-325 MG PO TABS: 2.0000 | ORAL_TABLET | ORAL | Status: DC | PRN

## 2018-02-28 MED ORDER — LACTATED RINGERS IV SOLN: 500.0000 mL | Freq: Once | INTRAVENOUS | Status: DC

## 2018-02-28 MED ORDER — LACTATED RINGERS IV SOLN
500.0000 mL | Freq: Once | INTRAVENOUS | Status: AC
  Administered 2018-02-28: 500 mL via INTRAVENOUS

## 2018-02-28 NOTE — Anesthesia Pain Management Evaluation Note (Signed)
  [facility] Pain Management Visit Note  Patient: Tamara Bryant, 28 y.o., female  "Hello I am a member of the anesthesia team at [facility]. We have an anesthesia team available at all times to provide care throughout the hospital, including epidural management and anesthesia for C-section. I don't know your plan for the delivery whether it a natural birth, water birth, IV sedation, nitrous supplementation, doula or epidural, but we want to meet your pain goals."   1.Was your pain managed to your expectations on prior hospitalizations?   No prior hospitalizations  2.What is your expectation for pain management during this hospitalization?     Labor support without medications, Epidural, IV pain meds and Nitrous Oxide  3.How can we help you reach that goal? Pt open to discussion about all methods of pain control. Questions answered  Record the patient's initial score and the patient's pain goal.   Pain: 2  Pain Goal: 5 [facility] wants you to be able to say your pain was always managed very well.  Shaniah Baltes 02/28/2018

## 2018-02-28 NOTE — H&P (Signed)
Tamara Bryant is a 28 y.o. female, G1P0 at 18 weeks, presenting for LOF.  She states that she has been continuously leaking clear odorless fluid since last night.  Patient states that she has been having contractions that are now intermittent, but was frequent throughout the night. Patient is under the care of  [facility] and was supervised for a low risk pregnancy. Pregnancy and medical history significant for problems as listed below.    Patient Active Problem List   Diagnosis Date Noted  . GBS (group B Streptococcus carrier), +RV culture, currently pregnant 02/18/2018  . False labor 02/15/2018  . Pelvic pain affecting pregnancy in third trimester, antepartum 02/12/2018  . History of Trichomonas in Pregnancy 10/31/2017  . Has multiple sexual partners 10/31/2017  . High blood hemoglobin F (HCC) 10/09/2017  . 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 Nausea and vomiting in pregnancy 09/11/2017  . Cannabinoid hyperemesis syndrome (HCC) 09/11/2017  . Supervision of normal first pregnancy, antepartum 08/13/2017  . Obesity (BMI 30-39.9) 08/13/2017  . Mild intermittent asthma 08/13/2017  . Marijuana use 08/13/2017  . History of chlamydia and gonorrhea  08/13/2017    History of present pregnancy:   Last evaluation:  02/27/2017 in office by Dr. Elsie Bryant  Nursing Staff Provider  Office Location  [facility] Dating   Certain LMP  Language  English Anatomy US   pylectasis, follow up 12/19  Flu Vaccine  09/11/17 Genetic Screen    TDaP vaccine   07/16/17 Hgb A1C or  GTT Early  Third trimester  A1C 4.4  Rhogam  A positive    LAB RESULTS   Feeding Plan Breast  Blood Type A/Positive/-- (08/12 1104)   Contraception Undecided, info given. Antibody Negative (08/12 1104)  Circumcision Girl! Rubella <0.90 (08/12 1104) Non immune   Pediatrician  List Given RPR Non Reactive (08/12 1104)   Support Person Tamara Bryant(Friend) HBsAg Negative (08/12 1104)   Prenatal Classes offered  HIV Non Reactive (08/12 1104)  BTL Consent n/a GBS   Positive  VBAC Consent n/a Pap 09/19/16: normal    Hgb Electro  Hemoglobin pattern and concentrations reveal an elevation of Hgb F  which may indicate the presence of a hereditary persistence gene.  However, elevations of Hgb F have also been reported to occur with  certain anemias, pregnancy, porphyrias, and some malignancies.     CF  Negative for 32 mutations analyzed    SMA  2    Waterbirth  [ ]  Class [ ]  Consent [ ]  CNM visit      OB History    Gravida  1   Para      Term      Preterm      AB      Living  0     SAB      TAB      Ectopic      Multiple      Live Births                Past Medical History:  Diagnosis Date  . Anemia   . Asthma   . History of tobacco use   . Marijuana use   . Obesity   . Seasonal allergies    Past Surgical History:  Procedure Laterality Date  . WISDOM TOOTH EXTRACTION     Family History: family history includes Diabetes in her father, maternal grandmother, and paternal grandfather; Hypertension in her father and mother; Sickle cell anemia in  her mother. Social History:  reports that she has quit smoking. She smoked 1.00 pack per day. She quit smokeless tobacco use about 7 months ago. She reports previous alcohol use. She reports current drug use. Drug: Marijuana.   Prenatal Transfer Tool  Maternal Diabetes: No Genetic Screening: Normal Maternal Ultrasounds/Referrals: Normal Fetal Ultrasounds or other Referrals:  None Maternal Substance Abuse:  No Significant Maternal Medications:  None Significant Maternal Lab Results: None   Maternal Assessment:  ROS: +Contractions, +LOF, -Vaginal Bleeding, +Fetal Movement  All other systems reviewed and negative.    No Known Allergies   Dilation: 1 Effacement (%): 50 Exam by:: Tamara Bryant cnm Blood pressure 119/85, pulse 81, temperature 98.7 F (37.1 C), resp. rate 18, height 5\' 4"  (1.626 m), weight 103.4 kg, last  menstrual period 05/24/2017, unknown if currently breastfeeding.  Physical Exam  Constitutional: She is oriented to person, place, and time. She appears well-developed and well-nourished. No distress.  HENT:  Head: Normocephalic and atraumatic.  Eyes: Conjunctivae are normal.  Neck: Normal range of motion.  Cardiovascular: Normal rate, regular rhythm and normal heart sounds.  Respiratory: Effort normal and breath sounds normal.  GI: Soft. Bowel sounds are normal.  Genitourinary:    No vaginal discharge or bleeding.  No bleeding in the vagina.    Genitourinary Comments: Speculum Exam: -Vaginal Vault: Pink mucosa.  Small amt clear fluid in vault -sample collected -Cervix:Pink, no lesions, bleeding noted. Appears slightly open. Mucoid discharge from os-sample collected -Bimanual Exam: 1/50/-2 Soft, Middle   Musculoskeletal: Normal range of motion.  Neurological: She is alert and oriented to person, place, and time.  Skin: Skin is warm and dry.  Psychiatric: She has a normal mood and affect. Her behavior is normal.    Fetal Assessment: Leopolds: -Pelvis: Adequate -EFW: Not Evaluated -Presentation: Cephalic  FHR: 135 bpm, Mod Var, -Decels, +Accels UCs:  Occassional    Assessment IUP at 39 weeks Cat I FT SROM GBS Positive   Plan: Admit to Tamara Bryant  Report given to Tamara Bryant, CNM  Routine Labor and Delivery Orders per Protocol to be placed by [facility] Provider In room to complete assessment and discuss POC: Patient informed of ruptured membranes and admission. Expresses excitement, but is without questions or concerns.    Tamara Quails, MSN 02/28/2018, 9:11 AM

## 2018-02-28 NOTE — Anesthesia Preprocedure Evaluation (Signed)
Anesthesia Evaluation  Patient identified by MRN, date of birth, ID band Patient awake    Reviewed: Allergy & Precautions, Patient's Chart, lab work & pertinent test results  History of Anesthesia Complications Negative for: history of anesthetic complications  Airway Mallampati: II  TM Distance: >3 FB Neck ROM: Full    Dental  (+) Teeth Intact, Dental Advisory Given   Pulmonary asthma , former smoker,    Pulmonary exam normal breath sounds clear to auscultation       Cardiovascular negative cardio ROS Normal cardiovascular exam Rhythm:Regular Rate:Normal     Neuro/Psych Depression negative neurological ROS     GI/Hepatic negative GI ROS, Neg liver ROS,   Endo/Other  negative endocrine ROS  Renal/GU negative Renal ROS     Musculoskeletal negative musculoskeletal ROS (+)   Abdominal   Peds  Hematology negative hematology ROS (+)   Anesthesia Other Findings Day of surgery medications reviewed with the patient.  Reproductive/Obstetrics (+) Pregnancy                             Anesthesia Physical Anesthesia Plan  ASA: II  Anesthesia Plan: Epidural   Post-op Pain Management:    Induction:   PONV Risk Score and Plan: Treatment may vary due to age or medical condition  Airway Management Planned: Natural Airway  Additional Equipment:   Intra-op Plan:   Post-operative Plan:   Informed Consent: I have reviewed the patients History and Physical, chart, labs and discussed the procedure including the risks, benefits and alternatives for the proposed anesthesia with the patient or authorized representative who has indicated his/her understanding and acceptance.       Plan Discussed with: CRNA  Anesthesia Plan Comments:         Anesthesia Quick Evaluation

## 2018-02-28 NOTE — Anesthesia Procedure Notes (Signed)
Epidural Patient location during procedure: OB Start time: 02/28/2018 5:43 PM End time: 02/28/2018 5:46 PM  Staffing Anesthesiologist: Kaylyn Layer, MD Performed: anesthesiologist   Preanesthetic Checklist Completed: patient identified, pre-op evaluation, timeout performed, IV checked, risks and benefits discussed and monitors and equipment checked  Epidural Patient position: sitting Prep: site prepped and draped and DuraPrep Patient monitoring: continuous pulse ox, blood pressure, heart rate and cardiac monitor Approach: midline Location: L3-L4 Injection technique: LOR air  Needle:  Needle type: Tuohy  Needle gauge: 17 G Needle length: 9 cm Needle insertion depth: 7 cm Catheter type: closed end flexible Catheter size: 19 Gauge Catheter at skin depth: 12 cm Test dose: negative and Other (1% lidocaine)  Assessment Events: blood not aspirated, injection not painful, no injection resistance, negative IV test and no paresthesia  Additional Notes Patient identified. Risks, benefits, and alternatives discussed with patient including but not limited to bleeding, infection, nerve damage, paralysis, failed block, incomplete pain control, headache, blood pressure changes, nausea, vomiting, reactions to medication, itching, and postpartum back pain. Confirmed with bedside nurse the patient's most recent platelet count. Confirmed with patient that they are not currently taking any anticoagulation, have any bleeding history, or any family history of bleeding disorders. Patient expressed understanding and wished to proceed. All questions were answered. Sterile technique was used throughout the entire procedure. Crisp LOR after one needle redirection. Please see nursing notes for vital signs. Test dose was given through epidural catheter and negative prior to continuing to dose epidural or start infusion. Warning signs of high block given to the patient including shortness of breath,  tingling/numbness in hands, complete motor block, or any concerning symptoms with instructions to call for help. Patient was given instructions on fall risk and not to get out of bed. All questions and concerns addressed with instructions to call with any issues or inadequate analgesia.  Reason for block:procedure for pain

## 2018-02-28 NOTE — Progress Notes (Addendum)
C/o some pressure.  cx 8/100/-1. AROM forebag.  FHR Cat 1.  Ctx q 2-3 minutes.  WIll continue present mgt.

## 2018-02-28 NOTE — Progress Notes (Signed)
Lots of pressure.  Cx still 8cms, but vtx down to 0 station.  IUPC placed.  MVUs ~ 150 w/irregular ctx. FHR Cat 1.  Will increase Pitocin, epidural bolus.

## 2018-02-28 NOTE — Progress Notes (Addendum)
Tamara Bryant is a 28 y.o. G1P0 at [redacted]w[redacted]d by LMP admitted for PROM  Subjective: Patient is resting and tolerating contractions well.  She appears in no distress.    Objective: BP 114/82   Pulse 96   Temp 98.7 F (37.1 C)   Resp 18   Ht 5\' 4"  (1.626 m)   Wt 103.4 kg   LMP 05/24/2017   BMI 39.14 kg/m  No intake/output data recorded. No intake/output data recorded.  FHT:  FHR: 130 bpm, variability: moderate,  accelerations:  Present,  decelerations:  Absent UC:   irregular, every 4-7 minutes SVE:   Dilation: 1 Effacement (%): 50 Exam by:: emly cnm  Labs: Lab Results  Component Value Date   WBC 11.4 (H) 02/28/2018   HGB 11.8 (L) 02/28/2018   HCT 35.5 (L) 02/28/2018   MCV 84.9 02/28/2018   PLT 304 02/28/2018    Assessment / Plan: Spontaneous labor, progressing normally  Labor: Discussed labor progresssion with patient.  Will recheck cervix at 2:00 PM and then begin cytotec at that time if indicated.   Preeclampsia:  no signs or symptoms of toxicity Fetal Wellbeing:  Category I Pain Control:  Planning labor support without medications, epidural upon maternal request I/D:  GBS positive, PCN Anticipated MOD:  NSVD  [name] 02/28/2018, 12:41 PM   I confirm that I have verified the information documented in the physician assistant student's note and that I have also personally reperformed the history, physical exam and all medical decision making activities of this service and have verified that all service and findings are accurately documented in this student's note.   Discussed cervical ripening with pt, who agrees to Cytotec at 1330.  Bedside US confirms vertex position.    Hurshel Party, CNM 02/28/2018 4:13 PM

## 2018-02-28 NOTE — MAU Note (Signed)
Pt reports she has been "leaking all night" Having some mild contractions that are irregular. Good fetal movement reported.

## 2018-02-28 NOTE — Progress Notes (Signed)
Tamara Bryant is a 28 y.o. G1P0 at [redacted]w[redacted]d by LMP admitted for PROM  Subjective: Patient is progressing well.  She reports that her pain is improving following epidural placement.  Objective: BP 127/81   Pulse 81   Temp 98.2 F (36.8 C) (Oral)   Resp 20   Ht 5\' 4"  (1.626 m)   Wt 103.4 kg   LMP 05/24/2017   BMI 39.14 kg/m  No intake/output data recorded. No intake/output data recorded.  FHT:  FHR: 130 bpm, variability: moderate,  accelerations:  Present,  decelerations:  Absent UC:   irregular, every 4-5 minutes SVE:   Dilation: 5 Effacement (%): 100 Station: -2 Exam by:: m wilkins rnc  Labs: Lab Results  Component Value Date   WBC 11.4 (H) 02/28/2018   HGB 11.8 (L) 02/28/2018   HCT 35.5 (L) 02/28/2018   MCV 84.9 02/28/2018   PLT 304 02/28/2018    Assessment / Plan: Augmentation of labor, progressing well  Labor: Patient is progressing well.  She will begin treatment with pitocin.  Fetal Wellbeing:  Category I Pain Control:  Epidural I/D:  GBS positive, PCN Anticipated MOD:  NSVD  [name] 02/28/2018, 6:20 PM

## 2018-03-01 ENCOUNTER — Encounter (HOSPITAL_COMMUNITY): Payer: Self-pay

## 2018-03-01 ENCOUNTER — Other Ambulatory Visit: Payer: Self-pay

## 2018-03-01 DIAGNOSIS — Z3A39 39 weeks gestation of pregnancy: Secondary | ICD-10-CM

## 2018-03-01 DIAGNOSIS — O99824 Streptococcus B carrier state complicating childbirth: Secondary | ICD-10-CM

## 2018-03-01 LAB — RPR: RPR Ser Ql: NONREACTIVE

## 2018-03-01 MED ORDER — METHYLERGONOVINE MALEATE 0.2 MG PO TABS: 0.2000 mg | ORAL_TABLET | ORAL | Status: DC | PRN

## 2018-03-01 MED ORDER — BENZOCAINE-MENTHOL 20-0.5 % EX AERO: 1.0000 "application " | INHALATION_SPRAY | CUTANEOUS | Status: DC | PRN

## 2018-03-01 MED ORDER — FLEET ENEMA 7-19 GM/118ML RE ENEM: 1.0000 | ENEMA | Freq: Every day | RECTAL | Status: DC | PRN

## 2018-03-01 MED ORDER — FERROUS SULFATE 325 (65 FE) MG PO TABS
325.0000 mg | ORAL_TABLET | Freq: Two times a day (BID) | ORAL | Status: DC
  Administered 2018-03-01 – 2018-03-02 (×2): 325 mg via ORAL
  Filled 2018-03-01 (×2): qty 1

## 2018-03-01 MED ORDER — ONDANSETRON HCL 4 MG/2ML IJ SOLN: 4.0000 mg | INTRAMUSCULAR | Status: DC | PRN

## 2018-03-01 MED ORDER — BISACODYL 10 MG RE SUPP: 10.0000 mg | Freq: Every day | RECTAL | Status: DC | PRN

## 2018-03-01 MED ORDER — ACETAMINOPHEN 325 MG PO TABS
650.0000 mg | ORAL_TABLET | ORAL | Status: DC | PRN
  Administered 2018-03-01: 650 mg via ORAL
  Filled 2018-03-01 (×2): qty 2

## 2018-03-01 MED ORDER — IBUPROFEN 600 MG PO TABS
600.0000 mg | ORAL_TABLET | Freq: Four times a day (QID) | ORAL | Status: DC
  Administered 2018-03-01 – 2018-03-02 (×6): 600 mg via ORAL
  Filled 2018-03-01 (×7): qty 1

## 2018-03-01 MED ORDER — COCONUT OIL OIL: 1.0000 "application " | TOPICAL_OIL | Status: DC | PRN

## 2018-03-01 MED ORDER — ONDANSETRON HCL 4 MG PO TABS: 4.0000 mg | ORAL_TABLET | ORAL | Status: DC | PRN

## 2018-03-01 MED ORDER — OXYCODONE HCL 5 MG PO TABS
10.0000 mg | ORAL_TABLET | ORAL | Status: DC | PRN
  Administered 2018-03-01: 10 mg via ORAL
  Filled 2018-03-01: qty 2

## 2018-03-01 MED ORDER — SIMETHICONE 80 MG PO CHEW: 80.0000 mg | CHEWABLE_TABLET | ORAL | Status: DC | PRN

## 2018-03-01 MED ORDER — ZOLPIDEM TARTRATE 5 MG PO TABS: 5.0000 mg | ORAL_TABLET | Freq: Every evening | ORAL | Status: DC | PRN

## 2018-03-01 MED ORDER — DIPHENHYDRAMINE HCL 25 MG PO CAPS: 25.0000 mg | ORAL_CAPSULE | Freq: Four times a day (QID) | ORAL | Status: DC | PRN

## 2018-03-01 MED ORDER — DOCUSATE SODIUM 100 MG PO CAPS
100.0000 mg | ORAL_CAPSULE | Freq: Two times a day (BID) | ORAL | Status: DC
  Administered 2018-03-02 (×2): 100 mg via ORAL
  Filled 2018-03-01 (×2): qty 1

## 2018-03-01 MED ORDER — DIBUCAINE 1 % RE OINT: 1.0000 "application " | TOPICAL_OINTMENT | RECTAL | Status: DC | PRN

## 2018-03-01 MED ORDER — METHYLERGONOVINE MALEATE 0.2 MG/ML IJ SOLN: 0.2000 mg | INTRAMUSCULAR | Status: DC | PRN

## 2018-03-01 MED ORDER — MEASLES, MUMPS & RUBELLA VAC IJ SOLR
0.5000 mL | Freq: Once | INTRAMUSCULAR | Status: AC
  Administered 2018-03-02: 0.5 mL via SUBCUTANEOUS

## 2018-03-01 MED ORDER — TETANUS-DIPHTH-ACELL PERTUSSIS 5-2.5-18.5 LF-MCG/0.5 IM SUSP: 0.5000 mL | Freq: Once | INTRAMUSCULAR | Status: DC

## 2018-03-01 MED ORDER — PRENATAL MULTIVITAMIN CH
1.0000 | ORAL_TABLET | Freq: Every day | ORAL | Status: DC
  Administered 2018-03-02: 1 via ORAL
  Filled 2018-03-01: qty 1

## 2018-03-01 MED ORDER — WITCH HAZEL-GLYCERIN EX PADS: 1.0000 "application " | MEDICATED_PAD | CUTANEOUS | Status: DC | PRN

## 2018-03-01 NOTE — Anesthesia Postprocedure Evaluation (Signed)
Anesthesia Post Note  Patient: Tamara Bryant  Procedure(s) Performed: AN AD HOC LABOR EPIDURAL     Patient location during evaluation: Mother Baby Anesthesia Type: Epidural Level of consciousness: awake and alert Pain management: pain level controlled Vital Signs Assessment: post-procedure vital signs reviewed and stable Respiratory status: spontaneous breathing, nonlabored ventilation and respiratory function stable Cardiovascular status: stable Postop Assessment: no headache, no backache, epidural receding, no apparent nausea or vomiting, patient able to bend at knees, able to ambulate and adequate PO intake Anesthetic complications: no    Last Vitals:  Vitals:   03/01/18 0744 03/01/18 1145  BP: 113/80 122/80  Pulse: 70 86  Resp: 15 15  Temp: 36.9 C 37.4 C  SpO2: 99% 99%    Last Pain:  Vitals:   03/01/18 1145  TempSrc: Oral  PainSc: 7    Pain Goal:                   [place]

## 2018-03-01 NOTE — Discharge Summary (Signed)
Postpartum Discharge Summary     Patient Name: Tamara Bryant DOB: 1990-11-05 MRN: [id number]  Date of admission: 02/28/2018 Delivering Provider: Christin Fudge   Date of discharge: 03/02/2018  Admitting diagnosis: 27WKS LEAKING FLUID Intrauterine pregnancy: [redacted]w[redacted]d    Secondary diagnosis:  Active Problems:   Mild intermittent asthma   Marijuana use   GBS (group B Streptococcus carrier), +RV culture, currently pregnant   PROM (premature rupture of membranes)  Additional problems: none     Discharge diagnosis: Term Pregnancy Delivered                                                                                                Post partum procedures:MMR recommended prior to d/c  Augmentation: AROM and Pitocin, cytotec  Complications: None  Hospital course:  Onset of Labor With Vaginal Delivery     28y.o. yo G1P0 at 363w1das admitted in Latent Labor/PROM on 02/28/2018. She was given a cytotec for cx ripening followed by Pit and AROM in her progression to SVD. She rec'd adequate GBS ppx.   Membrane Rupture Time/Date: 8:47 PM ,02/28/2018   Intrapartum Procedures: Episiotomy: None [1]                                         Lacerations:  2nd degree [3];Labial [10]  Patient had a delivery of a Viable infant. 03/01/2018  Information for the patient's newborn:  JoAdelee, Hannula0[[id number]]     Pateint had an uncomplicated postpartum course.  She is ambulating, tolerating a regular diet, passing flatus, and urinating well. Patient is discharged home in stable condition on 03/02/18 per her request for early discharge.   Magnesium Sulfate recieved: No BMZ received: No  Physical exam  Vitals:   03/01/18 1145 03/01/18 1550 03/01/18 2121 03/02/18 0530  BP: 122/80 (!) 128/94 112/68 122/86  Pulse: 86 93 92 96  Resp: '15 16 16 16  '$ Temp: 99.3 F (37.4 C) 98.7 F (37.1 C) 98.5 F (36.9 C) 98.6 F (37 C)  TempSrc: Oral Oral Oral Oral  SpO2: 99%  100% 100%   Weight:      Height:       General: alert and cooperative Lochia: appropriate Uterine Fundus: firm Incision: N/A DVT Evaluation: No evidence of DVT seen on physical exam. Labs: Lab Results  Component Value Date   WBC 11.4 (H) 02/28/2018   HGB 11.8 (L) 02/28/2018   HCT 35.5 (L) 02/28/2018   MCV 84.9 02/28/2018   PLT 304 02/28/2018   CMP Latest Ref Rng & Units 10/31/2017  Glucose 65 - 99 mg/dL 76  BUN 6 - 20 mg/dL 7  Creatinine 0.57 - 1.00 mg/dL 0.52(L)  Sodium 134 - 144 mmol/L 136  Potassium 3.5 - 5.2 mmol/L 3.6  Chloride 96 - 106 mmol/L 103  CO2 20 - 29 mmol/L 20  Calcium 8.7 - 10.2 mg/dL 8.9  Total Protein 6.0 - 8.5 g/dL 6.0  Total Bilirubin 0.0 - 1.2 mg/dL 0.4  Alkaline Phos 39 - 117 IU/L 75  AST 0 - 40 IU/L 12  ALT 0 - 32 IU/L 10    Discharge instruction: per After Visit Summary and "Baby and Me Booklet".  After visit meds:  Allergies as of 03/02/2018   No Known Allergies     Medication List    STOP taking these medications   metoCLOPramide 10 MG tablet Commonly known as:  REGLAN   promethazine 12.5 MG tablet Commonly known as:  PHENERGAN   ZANTAC PO     TAKE these medications   albuterol 108 (90 Base) MCG/ACT inhaler Commonly known as:  PROVENTIL HFA;VENTOLIN HFA Inhale 2 puffs into the lungs every 6 (six) hours as needed for wheezing or shortness of breath.   ibuprofen 600 MG tablet Commonly known as:  ADVIL,MOTRIN Take 1 tablet (600 mg total) by mouth every 6 (six) hours as needed.   PRENATAL VITAMIN PLUS LOW IRON 27-1 MG Tabs Take 1 tablet by mouth daily.   sertraline 50 MG tablet Commonly known as:  ZOLOFT Take 1 tablet (50 mg total) by mouth daily.       Diet: routine diet  Activity: Advance as tolerated. Pelvic rest for 6 weeks.   Outpatient follow up:4 weeks Follow up Appt: Future Appointments  Date Time Provider Nina  03/06/2018  2:15 PM Emily Filbert, MD [place]   Follow up Visit: [place] for [facility]. Schedule an appointment as soon as possible for a visit in 4 week(s).   Specialty:  Obstetrics and Gynecology Why:  for your postpartum appointment Contact information: [place] [phone]           Please schedule this patient for Postpartum visit in: 4 weeks with the following provider: Any provider For C/S patients schedule nurse incision check in weeks 2 weeks:   pregnancy complicated by:  Delivery mode:  SVD Anticipated Birth Control:  POPs PP Procedures needed:   Schedule Integrated BH visit: no      Newborn Data: Live born female  Birth Weight:  40gm APGAR: 9, 9  Newborn Delivery   Birth date/time:  03/01/2018 00:33:00 Delivery type:  Vaginal, Spontaneous     Baby Feeding: Bottle and Breast Disposition:home with mother   03/02/2018 Myrtis Ser, CNM  7:41 AM

## 2018-03-01 NOTE — Lactation Note (Signed)
This note was copied from a baby's chart. Lactation Consultation Note  Patient Name: Tamara Bryant Date: 03/01/2018 Reason for consult: Follow-up assessment;Mother's request;Difficult latch P1, 22 hour female infant. Per mom, infant is wanting to sleep and not nurse at breast. Per mom, infant had 4 voids and 3 stools since delivery. Per grandmother,  infant had a lot of emesis episodes since birth. LC notice mom is short shafted and fitted mom with breast shells, mom knows to wear them in her  bra during day and not sleep in them at night. Infant was cuing to breast feed when LC entered room but very fussy and refusing to latch at first or suckle on glove finger mostly biting. LC ask mom hand express mom expressed 2 ml of colostrum that was spoon feed and then infant began suckling on gloved  finger .  LC had mom do breast stimulation and little hand expression of colostrum on breast  prior to latching infant. Mom latched infant on right breast using the cross cradle hold, infant latched with wide mouth gape and few swallows heard, infant breastfeed for 13 minutes and was still breastfeeding when LC left the room. Mom is feeling more confident with breastfeeding. She will try breast stimulation and hand expression prior to latching infant to breast. Mom will breastfeed according to hunger cues and or 8 or more times within 24 hours. Mom knows to call Nurse or LC if she has any breastfeeding questions, concerns or need assistance with latching infant to breast.    Maternal Data Formula Feeding for Exclusion: Yes Reason for exclusion: Mother's choice to formula and breast feed on admission  Feeding Feeding Type: Breast Fed  LATCH Score Latch: Grasps breast easily, tongue down, lips flanged, rhythmical sucking.  Audible Swallowing: Spontaneous and intermittent  Type of Nipple: Everted at rest and after stimulation  Comfort (Breast/Nipple): Soft / non-tender  Hold  (Positioning): Assistance needed to correctly position infant at breast and maintain latch.  LATCH Score: 9  Interventions Interventions: Assisted with latch;Breast massage;Support pillows;Adjust position;Breast compression;Shells  Lactation Tools Discussed/Used Tools: Shells   Consult Status Consult Status: Follow-up Date: 03/02/18 Follow-up type: In-patient    Danelle Earthly 03/01/2018, 10:45 PM

## 2018-03-01 NOTE — Lactation Note (Signed)
This note was copied from a baby's chart. Lactation Consultation Note:  Arrived in mothers room with Grandmother assisting mother with breastfeeding.  Infant repositioned with good pillow support.  Several attempts to latch infant. Infant had very shallow latch only taking the tip of the nipple.  Assist with a tea-cup hold and infant latched with deeper latch.  Infant biting and mother complained of pain.  Gentle massage on infants jaw.  Infant began suckling with smoother rhythm .   Infant sustained latch for 10-15 mins. Observed round nipple when infant released the breast.  Mother very sleepy.  Discussed cue base feeding. Advised mother to breastfeed infant 8-12 times or more in 24 hours. Discussed cluster feeding.   Lactation brochure given to mother and basic breastfeeding teaching done. Mother informed of all available LC services and community support.   Patient Name: Tamara Bryant CLEXN'T Date: 03/01/2018 Reason for consult: Follow-up assessment   Maternal Data    Feeding Feeding Type: Breast Fed  LATCH Score Latch: Repeated attempts needed to sustain latch, nipple held in mouth throughout feeding, stimulation needed to elicit sucking reflex.  Audible Swallowing: A few with stimulation  Type of Nipple: Everted at rest and after stimulation  Comfort (Breast/Nipple): Soft / non-tender  Hold (Positioning): Assistance needed to correctly position infant at breast and maintain latch.  LATCH Score: 7  Interventions Interventions: Breast feeding basics reviewed;Assisted with latch;Skin to skin;Hand express;Breast compression;Adjust position;Support pillows;Position options;Expressed milk  Lactation Tools Discussed/Used     Consult Status Consult Status: Follow-up Date: 03/01/18 Follow-up type: In-patient    Stevan Born [facility] 03/01/2018, 2:03 PM

## 2018-03-01 NOTE — Progress Notes (Signed)
Patient out of shower.  Changed linens.

## 2018-03-01 NOTE — Progress Notes (Signed)
Lactation at bedside. °

## 2018-03-02 MED ORDER — IBUPROFEN 600 MG PO TABS: 600.0000 mg | ORAL_TABLET | Freq: Four times a day (QID) | ORAL | 0 refills | Status: DC | PRN

## 2018-03-02 NOTE — Progress Notes (Signed)
Patient called out with questions about breastfeeding. Patient was starting to feel discouraged with breastfeeding. Patient asked about pumping and asked for formula. LEAD discussed with patient and patient set up with double electric pump. RN encouraged mother to attempt to latch baby first before trying anything else. If baby would not latch or struggled to latch, patient encouraged to pump and give any expressed milk back to baby.

## 2018-03-02 NOTE — Clinical Social Work Maternal (Signed)
CLINICAL SOCIAL WORK MATERNAL/CHILD NOTE  Patient Details  Name: Tamara Bryant MRN: [id number] Date of Birth: 1990/04/21  Date:  03/02/2018  Clinical Social Worker Initiating Note:  Madilyn Fireman, MSW, LCSW-A Date/Time: Initiated:  03/02/18/1623     Child's Name:  Tamara Bryant   Biological Parents:  Mother, Father   Need for Interpreter:  None   Reason for Referral:  Current Substance Use/Substance Use During Pregnancy    Address:  [place] [address]    Phone number:  [phone] (home)     Additional phone number:   Household Members/Support Persons (HM/SP):   Household Member/Support Person 1   HM/SP Name Relationship DOB or Age  HM/SP -[place] Mother    HM/SP -2        HM/SP -3        HM/SP -4        HM/SP -5        HM/SP -6        HM/SP -7        HM/SP -8          Natural Supports (not living in the home):  Extended Family, Immediate Family, Friends   Chiropodist: None   Employment:     Type of Work:     Education:  Programmer, systems   Homebound arranged:    Museum/gallery curator Resources:  Multimedia programmer   Other Resources:  [facility]   Cultural/Religious Considerations Which May Impact Care:  Christian  Strengths:  Ability to meet basic needs , Home prepared for child , Pediatrician chosen   Psychotropic Medications:   None      Pediatrician:    [place]  Pediatrician List:   [facility] [place]      Pediatrician Fax Number:    Risk Factors/Current Problems:      Cognitive State:  Alert , Able to Concentrate    Mood/Affect:  Comfortable , Calm , Happy    CSW Assessment: CSW received consult for MOB due to history of marijuana use. CSW met with MOB and infant Zara at bedside to complete assessment. MOB had her mother and sister present, CSW obtained permission to discuss with  family present. MOB stated that her mother, Tamara Bryant was Clinical biochemist of Social Services in [place], [place]. MOB was educated on hospital drug screening policies due to history of substance use. MOB was informed that her infant was positive for THC. CSW informed MOB of mandated report to [facility] CPS. MOB reports that this is her first child and that FOB is involved but she did not want to name him. MOB reports that she has all necessary items at home to care for infant. New car seat was in the room, ready for use for transportation of infant. MOB reports knowing how to install and utilize car seat. MOB reports that Zara will sleep in a pack and play. MOB was educated on safe sleep and SIDS precautions. MOB has history of depression and took Zoloft during pregnancy but has since ceased medication. MOB denies the need for medication. MOB was educated on baby blues period versus postpartum depression. MOB stated understanding and agreed to be vigilant in watching herself for any sign or symptom. MOB also agreeable to reach out to prenatal care provider at [facility] for assistance. MOB reports  that infant will receive care at [facility] by Dr. Michel Santee. MOB receives [facility] and has private insurance. MOB did not have questions or concerns at this time.  CSW made report to Chanetta Marshall, [facility] CPS. Report was accepted, follow up will occur during the business week. No barriers to discharge.  CSW Plan/Description:  No Further Intervention Required/No Barriers to Discharge, CSW Will Continue to Monitor Umbilical Cord Tissue Drug Screen Results and Make Report if [place], Child Protective Service Report     Archie Endo, [place] 03/02/2018, 4:28 PM

## 2018-03-02 NOTE — Lactation Note (Signed)
This note was copied from a baby's chart. Lactation Consultation Note  Patient Name: Tamara Bryant Date: 03/02/2018 Reason for consult: Follow-up assessment;Primapara;Term Baby is 36 hours old/7% weight loss.  Mom would like to go home today.  She states the last two breastfeedings went well and she was able to independently latch baby. She gave formula twice during the night when baby did not feed well.  Assisted with positioning baby in football hold on the right side.  Mom has large nipples.  She can easily hand express colostrum.  Mom has good technique with positioning and hand placement.  Baby sleepy and waking techniques done.  Attempted to latch for 20 minutes and baby had some difficulty sustaining latch.  Allowed baby to have a few swallows of formula and then baby latched well.  Observed feeding for 10 minutes and baby was still feeding when I left.  Good active suck/swallows observed.  Discussed giving expressed breast milk/formula if baby won't latch after 3 hours and pump breasts.  Mom has a breast pump at home.  Outpatient lactation services and support reviewed and encouraged.  Maternal Data    Feeding Feeding Type: Breast Fed  LATCH Score Latch: Grasps breast easily, tongue down, lips flanged, rhythmical sucking.  Audible Swallowing: Spontaneous and intermittent  Type of Nipple: Everted at rest and after stimulation  Comfort (Breast/Nipple): Soft / non-tender  Hold (Positioning): Assistance needed to correctly position infant at breast and maintain latch.  LATCH Score: 9  Interventions Interventions: Assisted with latch;Skin to skin;Expressed milk;Breast massage;Hand express;Shells;Breast compression  Lactation Tools Discussed/Used Tools: Shells Shell Type: Inverted   Consult Status Consult Status: Follow-up Date: 03/03/18 Follow-up type: In-patient    [name] 03/02/2018, 1:16 PM

## 2018-03-02 NOTE — Discharge Instructions (Signed)
Vaginal Delivery, Care After °Refer to this sheet in the next few weeks. These instructions provide you with information about caring for yourself after vaginal delivery. Your health care provider may also give you more specific instructions. Your treatment has been planned according to current medical practices, but problems sometimes occur. Call your health care provider if you have any problems or questions. °What can I expect after the procedure? °After vaginal delivery, it is common to have: °· Some bleeding from your vagina. °· Soreness in your abdomen, your vagina, and the area of skin between your vaginal opening and your anus (perineum). °· Pelvic cramps. °· Fatigue. °Follow these instructions at home: °Medicines °· Take over-the-counter and prescription medicines only as told by your health care provider. °· If you were prescribed an antibiotic medicine, take it as told by your health care provider. Do not stop taking the antibiotic until it is finished. °Driving ° °· Do not drive or operate heavy machinery while taking prescription pain medicine. °· Do not drive for 24 hours if you received a sedative. °Lifestyle °· Do not drink alcohol. This is especially important if you are breastfeeding or taking medicine to relieve pain. °· Do not use tobacco products, including cigarettes, chewing tobacco, or e-cigarettes. If you need help quitting, ask your health care provider. °Eating and drinking °· Drink at least 8 eight-ounce glasses of water every day unless you are told not to by your health care provider. If you choose to breastfeed your baby, you may need to drink more water than this. °· Eat high-fiber foods every day. These foods may help prevent or relieve constipation. High-fiber foods include: °? Whole grain cereals and breads. °? Brown rice. °? Beans. °? Fresh fruits and vegetables. °Activity °· Return to your normal activities as told by your health care provider. Ask your health care provider what  activities are safe for you. °· Rest as much as possible. Try to rest or take a nap when your baby is sleeping. °· Do not lift anything that is heavier than your baby or 10 lb (4.5 kg) until your health care provider says that it is safe. °· Talk with your health care provider about when you can engage in sexual activity. This may depend on your: °? Risk of infection. °? Rate of healing. °? Comfort and desire to engage in sexual activity. °Vaginal Care °· If you have an episiotomy or a vaginal tear, check the area every day for signs of infection. Check for: °? More redness, swelling, or pain. °? More fluid or blood. °? Warmth. °? Pus or a bad smell. °· Do not use tampons or douches until your health care provider says this is safe. °· Watch for any blood clots that may pass from your vagina. These may look like clumps of dark red, brown, or black discharge. °General instructions °· Keep your perineum clean and dry as told by your health care provider. °· Wear loose, comfortable clothing. °· Wipe from front to back when you use the toilet. °· Ask your health care provider if you can shower or take a bath. If you had an episiotomy or a perineal tear during labor and delivery, your health care provider may tell you not to take baths for a certain length of time. °· Wear a bra that supports your breasts and fits you well. °· If possible, have someone help you with household activities and help care for your baby for at least a few days after you   leave the hospital. °· Keep all follow-up visits for you and your baby as told by your health care provider. This is important. °Contact a health care provider if: °· You have: °? Vaginal discharge that has a bad smell. °? Difficulty urinating. °? Pain when urinating. °? A sudden increase or decrease in the frequency of your bowel movements. °? More redness, swelling, or pain around your episiotomy or vaginal tear. °? More fluid or blood coming from your episiotomy or vaginal  tear. °? Pus or a bad smell coming from your episiotomy or vaginal tear. °? A fever. °? A rash. °? Little or no interest in activities you used to enjoy. °? Questions about caring for yourself or your baby. °· Your episiotomy or vaginal tear feels warm to the touch. °· Your episiotomy or vaginal tear is separating or does not appear to be healing. °· Your breasts are painful, hard, or turn red. °· You feel unusually sad or worried. °· You feel nauseous or you vomit. °· You pass large blood clots from your vagina. If you pass a blood clot from your vagina, save it to show to your health care provider. Do not flush blood clots down the toilet without having your health care provider look at them. °· You urinate more than usual. °· You are dizzy or light-headed. °· You have not breastfed at all and you have not had a menstrual period for 12 weeks after delivery. °· You have stopped breastfeeding and you have not had a menstrual period for 12 weeks after you stopped breastfeeding. °Get help right away if: °· You have: °? Pain that does not go away or does not get better with medicine. °? Chest pain. °? Difficulty breathing. °? Blurred vision or spots in your vision. °? Thoughts about hurting yourself or your baby. °· You develop pain in your abdomen or in one of your legs. °· You develop a severe headache. °· You faint. °· You bleed from your vagina so much that you fill two sanitary pads in one hour. °This information is not intended to replace advice given to you by your health care provider. Make sure you discuss any questions you have with your health care provider. °Document Released: 12/17/1999 Document Revised: 06/02/2015 Document Reviewed: 01/03/2015 °Elsevier Interactive Patient Education © 2019 Elsevier Inc. ° °

## 2018-03-06 ENCOUNTER — Encounter: Payer: Self-pay | Admitting: Obstetrics & Gynecology

## 2018-04-05 ENCOUNTER — Telehealth: Payer: Self-pay | Admitting: Medical

## 2018-04-05 NOTE — Telephone Encounter (Signed)
Was not able to leave a VM on phone number listed in Epic

## 2018-04-08 ENCOUNTER — Telehealth: Payer: Self-pay | Admitting: Family Medicine

## 2018-04-08 NOTE — Telephone Encounter (Signed)
Called the patient to inform of the video visit. Left the patient a detailed voicemail.

## 2018-04-08 NOTE — Telephone Encounter (Signed)
The patient called in to ensure her appointment was tomorrow. Informed the patient of the virtual visit and gave instructions on downloading webex. The patient is concerned of how her cervix will be checked via the app. Educated the patient she can discuss the concerns with the doctor tomorrow however I will notate the concerns in her chart.

## 2018-04-09 ENCOUNTER — Other Ambulatory Visit: Payer: Self-pay

## 2018-04-09 ENCOUNTER — Ambulatory Visit (INDEPENDENT_AMBULATORY_CARE_PROVIDER_SITE_OTHER): Payer: BLUE CROSS/BLUE SHIELD | Admitting: Medical

## 2018-04-09 DIAGNOSIS — Z1389 Encounter for screening for other disorder: Secondary | ICD-10-CM | POA: Diagnosis not present

## 2018-04-09 DIAGNOSIS — Z3009 Encounter for other general counseling and advice on contraception: Secondary | ICD-10-CM

## 2018-04-09 MED ORDER — NORETHINDRONE 0.35 MG PO TABS: 1.0000 | ORAL_TABLET | Freq: Every day | ORAL | 11 refills | Status: DC

## 2018-04-09 NOTE — BH Specialist Note (Deleted)
Integrated Behavioral Health Visit via Telemedicine (Telephone)  04/09/2018 Tamara Bryant [id number]   Session Start time: ***  Session End time: *** Total time: {IBH Total VQXI:50388828}  Referring Provider: *** Type of Visit: Telephonic Patient location: Home *** [facility] Provider location: [facility] *** All persons participating in visit: [facility] and Patient ***  Confirmed patient's address: {YES/NO:[address]} Confirmed patient's phone number: {YES/NO:[address]197} Any changes to demographics: {YES/NO:21197}  Confirmed patient's insurance: {YES/NO:21197} Any changes to patient's insurance: {YES/NO:21197}  Discussed confidentiality: {YES/NO:[address]197}   The following statements were read to the patient and/or legal guardian that are established with the [facility] Provider.  "The purpose of this phone visit is to provide behavioral health care while limiting exposure to the coronavirus (COVID19).  There is a possibility of technology failure and discussed alternative modes of communication if that failure occurs."  "By engaging in this telephone visit, you consent to the provision of healthcare.  Additionally, you authorize for your insurance to be billed for the services provided during this telephone visit."   Patient and/or legal guardian consented to telephone visit: {YES/NO:21197}  PRESENTING CONCERNS: Patient and/or family reports the following symptoms/concerns: *** Duration of problem: ***; Severity of problem: {Mild/Moderate/Severe:20260}  STRENGTHS (Protective Factors/Coping Skills): ***  GOALS ADDRESSED: Patient will: 1.  Reduce symptoms of: {IBH Symptoms:21014056}  2.  Increase knowledge and/or ability of: {IBH Patient Tools:21014057}  3.  Demonstrate ability to: {IBH Goals:21014053}  INTERVENTIONS: Interventions utilized:  {IBH Interventions:21014054} Standardized Assessments completed: {IBH Screening Tools:21014051}  ASSESSMENT: Patient currently experiencing ***.    Patient may benefit from ***.  PLAN: 1. Follow up with behavioral health clinician on : *** 2. Behavioral recommendations:  -*** -*** 3. Referral(s): {IBH Referrals:21014055}  Tamara Bryant  *** [place] 9 (positive SI) on 04/09/2018; no SI on 04/09/2018  Depression screen [facility] 2/9 02/27/2018 02/20/2018 02/13/2018 02/06/2018 01/23/2018  Decreased Interest 3 1 3 2 2   Down, Depressed, Hopeless 3 1 3 1 1   PHQ - 2 Score 6 2 6 3 3   Altered sleeping 3 2 1 3 2   Tired, decreased energy 3 3 2 3 3   Change in appetite 0 0 0 0 0  Feeling bad or failure about yourself  0 0 0 0 0  Trouble concentrating 0 0 0 0 0  Moving slowly or fidgety/restless 0 0 0 0 0  Suicidal thoughts 0 0 0 0 0  PHQ-9 Score 12 7 9 9 8   Some recent data might be hidden   GAD 7 : Generalized Anxiety Score 02/27/2018 02/20/2018 02/13/2018 02/06/2018  Nervous, Anxious, on Edge 3 3 3 2   Control/stop worrying 3 2 3 2   Worry too much - different things 3 2 3 2   Trouble relaxing 3 1 3 1   Restless 3 0 0 0  Easily annoyed or irritable 3 3 3  0  Afraid - awful might happen 3 0 3 2  Total GAD 7 Score 21 11 18  9

## 2018-04-09 NOTE — Progress Notes (Signed)
I connected with@ on 04/09/18 at  9:15 AM EDT by: WebEx and verified that I am speaking with the correct person using two identifiers.  Patient is located at home and provider is located at [facility].     The purpose of this virtual visit is to provide medical care while limiting exposure to the novel coronavirus. I discussed the limitations, risks, security and privacy concerns of performing an evaluation and management service by WebEx and the availability of in person appointments. I also discussed with the patient that there may be a patient responsible charge related to this service. By engaging in this virtual visit, you consent to the provision of healthcare.  Additionally, you authorize for your insurance to be billed for the services provided during this visit.  The patient expressed understanding and agreed to proceed.  Subjective:     Tamara Bryant is a 28 y.o. female who presents for a postpartum visit. She is 5 weeks postpartum following a spontaneous vaginal delivery. I have fully reviewed the prenatal and intrapartum course. The delivery was at 39.1 gestational weeks. Outcome: spontaneous vaginal delivery. Anesthesia: epidural. Postpartum course has been complicated by intermittent depression issues. Baby's course has been complicated by difficulty latching. Patient is pumping and supplementing with formula as well. Baby is feeding by both breast and bottle - gerber good start. Bleeding no bleeding. Bowel function is normal. Bladder function is normal. Patient is not sexually active. Contraception method is abstinence. Postpartum depression screening: positive. Had SI a few weeks ago, none recently.   The following portions of the patient's history were reviewed and updated as appropriate: allergies, current medications, past family history, past medical history, past social history, past surgical history and problem list.  Review of Systems Pertinent items are noted in HPI.   Objective:          Assessment:   Normal postpartum exam. Pap smear not done at today's visit. Normal pap during pregnancy PP depression   Plan:    1. Contraception: oral progesterone-only contraceptive 2. Referred to see IBH this week 3. Referred for lactation appointment next week 4.  Follow up in: 1 year for annual exam or sooner as needed.    Marny Lowenstein, PA-C 04/09/2018 10:46 AM

## 2018-04-11 ENCOUNTER — Ambulatory Visit: Payer: Self-pay | Admitting: Obstetrics and Gynecology

## 2018-04-15 ENCOUNTER — Ambulatory Visit: Payer: Self-pay

## 2018-04-16 ENCOUNTER — Encounter (HOSPITAL_COMMUNITY): Payer: Self-pay

## 2018-05-01 ENCOUNTER — Telehealth: Payer: Self-pay | Admitting: Family Medicine

## 2018-05-01 NOTE — Telephone Encounter (Signed)
Attempted to call patient to get her rescheduled for her missed appointments. No answer, and could not leave a message due to it not being setup

## 2018-05-01 NOTE — BH Specialist Note (Signed)
Integrated Behavioral Health Follow Up Visit  MRN: [id number] Name: Tamara Bryant  Number of Integrated Behavioral Health Clinician visits: 1/6 3 in Total Session Start time: 12:59  Session End time: 1:28 Total time: 30 minutes  Type of Service: Integrated Behavioral Health- Individual/Family Interpretor:No. Interpretor Name and Language: n/a  SUBJECTIVE: Tamara Bryant is a 28 y.o. female accompanied by n/a Patient was referred by Tamara BloomMyra Bryant for anxiety. Patient reports the following symptoms/concerns: Pt states her primary concern today is panic attacks postpartum that have tapered down from every day to several times per week postpartum. Pt says that Zoloft was not helpful, and she stopped taking; began seeing a therapist (she cannot recall the agency), but her insurance only covered 4 visits. Pt is open to learning a self-coping strategy to use for daily prevention, until she is able to re-establish with therapist and psychiatry once again. No SI, no HI.  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  Self-Care: Sleeping and eating well; keeping a positive outlook Life Changes: Recent childbirth  GOALS ADDRESSED: Patient will: 1.  Reduce symptoms of: anxiety and depression  2.  Increase knowledge and/or ability of: self-management skills  3.  Demonstrate ability to: Increase healthy adjustment to current life circumstances and Increase adequate support systems for patient/family  INTERVENTIONS: Interventions utilized:  Mindfulness or Management consultantelaxation Training and Link to [facility] [employer] Standardized Assessments completed: GAD-7  ASSESSMENT: Patient currently  experiencing Adjustment disorder with mixed anxious and depressed mood.   Patient may benefit from psychoeducation and brief therapeutic interventions regarding coping with symptoms of anxiety with panic attack and depression .  PLAN: 1. Follow up with behavioral health clinician on : One week for brief phone follow-up to find out if pt has re-established with previous therapist 2. Behavioral recommendations:  -Contact previous therapist to re-establish care -Contact insurance company to find out her behavioral health coverage limits -CALM relaxation breathing exercise as prevention, every morning; as needed throughout the day -Continue sleeping when baby sleeps at night -Consider attending at least one new mom support group (either [facility] or [email or web address]) in the upcoming week -Read educational materials regarding coping with symptoms of anxiety with panic attack   3. Referral(s): Integrated Art gallery managerBehavioral Health Services (In Clinic), Community Mental Health Services (LME/Outside Clinic) and Community Resources:  New mom support 4. "From scale of 1-10, how likely are you to follow plan?": 8  Type of Visit: Video [facility] Provider location: [facility] All persons participating in visit: Patient Tamara Bryant and [facility] Tamara Newbold, LCSW  Confirmed patient's address: Yes  Confirmed patient's phone number: Yes  Any changes to demographics: No   Confirmed patient's insurance: Yes  Any changes to patient's insurance: No   Discussed confidentiality: Yes    The following statements were read to the patient and/or legal guardian that are established with the [facility].  "The purpose of this video visit is to provide behavioral health care while limiting exposure to the coronavirus (COVID19).  There is a possibility of technology failure and discussed alternative modes of communication if that failure occurs."  "By engaging in this video visit,  you consent to the provision of healthcare.  Additionally,  you authorize for your insurance to be billed for the services provided during this video visit."   Patient and/or legal guardian consented to telephone visit: Yes `  STRENGTHS (Protective Factors/Coping Skills): Positive outlook, strong support from grandparents; willingness to engage in ongoing therapy  Tamara Lips, LCSW  Depression screen [facility] 2/9 05/02/2018 02/27/2018 02/20/2018 02/13/2018 02/06/2018  Decreased Interest 2 3 1 3 2   Down, Depressed, Hopeless 2 3 1 3 1   PHQ - 2 Score 4 6 2 6 3   Altered sleeping 2 3 2 1 3   Tired, decreased energy 2 3 3 2 3   Change in appetite 1 0 0 0 0  Feeling bad or failure about yourself  1 0 0 0 0  Trouble concentrating 1 0 0 0 0  Moving slowly or fidgety/restless 1 0 0 0 0  Suicidal thoughts 0 0 0 0 0  PHQ-9 Score 12 12 7 9 9   Some recent data might be hidden   GAD 7 : Generalized Anxiety Score 05/02/2018 02/27/2018 02/20/2018 02/13/2018  Nervous, Anxious, on Edge 3 3 3 3   Control/stop worrying 2 3 2 3   Worry too much - different things 2 3 2 3   Trouble relaxing 2 3 1 3   Restless 1 3 0 0  Easily annoyed or irritable 1 3 3 3   Afraid - awful might happen 1 3 0 3  Total GAD 7 Score 12 21 11  18

## 2018-05-02 ENCOUNTER — Ambulatory Visit (INDEPENDENT_AMBULATORY_CARE_PROVIDER_SITE_OTHER): Payer: BLUE CROSS/BLUE SHIELD | Admitting: Clinical

## 2018-05-02 ENCOUNTER — Other Ambulatory Visit: Payer: Self-pay

## 2018-05-02 DIAGNOSIS — F4323 Adjustment disorder with mixed anxiety and depressed mood: Secondary | ICD-10-CM | POA: Diagnosis not present

## 2018-05-02 NOTE — Addendum Note (Signed)
Addended by: Hulda Marin C on: 05/02/2018 03:27 PM   Modules accepted: Orders

## 2018-05-02 NOTE — Patient Instructions (Signed)
Coping with Panic Attacks   What is a panic attack?  You may have had a panic attack if you experienced four or more of the symptoms listed below coming on abruptly and peaking in about 10 minutes.  Panic Symptoms   . Pounding heart  . Sweating  . Trembling or shaking  . Shortness of breath  . Feeling of choking  . Chest pain  . Nausea or abdominal distress    . Feeling dizzy, unsteady, lightheaded, or faint  . Feelings of unreality or being detached from yourself  . Fear of losing control or going crazy  . Fear of dying  . Numbness or tingling  . Chills or hot flashes      Panic attacks are sometimes accompanied by avoidance of certain places or situations. These are often situations that would be difficult to escape from or in which help might not be available. Examples might include crowded shopping malls, public transportation, restaurants, or driving.   Why do panic attacks occur?   Panic attacks are the body's alarm system gone awry. All of us have a built-in alarm system, powered by adrenaline, which increases our heart rate, breathing, and blood flow in response to danger. Ordinarily, this 'danger response system' works well. In some people, however, the response is either out of proportion to whatever stress is going on, or may come out of the blue without any stress at all.   For example, if you are walking in the woods and see a bear coming your way, a variety of changes occur in your body to prepare you to either fight the danger or flee from the situation. Your heart rate will increase to get more blood flow around your body, your breathing rate will quicken so that more oxygen is available, and your muscles will tighten in order to be ready to fight or run. You may feel nauseated as blood flow leaves your stomach area and moves into your limbs. These bodily changes are all essential to helping you survive the dangerous situation. After the danger has passed, your body  functions will begin to go back to normal. This is because your body also has a system for "recovering" by bringing your body back down to a normal state when the danger is over.   As you can see, the emergency response system is adaptive when there is, in fact, a "true" or "real" danger (e.g., bear). However, sometimes people find that their emergency response system is triggered in "everyday" situations where there really is no true physical danger (e.g., in a meeting, in the grocery store, while driving in normal traffic, etc.).   What triggers a panic attack?  Sometimes particularly stressful situations can trigger a panic attack. For example, an argument with your spouse or stressors at work can cause a stress response (activating the emergency response system) because you perceive it as threatening or overwhelming, even if there is no direct risk to your survival.  Sometimes panic attacks don't seem to be triggered by anything in particular- they may "come out of the blue". Somehow, the natural "fight or flight" emergency response system has gotten activated when there is no real danger. Why does the body go into "emergency mode" when there is no real danger?   Often, people with panic attacks are frightened or alarmed by the physical sensations of the emergency response system. First, unexpected physical sensations are experienced (tightness in your chest or some shortness of breath). This then leads to   feeling fearful or alarmed by these symptoms ("Something's wrong!", "Am I having a heart attack?", "Am I going to faint?") The mind perceives that there is a danger even though no real danger exists. This, in turn, activates the emergency response system ("fight or flight"), leading to a "full blown" panic attack. In summary, panic attacks occur when we misinterpret physical symptoms as signs of impending death, craziness, loss of control, embarrassment, or fear of fear. Sometimes you may be aware of  thoughts of danger that activate the emergency response system (for example, thinking "I'm having a heart attack" when you feel chest pressure or increased heart rate). At other times, however, you may not be aware of such thoughts. After several incidences of being afraid of physical sensations, anxiety and panic can occur in response to the initial sensations without conscious thoughts of danger. Instead, you just feel afraid or alarmed. In other words, the panic or fear may seem to occur "automatically" without you consciously telling yourself anything.   After having had one or more panic attacks, you may also become more focused on what is going on inside your body. You may scan your body and be more vigilant about noticing any symptoms that might signal the start of a panic attack. This makes it easier for panic attacks to happen again because you pick up on sensations you might otherwise not have noticed, and misinterpret them as something dangerous. A panic attack may then result.      How do I cope with panic attacks?  An important part of overcoming panic attacks involves re-interpreting your body's physical reactions and teaching yourself ways to decrease the physical arousal. This can be done through practicing the cognitive and behavioral interventions below.   Research has found that over half of people who have panic attacks show some signs of hyperventilation or overbreathing. This can produce initial sensations that alarm you and lead to a panic attack. Overbreathing can also develop as part of the panic attack and make the symptoms worse. When people hyperventilate, certain blood vessels in the body become narrower. In particular, the brain may get slightly less oxygen. This can lead to the symptoms of dizziness, confusion, and lightheadedness that often occur during panic attacks. Other parts of the body may also get a bit less oxygen, which may lead to numbness or tingling in the hands  or feet or the sensation of cold, clammy hands. It also may lead the heart to pump harder. Although these symptoms may be frightening and feel unpleasant, it is important to remember that hyperventilating is not dangerous. However, you can help overcome the unpleasantness of overbreathing by practicing Breathing Retraining.   Practice this basic technique three times a day, every day:  . Inhale. With your shoulders relaxed, inhale as slowly and deeply as you can while you count to six. If you can, use your diaphragm to fill your lungs with air.  . Hold. Keep the air in your lungs as you slowly count to four.  . Exhale. Slowly breath out as you count to six.  . Repeat. Do the inhale-hold-exhale cycle several times. Each time you do it, exhale for longer counts.  Like any new skill, Breathing Retraining requires practice. Try practicing this skill twice a day for several minutes. Initially, do not try this technique in specific situations or when you become frightened or have a panic attack. Begin by practicing in a quiet environment to build up your skill level so that you   can later use it in time of "emergency."   2. Decreasing Avoidance  Regardless of whether you can identify why you began having panic attacks or whether they seemed to come out of the blue, the places where you began having panic attacks often can become triggers themselves. It is not uncommon for individuals to begin to avoid the places where they have had panic attacks. Over time, the individual may begin to avoid more and more places, thereby decreasing their activities and often negatively impacting their quality of life. To break the cycle of avoidance, it is important to first identify the places or situations that are being avoided, and then to do some "relearning."  To begin this intervention, first create a list of locations or situations that you tend to avoid. Then choose an avoided location or situation that you would like  to target first. Now develop an "exposure hierarchy" for this situation or location. An "exposure hierarchy" is a list of actions that make you feel anxious in this situation. Order these actions from least to most anxiety-producing. It is often helpful to have the first item on your hierarchy involve thinking or imagining part of the feared/avoided situation.   Here is an example of an exposure hierarchy for decreasing avoidance of the grocery store. Note how it is ordered from the least amount of anxiety (at the top) to the most anxiety (at the bottom):  . Think about going to the grocery store alone.  . Go to the grocery store with a friend or family member.  . Go to the grocery store alone to pick up a few small items (5-10 minutes in the store).  . Shopping for 10-20 minutes in the store alone.  . Doing the shopping for the week by myself (20-30 minutes in the store).   Your homework is to "expose" yourself to the lowest item on your hierarchy and use your breathing relaxation and coping statements (see below) to help you remain in the situation. Practice this several times during the upcoming week. Once you have mastered each item with minimal anxiety, move on to the next higher action on your list.   Cognitive Interventions  1. Identify your negative self-talk Anxious thoughts can increase anxiety symptoms and panic. The first step in changing anxious thinking is to identify your own negative, alarming self-talk. Some common alarming thoughts:  . I'm having a heart attack.            . I must be going crazy. . I think I'm dying. . People will think I'm crazy. . I'm going to pass our.  . Oh no- here it comes.  . I can't stand this.  . I've got to get out of here!  2. Use positive coping statements Changing or disrupting a pattern of anxious thoughts by replacing them with more calming or supportive statements can help to divert a panic attack. Some common helpful coping statements:  .  This is not an emergency.  . I don't like feeling this way, but I can accept it.  . I can feel like this and still be okay.  . This has happened before, and I was okay. I'll be okay this time, too.  . I can be anxious and still deal with this situation.        

## 2018-05-09 ENCOUNTER — Telehealth: Payer: Self-pay | Admitting: Clinical

## 2018-05-09 NOTE — Telephone Encounter (Signed)
Brief phone check, as agreed-upon by patient at last visit; pt did not contact her previous therapist in the past week, as she was focused on completing school final exams. Pt agrees she will call her therapist within the next two days, is aware that if she is unable to re-establish care with her previous therapist, she is able to contact [facility] at [facility] [phone] for additional help in re-establishing care with her previous therapist. Pt has no other questions or concerns at this time.   Less than 5 minute visit, 11:09am-11:13am

## 2018-07-09 ENCOUNTER — Other Ambulatory Visit: Payer: Self-pay | Admitting: Medical

## 2018-07-09 DIAGNOSIS — Z3009 Encounter for other general counseling and advice on contraception: Secondary | ICD-10-CM

## 2018-07-09 MED ORDER — NORGESTIMATE-ETH ESTRADIOL 0.25-35 MG-MCG PO TABS: 1.0000 | ORAL_TABLET | Freq: Every day | ORAL | 11 refills | Status: DC

## 2020-05-22 ENCOUNTER — Encounter (HOSPITAL_COMMUNITY): Payer: Self-pay | Admitting: Obstetrics and Gynecology

## 2020-05-22 ENCOUNTER — Other Ambulatory Visit: Payer: Self-pay

## 2020-05-22 ENCOUNTER — Inpatient Hospital Stay (HOSPITAL_COMMUNITY)
Admission: AD | Admit: 2020-05-22 | Discharge: 2020-05-22 | Disposition: A | Discharge status: Home / Self Care | Payer: Medicaid - Out of State | Attending: Obstetrics and Gynecology | Admitting: Obstetrics and Gynecology

## 2020-05-22 DIAGNOSIS — R112 Nausea with vomiting, unspecified: Secondary | ICD-10-CM | POA: Diagnosis not present

## 2020-05-22 DIAGNOSIS — Z3202 Encounter for pregnancy test, result negative: Secondary | ICD-10-CM | POA: Insufficient documentation

## 2020-05-22 LAB — HCG, QUANTITATIVE, PREGNANCY: hCG, Beta Chain, Quant, S: <1 m[IU]/mL (ref <5)

## 2020-05-22 LAB — POCT PREGNANCY, URINE: Preg Test, Ur: NEGATIVE

## 2020-05-22 NOTE — MAU Provider Note (Signed)
Event Date/Time   First Provider Initiated Contact with Patient 05/22/20 1629      S [name] is a 30 y.o. G1P1001 patient who presents to MAU today with complaint of not feeling well, N/V and can't keep anything down. Care Everywhere has Negative UPT results at [facility] on 05/18/2020.  O BP (!) 140/99 (BP Location: Right Arm)   Pulse 96   Temp 98.1 F (36.7 C) (Oral)   Resp 18   Ht 5\' 5"  (1.651 m)   Wt 112.5 kg   LMP 04/10/2020   SpO2 99%   BMI 41.29 kg/m  Physical Exam Vitals and nursing note reviewed.  Constitutional:      Appearance: Normal appearance. She is obese.  Musculoskeletal:        General: Normal range of motion.  Neurological:     Mental Status: She is alert and oriented to person, place, and time.  Psychiatric:        Mood and Affect: Mood normal.        Behavior: Behavior normal.        Thought Content: Thought content normal.        Judgment: Judgment normal.    A Medical screening exam complete Non-intractable vomiting with nausea, unspecified vomiting type - Plan: Discharge patient  Negative pregnancy test - HCG <1   P Discharge from MAU in stable condition Patient given the option of transfer to [facility] for further evaluation or seek care in outpatient facility of choice  Warning signs for worsening condition that would warrant emergency follow-up discussed Patient may return to MAU as needed for pregnancy Patient verbalized an understanding of the plan of care and agrees.   [facility], CNM 05/22/2020 4:29 PM

## 2020-05-22 NOTE — MAU Note (Signed)
Presents stating she doesn't feel well.  Reports N/V, states can't keep anything down.  Reports has taken 2 HPT, one resulted positive and the other with a negative result.  LMP 04/10/2020

## 2020-12-23 ENCOUNTER — Other Ambulatory Visit: Payer: Self-pay

## 2020-12-23 ENCOUNTER — Inpatient Hospital Stay (HOSPITAL_COMMUNITY)
Admission: AD | Admit: 2020-12-23 | Discharge: 2020-12-23 | Disposition: A | Discharge status: Home / Self Care | Payer: Medicaid - Out of State | Attending: Obstetrics and Gynecology | Admitting: Obstetrics and Gynecology

## 2020-12-23 ENCOUNTER — Encounter: Payer: Self-pay | Admitting: Certified Nurse Midwife

## 2020-12-23 ENCOUNTER — Encounter (HOSPITAL_COMMUNITY): Payer: Self-pay | Admitting: Obstetrics and Gynecology

## 2020-12-23 DIAGNOSIS — O99331 Smoking (tobacco) complicating pregnancy, first trimester: Secondary | ICD-10-CM | POA: Insufficient documentation

## 2020-12-23 DIAGNOSIS — O26891 Other specified pregnancy related conditions, first trimester: Secondary | ICD-10-CM | POA: Diagnosis present

## 2020-12-23 DIAGNOSIS — O98512 Other viral diseases complicating pregnancy, second trimester: Secondary | ICD-10-CM | POA: Diagnosis not present

## 2020-12-23 DIAGNOSIS — U071 COVID-19: Secondary | ICD-10-CM | POA: Insufficient documentation

## 2020-12-23 DIAGNOSIS — F17210 Nicotine dependence, cigarettes, uncomplicated: Secondary | ICD-10-CM | POA: Insufficient documentation

## 2020-12-23 DIAGNOSIS — Z3A13 13 weeks gestation of pregnancy: Secondary | ICD-10-CM | POA: Insufficient documentation

## 2020-12-23 DIAGNOSIS — R52 Pain, unspecified: Secondary | ICD-10-CM

## 2020-12-23 LAB — RESP PANEL BY RT-PCR (FLU A&B, COVID) ARPGX2
Influenza A by PCR: NEGATIVE
Influenza B by PCR: NEGATIVE
SARS Coronavirus 2 by RT PCR: POSITIVE — AB

## 2020-12-23 LAB — URINALYSIS, ROUTINE W REFLEX MICROSCOPIC
Bilirubin Urine: NEGATIVE
Glucose, UA: NEGATIVE mg/dL
Hgb urine dipstick: NEGATIVE
Ketones, ur: NEGATIVE mg/dL
Leukocytes,Ua: NEGATIVE
Nitrite: NEGATIVE
Protein, ur: 30 mg/dL — AB
Specific Gravity, Urine: 1.02 (ref 1.005–1.030)
pH: 9 — ABNORMAL HIGH (ref 5.0–8.0)

## 2020-12-23 LAB — CBC
HCT: 37.2 % (ref 36.0–46.0)
Hemoglobin: 13 g/dL (ref 12.0–15.0)
MCH: 28.8 pg (ref 26.0–34.0)
MCHC: 34.9 g/dL (ref 30.0–36.0)
MCV: 82.5 fL (ref 80.0–100.0)
Platelets: 257 10*3/uL (ref 150–400)
RBC: 4.51 MIL/uL (ref 3.87–5.11)
RDW: 14.2 % (ref 11.5–15.5)
WBC: 10.1 10*3/uL (ref 4.0–10.5)
nRBC: 0 % (ref 0.0–0.2)

## 2020-12-23 MED ORDER — ACETAMINOPHEN 500 MG PO TABS
1000.0000 mg | ORAL_TABLET | Freq: Four times a day (QID) | ORAL | Status: DC | PRN
  Administered 2020-12-23: 18:00:00 1000 mg via ORAL
  Filled 2020-12-23: qty 2

## 2020-12-23 NOTE — Discharge Instructions (Signed)

## 2020-12-23 NOTE — MAU Provider Note (Signed)
History     CSN: [id number]  Arrival date and time: 12/23/20 1554   Event Date/Time   First Provider Initiated Contact with Patient 12/23/20 1700      Chief Complaint  Patient presents with   Generalized Body Aches   30 y.o. G2P1001 @13 .5 wks presenting with body aches, chills, and feeling lightheaded. Sx started today. Denies cough, sore throat, or congestion. Denies sick contacts. Admit to low water intake, mostly drinks OJ. Denies abdominal pain or VB. Denies urinary sx. Also reports seeing a "ball" in her upper abdomen at times. States "I know its not the baby". Reports getting PNC in [place], here visiting family for the holiday.   OB History     Gravida  2   Para  1   Term  1   Preterm      AB      Living  1      SAB      IAB      Ectopic      Multiple  0   Live Births  1           Past Medical History:  Diagnosis Date   Anemia    Asthma    History of tobacco use    Marijuana use    Obesity    Seasonal allergies     Past Surgical History:  Procedure Laterality Date   WISDOM TOOTH EXTRACTION      Family History  Problem Relation Age of Onset   Hypertension Mother    Sickle cell anemia Mother    Diabetes Father    Hypertension Father    Diabetes Maternal Grandmother    Diabetes Paternal Grandfather     Social History   Tobacco Use   Smoking status: Every Day    Packs/day: 0.25    Types: Cigarettes   Smokeless tobacco: Former    Quit date: 07/09/2017   Tobacco comments:    07/06/2017  Vaping Use   Vaping Use: Former  Substance Use Topics   Alcohol use: Not Currently    Comment: last 07/05   Drug use: Yes    Types: Marijuana    Comment: last use 12/22/2020    Allergies: No Known Allergies  No medications prior to admission.    Review of Systems  Constitutional:  Positive for chills.  HENT:  Negative for congestion and sore throat.   Respiratory:  Negative for cough and shortness of breath.   Gastrointestinal:   Positive for abdominal pain.  Genitourinary:  Negative for vaginal bleeding.  Musculoskeletal:  Positive for myalgias.  Neurological:  Positive for light-headedness.  Physical Exam   Blood pressure 108/65, pulse (!) 107, temperature 100 F (37.8 C), temperature source Oral, resp. rate 18, height 5\' 5"  (1.651 m), weight 101.8 kg, last menstrual period 04/10/2020, SpO2 99 %, unknown if currently breastfeeding.  Physical Exam Vitals and nursing note reviewed.  Constitutional:      General: She is not in acute distress.    Appearance: Normal appearance.  HENT:     Head: Normocephalic and atraumatic.  Cardiovascular:     Rate and Rhythm: Regular rhythm. Tachycardia present.     Heart sounds: Normal heart sounds.  Pulmonary:     Effort: Pulmonary effort is normal. No respiratory distress.     Breath sounds: Normal breath sounds. No stridor. No wheezing, rhonchi or rales.  Abdominal:     General: There is no distension.     Palpations: Abdomen is  soft. There is no mass.     Tenderness: There is no abdominal tenderness. There is no guarding or rebound.     Hernia: A hernia (3cm ventral diastasis) is present.  Musculoskeletal:        General: Normal range of motion.     Cervical back: Normal range of motion.  Skin:    General: Skin is warm and dry.  Neurological:     General: No focal deficit present.     Mental Status: She is alert and oriented to person, place, and time.  Psychiatric:        Mood and Affect: Mood normal.        Behavior: Behavior normal.  FHT 172  Results for orders placed or performed during the hospital encounter of 12/23/20 (from the past 24 hour(s))  Urinalysis, Routine w reflex microscopic Urine, Clean Catch     Status: Abnormal   Collection Time: 12/23/20  4:16 PM  Result Value Ref Range   Color, Urine YELLOW YELLOW   APPearance CLOUDY (A) CLEAR   Specific Gravity, Urine 1.020 1.005 - 1.030   pH 9.0 (H) 5.0 - 8.0   Glucose, UA NEGATIVE NEGATIVE mg/dL    Hgb urine dipstick NEGATIVE NEGATIVE   Bilirubin Urine NEGATIVE NEGATIVE   Ketones, ur NEGATIVE NEGATIVE mg/dL   Protein, ur 30 (A) NEGATIVE mg/dL   Nitrite NEGATIVE NEGATIVE   Leukocytes,Ua NEGATIVE NEGATIVE   RBC / HPF 0-5 0 - 5 RBC/hpf   WBC, UA 6-10 0 - 5 WBC/hpf   Bacteria, UA RARE (A) NONE SEEN   Squamous Epithelial / LPF 0-5 0 - 5   Mucus PRESENT    Amorphous Crystal PRESENT   Resp Panel by RT-PCR (Flu A&B, Covid) Nasopharyngeal Swab     Status: Abnormal   Collection Time: 12/23/20  5:16 PM   Specimen: Nasopharyngeal Swab; Nasopharyngeal(NP) swabs in vial transport medium  Result Value Ref Range   SARS Coronavirus 2 by RT PCR POSITIVE (A) NEGATIVE   Influenza A by PCR NEGATIVE NEGATIVE   Influenza B by PCR NEGATIVE NEGATIVE  CBC     Status: None   Collection Time: 12/23/20  5:26 PM  Result Value Ref Range   WBC 10.1 4.0 - 10.5 K/uL   RBC 4.51 3.87 - 5.11 MIL/uL   Hemoglobin 13.0 12.0 - 15.0 g/dL   HCT 45.8 09.9 - 83.3 %   MCV 82.5 80.0 - 100.0 fL   MCH 28.8 26.0 - 34.0 pg   MCHC 34.9 30.0 - 36.0 g/dL   RDW 82.5 05.3 - 97.6 %   Platelets 257 150 - 400 K/uL   nRBC 0.0 0.0 - 0.2 %    MAU Course  Procedures Tylenol  MDM Labs ordered and reviewed. Stable for discharge. Pt notified via MyChart of results.   Assessment and Plan   1. [redacted] weeks gestation of pregnancy   2. Generalized body aches   3. COVID-19 affecting pregnancy in second trimester    Discharge home Follow up with primary OB provider as scheduled OTC med list provided Return precautions  Allergies as of 12/23/2020   No Known Allergies      Medication List     STOP taking these medications    ibuprofen 600 MG tablet Commonly known as: ADVIL   norgestimate-ethinyl estradiol 0.25-35 MG-MCG tablet Commonly known as: ORTHO-CYCLEN       TAKE these medications    albuterol 108 (90 Base) MCG/ACT inhaler Commonly known as: VENTOLIN HFA Inhale  2 puffs into the lungs every 6 (six) hours as  needed for wheezing or shortness of breath.   sertraline 50 MG tablet Commonly known as: Zoloft Take 1 tablet (50 mg total) by mouth daily.        Donette Larry, CNM 12/23/2020, 7:39 PM

## 2020-12-23 NOTE — MAU Note (Signed)
Tamara Bryant is a 30 y.o. at [redacted]w[redacted]d here in MAU reporting: states she is having body aches and is cold. Feels like she has a fever, has not checked temp at home. Occ has abdominal pain depending on how she is laying but none at this time. Also feeling lightheaded.   Onset of complaint: today  Pain score: 8/10  Vitals:   12/23/20 1614  BP: 117/75  Pulse: (!) 115  Resp: 18  Temp: 100 F (37.8 C)  SpO2: 99%     FHT:172  Lab orders placed from triage: UA

## 2021-04-21 ENCOUNTER — Encounter (HOSPITAL_COMMUNITY): Payer: Self-pay | Admitting: Obstetrics and Gynecology

## 2021-04-21 ENCOUNTER — Inpatient Hospital Stay (HOSPITAL_COMMUNITY)
Admission: AD | Admit: 2021-04-21 | Discharge: 2021-04-21 | Disposition: A | Discharge status: Home / Self Care | Payer: Medicaid Other | Attending: Obstetrics and Gynecology | Admitting: Obstetrics and Gynecology

## 2021-04-21 DIAGNOSIS — O26893 Other specified pregnancy related conditions, third trimester: Secondary | ICD-10-CM | POA: Diagnosis present

## 2021-04-21 DIAGNOSIS — R519 Headache, unspecified: Secondary | ICD-10-CM | POA: Diagnosis not present

## 2021-04-21 DIAGNOSIS — L299 Pruritus, unspecified: Secondary | ICD-10-CM | POA: Diagnosis not present

## 2021-04-21 DIAGNOSIS — J301 Allergic rhinitis due to pollen: Secondary | ICD-10-CM

## 2021-04-21 DIAGNOSIS — Z3A30 30 weeks gestation of pregnancy: Secondary | ICD-10-CM | POA: Diagnosis not present

## 2021-04-21 DIAGNOSIS — O99513 Diseases of the respiratory system complicating pregnancy, third trimester: Secondary | ICD-10-CM | POA: Insufficient documentation

## 2021-04-21 DIAGNOSIS — J45909 Unspecified asthma, uncomplicated: Secondary | ICD-10-CM | POA: Insufficient documentation

## 2021-04-21 DIAGNOSIS — R0602 Shortness of breath: Secondary | ICD-10-CM | POA: Diagnosis not present

## 2021-04-21 DIAGNOSIS — Z113 Encounter for screening for infections with a predominantly sexual mode of transmission: Secondary | ICD-10-CM | POA: Diagnosis not present

## 2021-04-21 LAB — WET PREP, GENITAL
Sperm: NONE SEEN
Trich, Wet Prep: NONE SEEN
WBC, Wet Prep HPF POC: >=10 — AB (ref <10)
Yeast Wet Prep HPF POC: NONE SEEN

## 2021-04-21 MED ORDER — FLUTICASONE PROPIONATE 50 MCG/ACT NA SUSP: 1.0000 | Freq: Every day | NASAL | 0 refills | Status: DC

## 2021-04-21 MED ORDER — LORATADINE 10 MG PO TABS: 10.0000 mg | ORAL_TABLET | Freq: Every day | ORAL | 1 refills | Status: DC

## 2021-04-21 MED ORDER — FAMOTIDINE 20 MG PO TABS
40.0000 mg | ORAL_TABLET | Freq: Once | ORAL | Status: AC
  Administered 2021-04-21: 40 mg via ORAL
  Filled 2021-04-21: qty 2

## 2021-04-21 MED ORDER — ACETAMINOPHEN 500 MG PO TABS
1000.0000 mg | ORAL_TABLET | Freq: Once | ORAL | Status: AC
  Administered 2021-04-21: 1000 mg via ORAL
  Filled 2021-04-21: qty 2

## 2021-04-21 MED ORDER — FLUTICASONE PROPIONATE 50 MCG/ACT NA SUSP
1.0000 | Freq: Once | NASAL | Status: AC
  Administered 2021-04-21: 1 via NASAL
  Filled 2021-04-21: qty 16

## 2021-04-21 MED ORDER — LORATADINE 10 MG PO TABS
10.0000 mg | ORAL_TABLET | Freq: Once | ORAL | Status: AC
  Administered 2021-04-21: 10 mg via ORAL
  Filled 2021-04-21: qty 1

## 2021-04-21 NOTE — MAU Note (Addendum)
...  Tamara Bryant is a 31 y.o. at [redacted]w[redacted]d here in MAU reporting: Ears itching around 1230 today after waking up. Then went to eat at [place] and she states she started to experiencing itching of her eyes, throat, face, and body. She states it is hard to swallow and breathe. Endorses a HA since 0930. Patient's nose running continually in triage. No VB or LOF. +FM.  ? ?Last took Benadryl at 1230. States it has not relieved any of her symptoms. ?Ibuprofen 600 mg at 1030. ?Inhaler used 1230. ? ?Pain score:  ?5/10 HA ? ?FHT: 147 doppler ? ? ?

## 2021-04-21 NOTE — MAU Provider Note (Addendum)
?History  ?  ? ?CSN: [id number] ? ?Arrival date and time: 04/21/21 1422 ? ? Event Date/Time  ? First Provider Initiated Contact with Patient 04/21/21 1455   ?  ?"Itching" ? ?Patient is a 31yo G2P1 at [redacted]w[redacted]d.  Who presented to the female with chief complaint of itching all over.  Patient reports that she woke up with a mild headache and then started to have bilateral ear itching.  This seemed to improve mildly with some ibuprofen.  Patient noticed that on her way to the [facility] for breakfast (did not eat anything today prior to symptoms starting) her itching worsened and spread to her eye and then face.  She also endorsed some throat tightening/scratchiness, equates it to something similar to strep throat.  Patient has had some mild shortness of breath throughout this pregnancy but feels that it was worsened today. Additionally has had runny nose and watery eyes with sneezing. She took Benadryl at about 1230 and did not feel like this helped her symptoms at all.  Denies any rashes, N/V, or abdominal pain.  Patient is unaware of any allergies.  She has seasonal allergies and will take Claritin occasionally.  She also has history of asthma requiring as needed albuterol inhaler.  She did utilize this today.  Patient denies any new lotions, soaps, make-up, medications.  She receives her prenatal care in [place] but will be delivering here at [facility] as this is where her first baby was born.  Patient denies any vaginal bleeding, loss of fluid.  She endorses fetal movement and an occasional contraction.  Patient mentions runny nose and a cough.  Denies any sick contacts.   ? ?In terms of pregnancy, reports she has been well. Endorses + fetal movement. Denies any LOF, vaginal bleeding, or contractions.  ? ? ?OB History   ? ? Gravida  ?2  ? Para  ?1  ? Term  ?1  ? Preterm  ?   ? AB  ?   ? Living  ?1  ?  ? ? SAB  ?   ? IAB  ?   ? Ectopic  ?   ? Multiple  ?0  ? Live Births  ?1  ?   ?  ?  ? ? ?Past Medical History:   ?Diagnosis Date  ? Anemia   ? Asthma   ? History of tobacco use   ? Marijuana use   ? Obesity   ? Seasonal allergies   ? ? ?Past Surgical History:  ?Procedure Laterality Date  ? WISDOM TOOTH EXTRACTION    ? ? ?Family History  ?Problem Relation Age of Onset  ? Hypertension Mother   ? Sickle cell anemia Mother   ? Diabetes Father   ? Hypertension Father   ? Diabetes Maternal Grandmother   ? Diabetes Paternal Grandfather   ? ? ?Social History  ? ?Tobacco Use  ? Smoking status: Every Day  ?  Packs/day: 0.25  ?  Types: Cigarettes  ? Smokeless tobacco: Former  ?  Quit date: 07/09/2017  ? Tobacco comments:  ?  07/06/2017  ?Vaping Use  ? Vaping Use: Former  ?Substance Use Topics  ? Alcohol use: Not Currently  ?  Comment: last 07/05  ? Drug use: Yes  ?  Types: Marijuana  ?  Comment: last use 12/22/2020  ? ? ?Allergies: No Known Allergies ? ?No medications prior to admission.  ? ? ?Review of Systems  ?Constitutional:  Negative for fever.  ?HENT:  Positive  for rhinorrhea, sneezing and voice change. Negative for trouble swallowing.   ?Eyes:  Positive for itching. Negative for photophobia and redness.  ?Respiratory:  Positive for cough. Negative for apnea.   ?Gastrointestinal:  Negative for abdominal pain, constipation, diarrhea, nausea and vomiting.  ?Genitourinary:  Negative for dysuria.  ?Allergic/Immunologic: Positive for environmental allergies. Negative for food allergies.  ?Neurological:  Positive for headaches.  ?Psychiatric/Behavioral:  Negative for behavioral problems.   ?Physical Exam  ? ?Blood pressure 110/66, pulse 91, temperature 99.4 ?F (37.4 ?C), temperature source Oral, resp. rate 15, last menstrual period 04/10/2020, SpO2 100 %, unknown if currently breastfeeding. ? ?Physical Exam ?Constitutional:   ?   General: She is not in acute distress. ?   Appearance: Normal appearance. She is not ill-appearing or diaphoretic.  ?HENT:  ?   Head: Normocephalic and atraumatic.  ?   Right Ear: External ear normal.  ?   Left  Ear: External ear normal.  ?   Nose: Rhinorrhea present.  ?   Mouth/Throat:  ?   Mouth: Mucous membranes are moist.  ?   Pharynx: Oropharynx is clear. No posterior oropharyngeal erythema.  ?Eyes:  ?   General:     ?   Right eye: No discharge.     ?   Left eye: No discharge.  ?   Extraocular Movements: Extraocular movements intact.  ?   Conjunctiva/sclera: Conjunctivae normal.  ?Cardiovascular:  ?   Rate and Rhythm: Normal rate.  ?Pulmonary:  ?   Effort: Pulmonary effort is normal.  ?   Breath sounds: Normal breath sounds. No wheezing.  ?Abdominal:  ?   Palpations: Abdomen is soft.  ?   Comments: Gravid appropriate for gestational age   ?Musculoskeletal:  ?   Cervical back: Normal range of motion. No tenderness.  ?Lymphadenopathy:  ?   Cervical: No cervical adenopathy.  ?Skin: ?   General: Skin is warm and dry.  ?   Capillary Refill: Capillary refill takes less than 2 seconds.  ?   Findings: No erythema or rash.  ?Neurological:  ?   General: No focal deficit present.  ?   Mental Status: She is alert.  ?Psychiatric:     ?   Mood and Affect: Mood normal.  ? ? ?MAU Course  ? ? ?Vital signs stable.  Patient given Tylenol, Pepcid, Flonase, Clartin with complete response and resolution of all symptoms ? ?Assessment and Plan  ?#Pruritis:  ?Well appearing on exam without rash, persistent tachycardia, or vital instability. Likely 2/2 to seasonal allergies and pollen season especially with watery itchy eyes/nose/throat in new environment (usually in [place], down in [place] today).  Patient has a history of bilateral chronic eczematoid ottitis externa and was seen by an ENT in February and prescribed a steroid cream that the patient never used. Considered allergic reaction without known etiology, but less likely with such drastic improvement shortly after oral anti-histamines only. Clinical presentation not consistent with cholestasis of pregnancy.  ?-- Trial Pepcid, Flonase, Claritin with complete resolution of symptoms ?-- VSS ?--  Instructed to continue daily Claritin and flonase (rx'd to pharmacy)  ? ?#Headache ?Improved mildly with Ibuprofen at home.  Blood pressures normal. Instructed no further ibuprofen. Given tylenol 1000mg  with resolution.  ? ?#STD screening: ?Pt request. No symptoms or exposure. Gc/ch/wet prep self collected.  ? ?Dispo: Recommended patient stay for an additional hour to ensure no rebound of symptoms and stable vitals, but patient reported her significant other needed to be home by 6  pm for legal curfew. She is stable, discharged home with strict return precautions especially if symptoms return/worsen. ? ?Moshe CiproJessica Simmons-Josilevich, DO, PGY-1 ?04/21/2021, 4:21 PM  ? ?GME ATTESTATION:  ?I saw and evaluated the patient. I agree with the findings and the plan of care as documented in the resident?s note. My edits were added above.  ? ?Leticia PennaSamantha Sheza Strickland, DO ?OB Fellow, Faculty Practice ?[place] ?04/21/2021 4:23 PM  ?

## 2021-04-21 NOTE — Discharge Instructions (Signed)
PLEASE RETURN if your symptoms recur and/or if you are having difficulty breathing, throat or tongue swelling, decreased fetal movement, vaginal bleeding, leakage of fluid, or persistent painful contractions.  ?

## 2021-04-22 LAB — GC/CHLAMYDIA PROBE AMP (~~LOC~~) NOT AT ARMC
Chlamydia: NEGATIVE
Comment: NEGATIVE
Comment: NORMAL
Neisseria Gonorrhea: NEGATIVE

## 2021-05-18 ENCOUNTER — Other Ambulatory Visit: Payer: Self-pay | Admitting: Family Medicine

## 2021-05-30 ENCOUNTER — Inpatient Hospital Stay (HOSPITAL_COMMUNITY)
Admission: AD | Admit: 2021-05-30 | Discharge: 2021-05-30 | Disposition: A | Discharge status: Home / Self Care | Payer: Medicaid Other | Attending: Obstetrics and Gynecology | Admitting: Obstetrics and Gynecology

## 2021-05-30 ENCOUNTER — Other Ambulatory Visit: Payer: Self-pay

## 2021-05-30 ENCOUNTER — Encounter (HOSPITAL_COMMUNITY): Payer: Self-pay | Admitting: Obstetrics and Gynecology

## 2021-05-30 DIAGNOSIS — O4703 False labor before 37 completed weeks of gestation, third trimester: Secondary | ICD-10-CM | POA: Diagnosis not present

## 2021-05-30 DIAGNOSIS — O26853 Spotting complicating pregnancy, third trimester: Secondary | ICD-10-CM | POA: Insufficient documentation

## 2021-05-30 DIAGNOSIS — Z3A36 36 weeks gestation of pregnancy: Secondary | ICD-10-CM | POA: Diagnosis not present

## 2021-05-30 DIAGNOSIS — R109 Unspecified abdominal pain: Secondary | ICD-10-CM | POA: Diagnosis not present

## 2021-05-30 DIAGNOSIS — M549 Dorsalgia, unspecified: Secondary | ICD-10-CM | POA: Diagnosis not present

## 2021-05-30 DIAGNOSIS — O26893 Other specified pregnancy related conditions, third trimester: Secondary | ICD-10-CM | POA: Insufficient documentation

## 2021-05-30 DIAGNOSIS — O479 False labor, unspecified: Secondary | ICD-10-CM

## 2021-05-30 DIAGNOSIS — Z3689 Encounter for other specified antenatal screening: Secondary | ICD-10-CM | POA: Diagnosis not present

## 2021-05-30 LAB — URINALYSIS, ROUTINE W REFLEX MICROSCOPIC
Bacteria, UA: NONE SEEN
Bilirubin Urine: NEGATIVE
Glucose, UA: NEGATIVE mg/dL
Ketones, ur: NEGATIVE mg/dL
Nitrite: NEGATIVE
Protein, ur: 30 mg/dL — AB
Specific Gravity, Urine: 1.023 (ref 1.005–1.030)
pH: 6 (ref 5.0–8.0)

## 2021-05-30 NOTE — MAU Note (Signed)
.  Tamara Bryant is a 31 y.o. at [redacted]w[redacted]d here in MAU reporting: she had back pain and stomach and nausea this morning. [facility] and was given a shot to stop cramping. Stated they checked her and she was 1cm. They sent her home. SHe went home and took a nap and got up to go to the BR and had some blood when she wiped. Wiped a few more times and still having some blood. Not bleeding like a period does not ned to wear a pad. reports good fetal movement s c/o some ctx and back pain still. Not ctx very often 2-3 times an hour.   Onset of complaint: 5pm Pain score: 7/10 Vitals:   05/30/21 1852  BP: (!) 95/52  Pulse: 94  Resp: 18  Temp: 98.1 F (36.7 C)     FHT:150 Lab orders placed from triage:

## 2021-05-30 NOTE — MAU Provider Note (Cosign Needed Addendum)
History     CSN: [id number]  Arrival date and time: 05/30/21 1821   Event Date/Time   First Provider Initiated Contact with Patient 05/30/21 1909      Chief Complaint  Patient presents with   Vaginal Bleeding   Abdominal Pain   31 y.o. G2P1001 @36 .2 wks presenting with spotting, ctx, and back pain. Reports onset of spotting this afternoon after waking from nap. States its pinkish-red when she wipes. Ctx are 1-2 in an hour. Denies LOF. Reports good FM. Of note she was seen in [place] this morning and had her cervix checked twice.   OB History     Gravida  2   Para  1   Term  1   Preterm      AB      Living  1      SAB      IAB      Ectopic      Multiple  0   Live Births  1           Past Medical History:  Diagnosis Date   Anemia    Asthma    History of tobacco use    Marijuana use    Obesity    Seasonal allergies     Past Surgical History:  Procedure Laterality Date   WISDOM TOOTH EXTRACTION      Family History  Problem Relation Age of Onset   Hypertension Mother    Sickle cell anemia Mother    Diabetes Father    Hypertension Father    Diabetes Maternal Grandmother    Diabetes Paternal Grandfather     Social History   Tobacco Use   Smoking status: Every Day    Packs/day: 0.25    Types: Cigarettes   Smokeless tobacco: Former    Quit date: 07/09/2017   Tobacco comments:    07/06/2017  Vaping Use   Vaping Use: Former  Substance Use Topics   Alcohol use: Not Currently    Comment: last 07/05   Drug use: Yes    Types: Marijuana    Comment: today 05/30/21    Allergies: No Known Allergies  No medications prior to admission.    Review of Systems  Gastrointestinal:  Positive for abdominal pain (ctx).  Genitourinary:  Positive for vaginal bleeding. Negative for vaginal discharge.  Physical Exam   Blood pressure 112/67, pulse 92, temperature 98.1 F (36.7 C), resp. rate 18, height 5\' 5"  (1.651 m), weight 98 kg, last menstrual  period 04/10/2020, SpO2 99 %, unknown if currently breastfeeding.  Physical Exam Vitals and nursing note reviewed. Exam conducted with a chaperone present.  Constitutional:      General: She is not in acute distress.    Appearance: Normal appearance.  HENT:     Head: Normocephalic and atraumatic.  Cardiovascular:     Rate and Rhythm: Normal rate.  Pulmonary:     Effort: Pulmonary effort is normal. No respiratory distress.  Genitourinary:    Comments: External: no lesions or erythema Vagina: rugated, pink, moist, no blood or discharge Cervix 1/thick/vtx   Musculoskeletal:        General: Normal range of motion.     Cervical back: Normal range of motion.  Skin:    General: Skin is warm and dry.  Neurological:     General: No focal deficit present.     Mental Status: She is alert and oriented to person, place, and time.  Psychiatric:  Mood and Affect: Mood normal.        Behavior: Behavior normal.  EFM: 140 bpm, mod variability, + accels, no decels Toco: none  Results for orders placed or performed during the hospital encounter of 05/30/21 (from the past 24 hour(s))  Urinalysis, Routine w reflex microscopic Urine, Clean Catch     Status: Abnormal   Collection Time: 05/30/21  6:59 PM  Result Value Ref Range   Color, Urine AMBER (A) YELLOW   APPearance HAZY (A) CLEAR   Specific Gravity, Urine 1.023 1.005 - 1.030   pH 6.0 5.0 - 8.0   Glucose, UA NEGATIVE NEGATIVE mg/dL   Hgb urine dipstick MODERATE (A) NEGATIVE   Bilirubin Urine NEGATIVE NEGATIVE   Ketones, ur NEGATIVE NEGATIVE mg/dL   Protein, ur 30 (A) NEGATIVE mg/dL   Nitrite NEGATIVE NEGATIVE   Leukocytes,Ua SMALL (A) NEGATIVE   RBC / HPF 6-10 0 - 5 RBC/hpf   WBC, UA 6-10 0 - 5 WBC/hpf   Bacteria, UA NONE SEEN NONE SEEN   Squamous Epithelial / LPF 6-10 0 - 5   Mucus PRESENT    MAU Course  Procedures  MDM Prenatal records limited for viewing in [place]. Pregnancy complicated by obesity and GDM. Labs  ordered and reviewed. No signs of PTL. Pt is getting care in [place] but plans to deliver at [facility], I recommend she bring a copy of her records to bring with her. She is stable for discharge home.   Assessment and Plan   1. [redacted] weeks gestation of pregnancy   2. NST (non-stress test) reactive   3. Braxton Hicks contractions    Discharge home Follow up at [facility] PTL precautions  Allergies as of 05/30/2021   No Known Allergies      Medication List     TAKE these medications    albuterol 108 (90 Base) MCG/ACT inhaler Commonly known as: VENTOLIN HFA Inhale 2 puffs into the lungs every 6 (six) hours as needed for wheezing or shortness of breath.   fluticasone 50 MCG/ACT nasal spray Commonly known as: FLONASE Place 1 spray into both nostrils daily.   loratadine 10 MG tablet Commonly known as: Claritin Take 1 tablet (10 mg total) by mouth daily.   metFORMIN 500 MG tablet Commonly known as: GLUCOPHAGE Take by mouth 2 (two) times daily with a meal.   ondansetron 4 MG tablet Commonly known as: ZOFRAN Take 4 mg by mouth every 8 (eight) hours as needed for nausea or vomiting.   prenatal multivitamin Tabs tablet Take 1 tablet by mouth daily at 12 noon.   sertraline 50 MG tablet Commonly known as: Zoloft Take 1 tablet (50 mg total) by mouth daily.       Julianne Handler, CNM 05/30/2021, 7:59 PM

## 2022-01-02 NOTE — L&D Delivery Note (Addendum)
OB/GYN Faculty Practice Delivery Note  Tamara Bryant is a 32 y.o. [id number] s/p SVD at [redacted]w[redacted]d. She was admitted for A2GDM IOL.   ROM: 5h 61m with clear fluid GBS Status:  Negative/-- (12/10 0326) Maximum Maternal Temperature: 98.65F  Labor Progress: Initial SVE: 1/50/-3. She then progressed to complete.   Delivery Date/Time: 2109 12/21/22 Delivery: Called to room and patient was complete and pushing. Head delivered ROA. No nuchal cord present. Shoulder and body delivered in usual fashion. Infant with spontaneous cry, placed on mother's abdomen, dried and stimulated. Cord clamped x 2 after 1-minute delay, and cut by FOB. Cord blood drawn. Placenta delivered spontaneously with gentle cord traction. Fundus firm with massage and Pitocin. Labia, perineum, vagina, and cervix inspected with R labial laceration requiring suture repair in usual running fashion.   POST-PLACENTAL IUD PROCEDURE NOTE  Tamara Bryant is a 32 y.o. [id number] here for IOL and now s/p SVD desiring postplacental IUD insertion. No GYN concerns.  Last pap smear was on 08/02/22 and was normal.  IUD (Mirena) Insertion Procedure Note Patient identified, informed consent performed, consent signed.   Discussed risks of irregular bleeding, cramping, infection, malpositioning or misplacement of the IUD outside the uterus which may require further procedure such as laparoscopy. Also discussed >99% contraception efficacy, increased risk of ectopic pregnancy with failure of method.   Emphasized that this did not protect against STIs, condoms recommended during all sexual encounters. Time out was performed.    Sterile Cervical exam preformed and found the internal os to be finger tip to 2 cm dilated.  Called for assistance of attending Dr. Vergie Living for placement of IUD; device still in applicator. Using real-time ultrasound guidance, the Mirena IUD was placed at the mid fundal position and deployed.   Mirena IUD placed per manufacturer's  recommendations.  Strings trimmed to just before the introitus and tucked into the vagina. Good hemostasis noted.  Patient tolerated procedure well.   Patient was given post-procedure instructions.  She was advised to have backup contraception for one week.  Patient was told that strings may come out and to call the clinic if this happens  Baby Weight: 3250gm  Placenta: 3 vessel, intact. Sent to L&D Complications: None Lacerations: as above EBL: 187 mL Analgesia: Epidural   Infant:  APGAR (1 MIN): 9  APGAR (5 MINS): 9   [name], MD [facility] Family Medicine Fellow, [facility] for [facility], [facility] 12/21/2022, 9:48 PM  Agree with above. I was present for the entire procedure.   Cornelia Copa MD Attending Center for [employer] [facility])

## 2022-05-07 ENCOUNTER — Encounter (HOSPITAL_COMMUNITY): Payer: Self-pay | Admitting: *Deleted

## 2022-05-07 ENCOUNTER — Inpatient Hospital Stay (HOSPITAL_COMMUNITY): Payer: Medicaid Other

## 2022-05-07 ENCOUNTER — Inpatient Hospital Stay (HOSPITAL_COMMUNITY)
Admission: AD | Admit: 2022-05-07 | Discharge: 2022-05-07 | Disposition: A | Discharge status: Home / Self Care | Payer: Medicaid Other | Attending: Obstetrics and Gynecology | Admitting: Obstetrics and Gynecology

## 2022-05-07 DIAGNOSIS — O26891 Other specified pregnancy related conditions, first trimester: Secondary | ICD-10-CM | POA: Insufficient documentation

## 2022-05-07 DIAGNOSIS — O98811 Other maternal infectious and parasitic diseases complicating pregnancy, first trimester: Secondary | ICD-10-CM | POA: Insufficient documentation

## 2022-05-07 DIAGNOSIS — R102 Pelvic and perineal pain: Secondary | ICD-10-CM | POA: Insufficient documentation

## 2022-05-07 DIAGNOSIS — O99331 Smoking (tobacco) complicating pregnancy, first trimester: Secondary | ICD-10-CM | POA: Diagnosis not present

## 2022-05-07 DIAGNOSIS — B9689 Other specified bacterial agents as the cause of diseases classified elsewhere: Secondary | ICD-10-CM | POA: Diagnosis not present

## 2022-05-07 DIAGNOSIS — O3481 Maternal care for other abnormalities of pelvic organs, first trimester: Secondary | ICD-10-CM | POA: Insufficient documentation

## 2022-05-07 DIAGNOSIS — N83291 Other ovarian cyst, right side: Secondary | ICD-10-CM | POA: Insufficient documentation

## 2022-05-07 DIAGNOSIS — F17210 Nicotine dependence, cigarettes, uncomplicated: Secondary | ICD-10-CM | POA: Diagnosis not present

## 2022-05-07 DIAGNOSIS — N760 Acute vaginitis: Secondary | ICD-10-CM | POA: Diagnosis not present

## 2022-05-07 DIAGNOSIS — Z3A01 Less than 8 weeks gestation of pregnancy: Secondary | ICD-10-CM | POA: Diagnosis not present

## 2022-05-07 DIAGNOSIS — O23592 Infection of other part of genital tract in pregnancy, second trimester: Secondary | ICD-10-CM | POA: Diagnosis not present

## 2022-05-07 DIAGNOSIS — B3731 Acute candidiasis of vulva and vagina: Secondary | ICD-10-CM | POA: Insufficient documentation

## 2022-05-07 DIAGNOSIS — R109 Unspecified abdominal pain: Secondary | ICD-10-CM | POA: Diagnosis present

## 2022-05-07 HISTORY — DX: Gestational diabetes mellitus in pregnancy, unspecified control: O24.419

## 2022-05-07 LAB — URINALYSIS, ROUTINE W REFLEX MICROSCOPIC
Bacteria, UA: NONE SEEN
Bilirubin Urine: NEGATIVE
Glucose, UA: NEGATIVE mg/dL
Hgb urine dipstick: NEGATIVE
Ketones, ur: NEGATIVE mg/dL
Leukocytes,Ua: NEGATIVE
Nitrite: NEGATIVE
Protein, ur: NEGATIVE mg/dL
Specific Gravity, Urine: 1.02 (ref 1.005–1.030)
pH: 5 (ref 5.0–8.0)

## 2022-05-07 LAB — HCG, QUANTITATIVE, PREGNANCY: hCG, Beta Chain, Quant, S: 6506 m[IU]/mL — ABNORMAL HIGH (ref <5)

## 2022-05-07 LAB — WET PREP, GENITAL
Sperm: NONE SEEN
Trich, Wet Prep: NONE SEEN
WBC, Wet Prep HPF POC: <10 (ref <10)

## 2022-05-07 LAB — CBC
HCT: 39.1 % (ref 36.0–46.0)
Hemoglobin: 12.9 g/dL (ref 12.0–15.0)
MCH: 27.4 pg (ref 26.0–34.0)
MCHC: 33 g/dL (ref 30.0–36.0)
MCV: 83 fL (ref 80.0–100.0)
Platelets: 296 10*3/uL (ref 150–400)
RBC: 4.71 MIL/uL (ref 3.87–5.11)
RDW: 16.4 % — ABNORMAL HIGH (ref 11.5–15.5)
WBC: 10.4 10*3/uL (ref 4.0–10.5)
nRBC: 0 % (ref 0.0–0.2)

## 2022-05-07 LAB — POCT PREGNANCY, URINE: Preg Test, Ur: POSITIVE — AB

## 2022-05-07 MED ORDER — METRONIDAZOLE 500 MG PO TABS
500.0000 mg | ORAL_TABLET | Freq: Two times a day (BID) | ORAL | 0 refills | Status: DC
Start: 2022-05-07 — End: 2022-07-31

## 2022-05-07 MED ORDER — PREPLUS 27-1 MG PO TABS: 1.0000 | ORAL_TABLET | Freq: Every day | ORAL | 13 refills | Status: DC

## 2022-05-07 MED ORDER — TERCONAZOLE 0.4 % VA CREA: 1.0000 | TOPICAL_CREAM | Freq: Every day | VAGINAL | 0 refills | Status: DC

## 2022-05-07 NOTE — MAU Provider Note (Signed)
History     CSN: [id number]  Arrival date and time: 05/07/22 1700   None     Chief Complaint  Patient presents with   Abdominal Pain   Tamara Bryant , a  32 y.o. G3P2001 at [redacted]w[redacted]d presents to MAU with complaints of abdominal pain. Patient states that Friday she was [place] the gast [place] and she felt a "sharp stabbing" pain the radiated from her vaginal up into her abdomen. She reports that pain was a 10/10. She states she went home and laid down and it "went away." She states has not felt pain like that since Friday but reports currently intermittent period like cramping that she rates a 6/10. She denies attempting to relieve symptoms and currently rates pain a 4/10. She denies worsening or alleviating symptoms. She denies abnormal vaginal discharge, and vaginal bleeding. She denies pain with urination but notes some pressure and discomfort.          OB History     Gravida  3   Para  2   Term  2   Preterm      AB      Living  1      SAB      IAB      Ectopic      Multiple  0   Live Births  1           Past Medical History:  Diagnosis Date   Anemia    Asthma    Gestational diabetes    History of tobacco use    Marijuana use    Obesity    Seasonal allergies     Past Surgical History:  Procedure Laterality Date   WISDOM TOOTH EXTRACTION      Family History  Problem Relation Age of Onset   Hypertension Mother    Sickle cell anemia Mother    Diabetes Father    Hypertension Father    Diabetes Maternal Grandmother    Diabetes Paternal Grandfather     Social History   Tobacco Use   Smoking status: Every Day    Packs/day: .25    Types: Cigarettes   Smokeless tobacco: Former    Quit date: 07/09/2017   Tobacco comments:    07/06/2017  Vaping Use   Vaping Use: Former  Substance Use Topics   Alcohol use: Yes    Comment: Multiple times a week   Drug use: Yes    Types: "Crack" cocaine, Marijuana    Comment: 05/05/22    Allergies: No Known  Allergies  Medications Prior to Admission  Medication Sig Dispense Refill Last Dose   albuterol (PROVENTIL HFA;VENTOLIN HFA) 108 (90 Base) MCG/ACT inhaler Inhale 2 puffs into the lungs every 6 (six) hours as needed for wheezing or shortness of breath.   Past Week   fluticasone (FLONASE) 50 MCG/ACT nasal spray Place 1 spray into both nostrils daily. 15.8 mL 0    loratadine (CLARITIN) 10 MG tablet Take 1 tablet (10 mg total) by mouth daily. 30 tablet 1    metFORMIN (GLUCOPHAGE) 500 MG tablet Take by mouth 2 (two) times daily with a meal.   Unknown   ondansetron (ZOFRAN) 4 MG tablet Take 4 mg by mouth every 8 (eight) hours as needed for nausea or vomiting.   Unknown   Prenatal Vit-Fe Fumarate-FA (PRENATAL MULTIVITAMIN) TABS tablet Take 1 tablet by mouth daily at 12 noon.   Unknown   sertraline (ZOLOFT) 50 MG tablet Take 1 tablet (50  mg total) by mouth daily. 90 tablet 1 Unknown    Review of Systems  Constitutional:  Negative for chills, fatigue and fever.  Eyes:  Negative for pain and visual disturbance.  Respiratory:  Negative for apnea, shortness of breath and wheezing.   Cardiovascular:  Negative for chest pain and palpitations.  Gastrointestinal:  Positive for abdominal pain. Negative for constipation, diarrhea, nausea and vomiting.  Genitourinary:  Positive for pelvic pain. Negative for difficulty urinating, dysuria, vaginal bleeding, vaginal discharge and vaginal pain.  Musculoskeletal:  Negative for back pain.  Neurological:  Negative for seizures, weakness and headaches.  Psychiatric/Behavioral:  Negative for suicidal ideas.    Physical Exam   Blood pressure 126/88, pulse 90, temperature 98.2 F (36.8 C), resp. rate 18, height 5\' 5"  (1.651 m), weight 111.1 kg, last menstrual period 03/29/2022, unknown if currently breastfeeding.  Physical Exam Vitals and nursing note reviewed.  Constitutional:      General: She is not in acute distress.    Appearance: Normal appearance.  HENT:      Head: Normocephalic.  Pulmonary:     Effort: Pulmonary effort is normal.  Abdominal:     Tenderness: There is no abdominal tenderness.  Musculoskeletal:     Cervical back: Normal range of motion.  Skin:    General: Skin is warm and dry.  Neurological:     Mental Status: She is alert and oriented to person, place, and time.  Psychiatric:        Mood and Affect: Mood normal.     MAU Course  Procedures Orders Placed This Encounter  Procedures   Wet prep, genital   US OB LESS THAN 14 WEEKS WITH OB TRANSVAGINAL   Urinalysis, Routine w reflex microscopic -Urine, Clean Catch   CBC   hCG, quantitative, pregnancy   Diet NPO time specified   Pregnancy, urine POC   ABO/Rh   Results for orders placed or performed during the hospital encounter of 05/07/22 (from the past 24 hour(s))  Pregnancy, urine POC     Status: Abnormal   Collection Time: 05/07/22  5:11 PM  Result Value Ref Range   Preg Test, Ur POSITIVE (A) NEGATIVE  Urinalysis, Routine w reflex microscopic -Urine, Clean Catch     Status: None   Collection Time: 05/07/22  5:17 PM  Result Value Ref Range   Color, Urine YELLOW YELLOW   APPearance CLEAR CLEAR   Specific Gravity, Urine 1.020 1.005 - 1.030   pH 5.0 5.0 - 8.0   Glucose, UA NEGATIVE NEGATIVE mg/dL   Hgb urine dipstick NEGATIVE NEGATIVE   Bilirubin Urine NEGATIVE NEGATIVE   Ketones, ur NEGATIVE NEGATIVE mg/dL   Protein, ur NEGATIVE NEGATIVE mg/dL   Nitrite NEGATIVE NEGATIVE   Leukocytes,Ua NEGATIVE NEGATIVE   RBC / HPF 0-5 0 - 5 RBC/hpf   WBC, UA 0-5 0 - 5 WBC/hpf   Bacteria, UA NONE SEEN NONE SEEN   Squamous Epithelial / HPF 0-5 0 - 5 /HPF   Mucus PRESENT   ABO/Rh     Status: None   Collection Time: 05/07/22  5:31 PM  Result Value Ref Range   ABO/RH(D)      A POS Performed at [facility], [address], [place], [place] [address]   CBC     Status: Abnormal   Collection Time: 05/07/22  5:39 PM  Result Value Ref Range   WBC 10.4 4.0 - 10.5  K/uL   RBC 4.71 3.87 - 5.11 MIL/uL  Hemoglobin 12.9 12.0 - 15.0 g/dL   HCT 16.1 09.6 - 04.5 %   MCV 83.0 80.0 - 100.0 fL   MCH 27.4 26.0 - 34.0 pg   MCHC 33.0 30.0 - 36.0 g/dL   RDW 40.9 (H) 81.1 - 91.4 %   Platelets 296 150 - 400 K/uL   nRBC 0.0 0.0 - 0.2 %  Wet prep, genital     Status: Abnormal   Collection Time: 05/07/22  5:40 PM   Specimen: PATH Cytology Cervicovaginal Ancillary Only  Result Value Ref Range   Yeast Wet Prep HPF POC PRESENT (A) NONE SEEN   Trich, Wet Prep NONE SEEN NONE SEEN   Clue Cells Wet Prep HPF POC PRESENT (A) NONE SEEN   WBC, Wet Prep HPF POC <10 <10   Sperm NONE SEEN    US OB LESS THAN 14 WEEKS WITH OB TRANSVAGINAL  Result Date: 05/07/2022 CLINICAL DATA:  Initial evaluation for acute abdominal pain, early pregnancy. EXAM: OBSTETRIC <14 WK [place] AND TRANSVAGINAL OB US TECHNIQUE: Both transabdominal and transvaginal ultrasound examinations were performed for complete evaluation of the gestation as well as the maternal uterus, adnexal regions, and pelvic cul-de-sac. Transvaginal technique was performed to assess early pregnancy. COMPARISON:  None Available. FINDINGS: Intrauterine gestational sac: Single Yolk sac:  Present Embryo:  Negative. Cardiac Activity: Negative. Heart Rate: N/A MSD: 8.3 mm   5 w   4 d Subchorionic hemorrhage:  None visualized. Maternal uterus/adnexae: Left ovary within normal limits. 4.4 x 3.6 x 4.1 cm simple right ovarian cyst present. No other adnexal mass or free fluid. IMPRESSION: 1. Early intrauterine gestational sac with internal yolk sac, but no fetal pole or cardiac activity yet visualized. Recommend follow-up quantitative B-HCG levels and follow-up US in 14 days to confirm and assess viability. 2. 4.4 cm simple right ovarian cyst. 3. No other acute maternal uterine or adnexal abnormality. Electronically Signed   By: Rise Mu M.D.   On: 05/07/2022 18:54    MDM - Wet prep positive for yeast and BV  - GC pending upon discharge   - UA normal low suspicion for UTI  - [place] results revealed a single IUP ~ 5 weeks 4 day equal to LMP.  - plan for discharge   Assessment and Plan   1. Pelvic pain   2. Vaginal yeast infection   3. Bacterial vaginosis   4. [redacted] weeks gestation of pregnancy    - Reviewed that vaginal infections like BV and yeast can cause cramping and pelvic discomfort.  - Reviewed treatment options. Rx for Metronidazole and Terazol 7 sent to outpatient pharmacy for pickup.  - Recommended that patient seek prenatal care at the office of her choosing  - Worsening signs and return precautions reviewed  - Patient discharged home in stable condition and may return to MAU as needed.   Claudette Head, MSN CNM  05/07/2022, 5:50 PM

## 2022-05-07 NOTE — MAU Note (Signed)
.  Tamara Bryant is a 32 y.o. at Unknown here in MAU reporting: had a positive HPT last week. C/o sharp pain in her vaginal are for a few days. Denies any vag bleeding or discharge. Also c/o feeling nauseated on and off and feel weak and dizzy at times. Pt also reports she was told she had an umbilical hernia During her last pregnancy. Went to a Careers adviser after she had the baby but her did not feel it was that. Pt stated when she cough she feels that area bulge out LMP: 03/29/22 Onset of complaint: a few days Pain score: 8 Vitals:   05/07/22 1716  BP: 126/88  Pulse: 90  Resp: 18  Temp: 98.2 F (36.8 C)     FHT:n/a Lab orders placed from triage:  UPT, U/A

## 2022-05-08 LAB — GC/CHLAMYDIA PROBE AMP (~~LOC~~) NOT AT ARMC
Chlamydia: NEGATIVE
Comment: NEGATIVE
Comment: NORMAL
Neisseria Gonorrhea: NEGATIVE

## 2022-05-08 LAB — ABO/RH: ABO/RH(D): A POS

## 2022-07-10 ENCOUNTER — Ambulatory Visit: Payer: Medicaid Other | Admitting: Internal Medicine

## 2022-07-25 ENCOUNTER — Telehealth (INDEPENDENT_AMBULATORY_CARE_PROVIDER_SITE_OTHER): Payer: Medicaid Other

## 2022-07-25 DIAGNOSIS — Z87898 Personal history of other specified conditions: Secondary | ICD-10-CM

## 2022-07-25 DIAGNOSIS — O0992 Supervision of high risk pregnancy, unspecified, second trimester: Secondary | ICD-10-CM

## 2022-07-25 DIAGNOSIS — Z3A16 16 weeks gestation of pregnancy: Secondary | ICD-10-CM

## 2022-07-25 DIAGNOSIS — O0990 Supervision of high risk pregnancy, unspecified, unspecified trimester: Secondary | ICD-10-CM | POA: Insufficient documentation

## 2022-07-25 NOTE — Progress Notes (Signed)
New OB Intake  I connected with Tamara Bryant  on 07/26/22 at  3:15 PM EDT by telephone and verified that I am speaking with the correct person using two identifiers. Approx 30 minutes spent on phone with patient. Nurse is located at [facility] and pt is located in personal vehicle as a passenger.  I discussed the limitations, risks, security and privacy concerns of performing an evaluation and management service by telephone and the availability of in person appointments. I also discussed with the patient that there may be a patient responsible charge related to this service. The patient expressed understanding and agreed to proceed.  I explained I am completing New OB Intake today. We discussed EDD of 01/03/2023, by Last Menstrual Period. Pt is G3P2001. I reviewed her allergies, medications and Medical/Surgical/OB history. Patient reports during visit today she last used cocaine 1 month ago. Reports previous marijuana consumption; last in April 2024. Patient does not feel she has any past or current problem with substance use.  Patient Active Problem List   Diagnosis Date Noted   History of substance use 07/26/2022   Supervision of high risk pregnancy, antepartum 07/25/2022   High blood hemoglobin F (HCC) 10/09/2017   Obesity (BMI 30-39.9) 08/13/2017   Mild intermittent asthma 08/13/2017   Concerns addressed today  Delivery Plans Plans to deliver at [facility]. Discussed the nature of our practice with multiple providers including residents and students. Due to the size of the practice, the delivering provider may not be the same as those providing prenatal care.   MyChart/Babyscripts MyChart access verified. I explained pt will have some visits in office and some virtually. Babyscripts will be reviewed at new OB visit.  Blood Pressure Cuff/Weight Scale Review at new OB visit.  Anatomy US Explained first scheduled [place] will be around 19 weeks. Anatomy US scheduled for 08/21/22.  Is patient a  CenteringPregnancy candidate?  Not a Candidate Not a candidate due to Complex coordination of care needed   Is patient a Mom+Baby Combined Care candidate?  Not a candidate    First visit review I reviewed new OB appt with patient. Explained pt will be seen by Millbrook Bing, MD at first visit. Will need routine labs with [facility]. Carrier screening completed 08/13/2017 (results available in record). Needs Pap smear.  Last Pap Accessed in Care Everywhere at [facility]. Last Pap 01/31/2019 NILM.  Marjo Bicker, RN 07/26/2022  10:53 AM

## 2022-07-26 DIAGNOSIS — Z87898 Personal history of other specified conditions: Secondary | ICD-10-CM | POA: Insufficient documentation

## 2022-07-31 ENCOUNTER — Other Ambulatory Visit: Payer: Self-pay | Admitting: Family Medicine

## 2022-07-31 ENCOUNTER — Encounter (HOSPITAL_COMMUNITY): Payer: Self-pay | Admitting: Obstetrics and Gynecology

## 2022-07-31 ENCOUNTER — Inpatient Hospital Stay (HOSPITAL_COMMUNITY)
Admission: AD | Admit: 2022-07-31 | Discharge: 2022-07-31 | Disposition: A | Discharge status: Home / Self Care | Payer: Medicaid Other | Attending: Obstetrics and Gynecology | Admitting: Obstetrics and Gynecology

## 2022-07-31 DIAGNOSIS — R079 Chest pain, unspecified: Secondary | ICD-10-CM | POA: Diagnosis not present

## 2022-07-31 DIAGNOSIS — R1084 Generalized abdominal pain: Secondary | ICD-10-CM | POA: Diagnosis present

## 2022-07-31 DIAGNOSIS — Z3A17 17 weeks gestation of pregnancy: Secondary | ICD-10-CM | POA: Diagnosis not present

## 2022-07-31 DIAGNOSIS — O209 Hemorrhage in early pregnancy, unspecified: Secondary | ICD-10-CM

## 2022-07-31 DIAGNOSIS — F17210 Nicotine dependence, cigarettes, uncomplicated: Secondary | ICD-10-CM | POA: Diagnosis not present

## 2022-07-31 DIAGNOSIS — R519 Headache, unspecified: Secondary | ICD-10-CM | POA: Diagnosis not present

## 2022-07-31 DIAGNOSIS — O23592 Infection of other part of genital tract in pregnancy, second trimester: Secondary | ICD-10-CM | POA: Insufficient documentation

## 2022-07-31 DIAGNOSIS — R1030 Lower abdominal pain, unspecified: Secondary | ICD-10-CM | POA: Insufficient documentation

## 2022-07-31 DIAGNOSIS — R0789 Other chest pain: Secondary | ICD-10-CM | POA: Insufficient documentation

## 2022-07-31 DIAGNOSIS — N949 Unspecified condition associated with female genital organs and menstrual cycle: Secondary | ICD-10-CM

## 2022-07-31 DIAGNOSIS — O99332 Smoking (tobacco) complicating pregnancy, second trimester: Secondary | ICD-10-CM | POA: Diagnosis not present

## 2022-07-31 DIAGNOSIS — O26892 Other specified pregnancy related conditions, second trimester: Secondary | ICD-10-CM | POA: Insufficient documentation

## 2022-07-31 DIAGNOSIS — G43009 Migraine without aura, not intractable, without status migrainosus: Secondary | ICD-10-CM | POA: Diagnosis present

## 2022-07-31 LAB — URINALYSIS, ROUTINE W REFLEX MICROSCOPIC
Bilirubin Urine: NEGATIVE
Glucose, UA: NEGATIVE mg/dL
Hgb urine dipstick: NEGATIVE
Ketones, ur: NEGATIVE mg/dL
Nitrite: NEGATIVE
Protein, ur: NEGATIVE mg/dL
Specific Gravity, Urine: 1.015 (ref 1.005–1.030)
pH: 8 (ref 5.0–8.0)

## 2022-07-31 LAB — WET PREP, GENITAL
Sperm: NONE SEEN
Trich, Wet Prep: NONE SEEN
WBC, Wet Prep HPF POC: <10 (ref <10)
Yeast Wet Prep HPF POC: NONE SEEN

## 2022-07-31 LAB — URINALYSIS, MICROSCOPIC (REFLEX)

## 2022-07-31 MED ORDER — METRONIDAZOLE 500 MG PO TABS: 500.0000 mg | ORAL_TABLET | Freq: Two times a day (BID) | ORAL | 0 refills | Status: DC

## 2022-07-31 MED ORDER — FAMOTIDINE 20 MG PO TABS
20.0000 mg | ORAL_TABLET | Freq: Every day | ORAL | Status: DC
  Administered 2022-07-31: 20 mg via ORAL
  Filled 2022-07-31: qty 1

## 2022-07-31 MED ORDER — ACETAMINOPHEN-CAFFEINE 500-65 MG PO TABS
2.0000 | ORAL_TABLET | Freq: Once | ORAL | Status: AC
  Administered 2022-07-31: 2 via ORAL
  Filled 2022-07-31: qty 2

## 2022-07-31 MED ORDER — FAMOTIDINE 20 MG PO TABS: 20.0000 mg | ORAL_TABLET | Freq: Every day | ORAL | 0 refills | Status: DC

## 2022-07-31 NOTE — MAU Provider Note (Signed)
History     CSN: [id number]  Arrival date and time: 07/31/22 1256   None     Chief Complaint  Patient presents with   Abdominal Pain   Chest Pain   Headache   HPI Tamara Bryant is a 32 yo G3P2001 @ [redacted]w[redacted]d presenting for abdominal pain which she reports is a labor.  Worse in the groins bilaterally.  She also has right upper chest pain which has been going on for the past few days off and on.  Reports it is a burning sensation.  Also complains of "migraines".  This has been going on for the last 5 days and she has been taking ibuprofen which helps with the headache and then it returns.  Reports noticing some bleeding when she wiped when she went to the bathroom this morning it was a light pinkish tinge.  Reports feeling occasional fetal movement.  Acknowledges that [place] her [place] but did scrape her knee up and foot up in the process.  Denies any abdominal trauma.  Is sexually active with last sexual activity yesterday.  OB History     Gravida  3   Para  2   Term  2   Preterm      AB      Living  2      SAB      IAB      Ectopic      Multiple  0   Live Births  2           Past Medical History:  Diagnosis Date   Anemia    Asthma    Gestational diabetes    History of chlamydia and gonorrhea  08/13/2017   History of tobacco use    History of Trichomonas in Pregnancy 10/31/2017   Marijuana use    Obesity    Seasonal allergies     Past Surgical History:  Procedure Laterality Date   WISDOM TOOTH EXTRACTION      Family History  Problem Relation Age of Onset   Hypertension Mother    Sickle cell anemia Mother    Diabetes Father    Hypertension Father    Diabetes Maternal Grandfather    Diabetes Paternal Grandmother     Social History   Tobacco Use   Smoking status: Every Day    Current packs/day: 1.00    Types: Cigarettes   Smokeless tobacco: Former    Quit date: 07/09/2017   Tobacco comments:     07/06/2017  Vaping Use   Vaping status: Former  Substance Use Topics   Alcohol use: Not Currently    Alcohol/week: 4.0 standard drinks of alcohol    Types: 1 Glasses of wine, 3 Shots of liquor per week   Drug use: Yes    Types: "Crack" cocaine, Marijuana    Comment: Cocaine June 2024, [place] April 2024    Allergies: No Known Allergies  Medications Prior to Admission  Medication Sig Dispense Refill Last Dose   albuterol (PROVENTIL HFA;VENTOLIN HFA) 108 (90 Base) MCG/ACT inhaler Inhale 2 puffs into the lungs every 6 (six) hours as needed for wheezing or shortness of breath.      cetirizine (ZYRTEC) 10 MG tablet Take 10 mg by mouth as needed for allergies.      metroNIDAZOLE (FLAGYL) 500 MG tablet Take 1 tablet (500 mg total) by mouth 2 (two) times daily. (Patient not taking: Reported on 07/25/2022) 14 tablet 0  Prenatal Vit-Fe Fumarate-FA (PREPLUS) 27-1 MG TABS Take 1 tablet by mouth daily. (Patient not taking: Reported on 07/25/2022) 30 tablet 13    terconazole (TERAZOL 7) 0.4 % vaginal cream Place 1 applicator vaginally at bedtime. Use for seven days (Patient not taking: Reported on 07/25/2022) 45 g 0     Review of Systems  Constitutional:  Negative for chills and fever.  HENT:  Negative for congestion and rhinorrhea.   Eyes:  Negative for visual disturbance.  Respiratory:  Negative for shortness of breath.   Cardiovascular:  Positive for chest pain.  Gastrointestinal:  Positive for abdominal pain and constipation. Negative for diarrhea, nausea and vomiting.  Endocrine: Negative for polyuria.  Genitourinary:  Positive for vaginal bleeding and vaginal discharge. Negative for vaginal pain.  Neurological:  Positive for headaches.   Physical Exam   Blood pressure 113/69, pulse 89, temperature 98.2 F (36.8 C), temperature source Oral, resp. rate 18, height 5\' 5"  (1.651 m), weight 108.2 kg, last menstrual period 03/29/2022, SpO2 99%, unknown if currently breastfeeding.  Physical  Exam Vitals reviewed.  Constitutional:      Appearance: She is well-developed.  HENT:     Head: Normocephalic and atraumatic.  Cardiovascular:     Rate and Rhythm: Normal rate and regular rhythm.  Pulmonary:     Effort: Pulmonary effort is normal. No respiratory distress.  Abdominal:     Palpations: Abdomen is soft.     Tenderness: There is no abdominal tenderness. There is no right CVA tenderness or left CVA tenderness.  Skin:    General: Skin is warm.     Capillary Refill: Capillary refill takes less than 2 seconds.  Neurological:     General: No focal deficit present.     Mental Status: She is alert.  Psychiatric:        Mood and Affect: Mood normal.     MAU Course  Procedures  MDM Wet prep GC/chlamydia UA  Assessment and Plan  Tamara Bryant is a 32 yo G3P2 presenting at [redacted]w[redacted]d for bilateral inguinal pain, headache, chest pain.  Patient also noted blood on tissue paper today when she wiped.  Bilateral inguinal pain, headache Patient reports intermittent episodes of bilateral inguinal pain.  Typically not at the same time.  Does not have any triggers for when it occurs and it resolved spontaneously.  Gave a dose of Tylenol and symptoms improved.  Likely round ligament pain.  Discussed pregnancy belt and use of Tylenol.  Recommended no ibuprofen.  Patient is agreeable.  Regarding the headache that patient's headache improved with Tylenol as well.  Chest pain EKG showing normal sinus rhythm patient given a dose of Pepcid with concern for possible reflux and symptoms improved.  Feel that this is likely reflux related.  Will send prescription for Pepcid to patient's pharmacy.  Strict return precautions given and patient is agreeable.  Patient discharged home  Vaginal bleeding Wet prep positive for BV.  Patient had intercourse this morning.  No bleeding currently.  Likely related to BV infection.  Will treat and continue to monitor.  Discussed return precautions regarding  bleeding and patient is understanding.Cyndi Lennert Matthias Bogus 07/31/2022, 1:32 PM

## 2022-07-31 NOTE — MAU Note (Signed)
RN called lab to inquire about Urinalysis. Lab tech, [employer], answered and informed RN that  the machine is down and each sample is being run manually, but that the specimen should be done earlier .

## 2022-07-31 NOTE — Discharge Instructions (Signed)
It is a pleasure taking care of you today!  I think your abdominal and inguinal pain is likely related to round ligament pain.  I recommend use of a pregnancy belt and he can take Tylenol if needed.  For your headaches you can also take Tylenol.  Please avoid NSAIDs like ibuprofen.  Your chest pain is likely reflux related, to take as needed for this acid reflux.  I hope you get to feeling better.  Your bleeding is likely related to your BV infection.  If your bleeding worsens or you have any concerns please return for further evaluation.  I hope you have a great afternoon!

## 2022-07-31 NOTE — MAU Note (Signed)
Tamara Bryant is a 32 y.o. at [redacted]w[redacted]d here in MAU reporting: having sharp pain in abd (allover- upper and lower).  Been having  migraines- past 5 days, takes Ibuprofen, goes away and then comes back.  Been having chest pain, only on the upper rt side of her chest.- sharp pains.  Onset of complaint: past wk Pain score: abd 8, HA 8, chest 7 Vitals:   07/31/22 1312  BP: 113/69  Pulse: 89  Resp: 18  Temp: 98.2 F (36.8 C)  SpO2: 99%     [id number] dress Lab orders placed from triage:  urine

## 2022-08-02 ENCOUNTER — Ambulatory Visit (INDEPENDENT_AMBULATORY_CARE_PROVIDER_SITE_OTHER): Payer: Medicaid Other | Admitting: Obstetrics and Gynecology

## 2022-08-02 ENCOUNTER — Encounter: Payer: Self-pay | Admitting: Obstetrics and Gynecology

## 2022-08-02 ENCOUNTER — Other Ambulatory Visit: Payer: Self-pay

## 2022-08-02 ENCOUNTER — Other Ambulatory Visit (HOSPITAL_COMMUNITY): Admission: RE | Admit: 2022-08-02 | Payer: Medicaid Other | Source: Ambulatory Visit

## 2022-08-02 VITALS — BP 102/74 | HR 87 | Wt 236.1 lb

## 2022-08-02 DIAGNOSIS — Z6839 Body mass index (BMI) 39.0-39.9, adult: Secondary | ICD-10-CM | POA: Diagnosis not present

## 2022-08-02 DIAGNOSIS — Z8632 Personal history of gestational diabetes: Secondary | ICD-10-CM | POA: Insufficient documentation

## 2022-08-02 DIAGNOSIS — Z3A18 18 weeks gestation of pregnancy: Secondary | ICD-10-CM

## 2022-08-02 DIAGNOSIS — O09892 Supervision of other high risk pregnancies, second trimester: Secondary | ICD-10-CM

## 2022-08-02 DIAGNOSIS — O99212 Obesity complicating pregnancy, second trimester: Secondary | ICD-10-CM

## 2022-08-02 DIAGNOSIS — B3731 Acute candidiasis of vulva and vagina: Secondary | ICD-10-CM

## 2022-08-02 DIAGNOSIS — O0992 Supervision of high risk pregnancy, unspecified, second trimester: Secondary | ICD-10-CM

## 2022-08-02 DIAGNOSIS — O0990 Supervision of high risk pregnancy, unspecified, unspecified trimester: Secondary | ICD-10-CM

## 2022-08-02 MED ORDER — ASPIRIN 81 MG PO TBEC: 81.0000 mg | DELAYED_RELEASE_TABLET | Freq: Every day | ORAL | 2 refills | Status: DC

## 2022-08-02 MED ORDER — PREPLUS 27-1 MG PO TABS: 1.0000 | ORAL_TABLET | Freq: Every day | ORAL | 2 refills | Status: DC

## 2022-08-02 MED ORDER — MICONAZOLE NITRATE 2 % VA CREA
1.0000 | TOPICAL_CREAM | Freq: Every day | VAGINAL | 2 refills | Status: DC
Start: 2022-08-02 — End: 2022-09-27

## 2022-08-03 ENCOUNTER — Encounter: Payer: Self-pay | Admitting: Obstetrics and Gynecology

## 2022-08-03 NOTE — Progress Notes (Signed)
New OB Note  08/02/2022   Clinic: Center for [facility]   Chief Complaint: new OB  Transfer of Care Patient: no  History of Present Illness: Tamara Bryant is a 32 y.o. G3P2002 at 18/0 weeks (EDC 1/1, based on Patient's last menstrual period was 03/29/2022.=5wk u/s).  Preg complicated by has BMI 39.0-39.9,adult; Mild intermittent asthma; High blood hemoglobin F (HCC); Supervision of high risk pregnancy, antepartum; History of substance use; Short interval between pregnancies affecting pregnancy in second trimester, antepartum; History of gestational diabetes mellitus (GDM); and Obesity affecting pregnancy in second trimester on their problem list.  No OB s/s.   ROS: A 12-point review of systems was performed and negative, except as stated in the above HPI.  OBGYN History: As per HPI. OB History  Gravida Para Term Preterm AB Living  3 2 2     2   SAB IAB Ectopic Multiple Live Births        0 2    # Outcome Date GA Lbr Len/2nd Weight Sex Type Anes PTL Lv  3 Current           2 Term 06/16/21 [redacted]w[redacted]d    Vag-Spont   LIV  1 Term 03/01/18 [redacted]w[redacted]d / 00:30 6 lb 14.2 oz (3.124 kg) F Vag-Spont EPI  LIV     Birth Comments: WNL    Obstetric Comments  06/2021: in [place], [place]. Had GDM. They recommended meds but pt didn't start. Pt unsure of exact weight but was 7+ lbs.      Past Medical History: Past Medical History:  Diagnosis Date   Anemia    Asthma    Gestational diabetes    History of chlamydia and gonorrhea  08/13/2017   History of tobacco use    History of Trichomonas in Pregnancy 10/31/2017   Marijuana use    Obesity    Seasonal allergies     Past Surgical History: Past Surgical History:  Procedure Laterality Date   WISDOM TOOTH EXTRACTION      Family History:  Family History  Problem Relation Age of Onset   Hypertension Mother    Sickle cell anemia Mother    Diabetes Father    Hypertension Father    Diabetes Maternal Grandfather    Diabetes  Paternal Grandmother     Social History:  Social History   Socioeconomic History   Marital status: Single    Spouse name: Not on file   Number of children: Not on file   Years of education: Not on file   Highest education level: Not on file  Occupational History   Not on file  Tobacco Use   Smoking status: Every Day    Current packs/day: 0.50    Types: Cigarettes   Smokeless tobacco: Former    Quit date: 07/09/2017   Tobacco comments:    07/06/2017  Vaping Use   Vaping status: Former  Substance and Sexual Activity   Alcohol use: Not Currently    Alcohol/week: 4.0 standard drinks of alcohol    Types: 1 Glasses of wine, 3 Shots of liquor per week   Drug use: Not Currently    Types: Marijuana, Cocaine    Comment: Cocaine June 2024, Marijauna April 2024   Sexual activity: Yes    Birth control/protection: None  Other Topics Concern   Not on file  Social History Narrative   Currently in school for business, entrepreneurship    Social Determinants of Health   Financial Resource Strain: Not on file  Food Insecurity: Food Insecurity Present (01/09/2018)   Hunger Vital Sign    Worried About Running Out of Food in the Last Year: Sometimes true    Ran Out of Food in the Last Year: Sometimes true  Transportation Needs: No Transportation Needs (01/09/2018)   PRAPARE - Administrator, Civil Service (Medical): No    Lack of Transportation (Non-Medical): No  Physical Activity: Not on file  Stress: Not on file  Social Connections: Not on file  Intimate Partner Violence: Not on file   Allergy: No Known Allergies   Current Outpatient Medications: None Physical Exam:   BP 102/74   Pulse 87   Wt 236 lb 1.6 oz (107.1 kg)   LMP 03/29/2022   BMI 39.29 kg/m  Body mass index is 39.29 kg/m. Contractions: Not present Vag. Bleeding: None. Fundal height: not applicable FHTs: 150s  General appearance: Well nourished, well developed female in no acute distress.  Neck:   Supple, normal appearance, and no thyromegaly  Cardiovascular: S1, S2 normal, no murmur, rub or gallop, regular rate and rhythm Respiratory:  Clear to auscultation bilateral. Normal respiratory effort Abdomen: positive bowel sounds and no masses, hernias; diffusely non tender to palpation, non distended Breasts: patient declines any s/s. Marland Kitchen Neuro/Psych:  Normal mood and affect.  Skin:  Warm and dry.  Lymphatic:  No inguinal lymphadenopathy.   Pelvic exam: is not limited by body habitus EGBUS: within normal limits, Vagina: copious amount of cottage cheese white d/c, no blood in the vault, Cervix: normal appearing cervix closed/long/high, Uterus:  enlarged, c/w 18 week size, and Adnexa:  normal adnexa and no mass, fullness, tenderness  Laboratory: MAU labs reviewed  Imaging:  No new imaging  Assessment: patient doing well  Plan: 1. Supervision of high risk pregnancy, antepartum Labs today. Has anatomy on 8/19. Vitamins sent in. Pt amenable to low dose ASA - Cytology - PAP( [place]) - Comprehensive metabolic panel - Culture, OB Urine - Hemoglobin A1c - CBC/D/Plt+RPR+Rh+ABO+RubIgG... - Protein / creatinine ratio, urine - PANORAMA PRENATAL TEST - [place] Basic Panel - AFP, Serum, Open Spina Bifida - TSH Rfx on Abnormal to Free T4 - [facility] AMB BABYSCRIPTS SCHEDULE OPTIMIZATION  2. [redacted] weeks gestation of pregnancy - CHL AMB BABYSCRIPTS SCHEDULE OPTIMIZATION  3. Short interval between pregnancies affecting pregnancy in second trimester, antepartum  4. BMI 39.0-39.9,adult - TSH Rfx on Abnormal to Free T4  5. History of gestational diabetes mellitus (GDM) Early A1c today - TSH Rfx on Abnormal to Free T4  6. Obesity affecting pregnancy in second trimester, unspecified obesity type - TSH Rfx on Abnormal to Free T4  7. Vulvovaginal candidiasis Didn't pick up prior meds. Monistat sent in  Problem list reviewed and updated.  Follow up 4 weeks  The nature of [facility] with multiple MDs and other Advanced Practice Providers was explained to patient; also emphasized that residents, students are part of our team.  >50% of 30 min visit spent on counseling and coordination of care.     Cornelia Copa MD Attending Center for [facility])

## 2022-08-18 ENCOUNTER — Encounter (HOSPITAL_COMMUNITY): Payer: Self-pay | Admitting: Obstetrics and Gynecology

## 2022-08-18 ENCOUNTER — Inpatient Hospital Stay (HOSPITAL_COMMUNITY)
Admission: AD | Admit: 2022-08-18 | Discharge: 2022-08-19 | Disposition: A | Discharge status: Home / Self Care | Payer: Medicaid Other | Attending: Obstetrics and Gynecology | Admitting: Obstetrics and Gynecology

## 2022-08-18 ENCOUNTER — Inpatient Hospital Stay (HOSPITAL_COMMUNITY): Payer: Medicaid Other

## 2022-08-18 DIAGNOSIS — R079 Chest pain, unspecified: Secondary | ICD-10-CM

## 2022-08-18 DIAGNOSIS — N6311 Unspecified lump in the right breast, upper outer quadrant: Secondary | ICD-10-CM | POA: Diagnosis not present

## 2022-08-18 DIAGNOSIS — N644 Mastodynia: Secondary | ICD-10-CM | POA: Diagnosis present

## 2022-08-18 DIAGNOSIS — R0789 Other chest pain: Secondary | ICD-10-CM | POA: Diagnosis present

## 2022-08-18 DIAGNOSIS — F17210 Nicotine dependence, cigarettes, uncomplicated: Secondary | ICD-10-CM | POA: Diagnosis not present

## 2022-08-18 DIAGNOSIS — N63 Unspecified lump in unspecified breast: Secondary | ICD-10-CM

## 2022-08-18 DIAGNOSIS — M549 Dorsalgia, unspecified: Secondary | ICD-10-CM

## 2022-08-18 DIAGNOSIS — M546 Pain in thoracic spine: Secondary | ICD-10-CM | POA: Diagnosis not present

## 2022-08-18 DIAGNOSIS — M7918 Myalgia, other site: Secondary | ICD-10-CM | POA: Insufficient documentation

## 2022-08-18 DIAGNOSIS — R1011 Right upper quadrant pain: Secondary | ICD-10-CM | POA: Diagnosis present

## 2022-08-18 DIAGNOSIS — O26892 Other specified pregnancy related conditions, second trimester: Secondary | ICD-10-CM | POA: Insufficient documentation

## 2022-08-18 DIAGNOSIS — O99332 Smoking (tobacco) complicating pregnancy, second trimester: Secondary | ICD-10-CM | POA: Insufficient documentation

## 2022-08-18 DIAGNOSIS — Z3A20 20 weeks gestation of pregnancy: Secondary | ICD-10-CM | POA: Diagnosis not present

## 2022-08-18 LAB — COMPREHENSIVE METABOLIC PANEL
ALT: 10 U/L (ref 0–44)
AST: 12 U/L — ABNORMAL LOW (ref 15–41)
Albumin: 2.7 g/dL — ABNORMAL LOW (ref 3.5–5.0)
Alkaline Phosphatase: 42 U/L (ref 38–126)
Anion gap: 10 (ref 5–15)
BUN: <5 mg/dL — ABNORMAL LOW (ref 6–20)
CO2: 24 mmol/L (ref 22–32)
Calcium: 9.3 mg/dL (ref 8.9–10.3)
Chloride: 102 mmol/L (ref 98–111)
Creatinine, Ser: 0.71 mg/dL (ref 0.44–1.00)
GFR, Estimated: >60 mL/min (ref >60)
Glucose, Bld: 90 mg/dL (ref 70–99)
Potassium: 4 mmol/L (ref 3.5–5.1)
Sodium: 136 mmol/L (ref 135–145)
Total Bilirubin: 0.3 mg/dL (ref 0.3–1.2)
Total Protein: 5.5 g/dL — ABNORMAL LOW (ref 6.5–8.1)

## 2022-08-18 LAB — CBC
HCT: 34.3 % — ABNORMAL LOW (ref 36.0–46.0)
Hemoglobin: 11.3 g/dL — ABNORMAL LOW (ref 12.0–15.0)
MCH: 27 pg (ref 26.0–34.0)
MCHC: 32.9 g/dL (ref 30.0–36.0)
MCV: 82.1 fL (ref 80.0–100.0)
Platelets: 284 10*3/uL (ref 150–400)
RBC: 4.18 MIL/uL (ref 3.87–5.11)
RDW: 15 % (ref 11.5–15.5)
WBC: 13.3 10*3/uL — ABNORMAL HIGH (ref 4.0–10.5)
nRBC: 0 % (ref 0.0–0.2)

## 2022-08-18 LAB — LIPASE, BLOOD: Lipase: 24 U/L (ref 11–51)

## 2022-08-18 LAB — TROPONIN I (HIGH SENSITIVITY): Troponin I (High Sensitivity): <2 ng/L (ref <18)

## 2022-08-18 MED ORDER — CYCLOBENZAPRINE HCL 10 MG PO TABS
10.0000 mg | ORAL_TABLET | Freq: Two times a day (BID) | ORAL | 0 refills | Status: DC | PRN
Start: 2022-08-18 — End: 2022-10-16

## 2022-08-18 MED ORDER — CYCLOBENZAPRINE HCL 5 MG PO TABS
10.0000 mg | ORAL_TABLET | Freq: Once | ORAL | Status: AC
  Administered 2022-08-18: 10 mg via ORAL
  Filled 2022-08-18: qty 2

## 2022-08-18 NOTE — MAU Provider Note (Signed)
Chief Complaint:  pain in chest area    Event Date/Time   First Provider Initiated Contact with Patient 08/18/22 2033      HPI: Tamara Bryant is a 32 y.o. G3P2002 at [redacted]w[redacted]d who presents to maternity admissions reporting pain in the right side of her chest and RUQ abdomen, radiating into her right upper back and pain in her right breast.  Pt reports recent cough and nasal congestion.  She denies regular cramping/contractions, LOF, vaginal bleeding.   She reports good fetal movement.   HPI  Past Medical History: Past Medical History:  Diagnosis Date   Anemia    Asthma    Gestational diabetes    History of chlamydia and gonorrhea  08/13/2017   History of tobacco use    History of Trichomonas in Pregnancy 10/31/2017   Marijuana use    Obesity    Seasonal allergies     Past obstetric history: OB History  Gravida Para Term Preterm AB Living  3 2 2     2   SAB IAB Ectopic Multiple Live Births        0 2    # Outcome Date GA Lbr Len/2nd Weight Sex Type Anes PTL Lv  3 Current           2 Term 06/16/21 [redacted]w[redacted]d    Vag-Spont   LIV  1 Term 03/01/18 [redacted]w[redacted]d / 00:30 3124 g F Vag-Spont EPI  LIV     Birth Comments: WNL    Obstetric Comments  06/2021: in [place], [place]. Had GDM. They recommended meds but pt didn't start. Pt unsure of exact weight but was 7+ lbs.     Past Surgical History: Past Surgical History:  Procedure Laterality Date   WISDOM TOOTH EXTRACTION      Family History: Family History  Problem Relation Age of Onset   Hypertension Mother    Sickle cell anemia Mother    Diabetes Father    Hypertension Father    Diabetes Maternal Grandfather    Diabetes Paternal Grandmother     Social History: Social History   Tobacco Use   Smoking status: Every Day    Current packs/day: 0.50    Types: Cigarettes   Smokeless tobacco: Former    Quit date: 07/09/2017   Tobacco comments:    07/06/2017  Vaping Use   Vaping status: Former  Substance Use Topics   Alcohol use: Not  Currently    Alcohol/week: 4.0 standard drinks of alcohol    Types: 1 Glasses of wine, 3 Shots of liquor per week   Drug use: Not Currently    Types: Marijuana, Cocaine    Comment: Cocaine June 2024, Marijauna April 2024    Allergies: No Known Allergies  Meds:  No medications prior to admission.    ROS:  Review of Systems  Constitutional:  Negative for chills, fatigue and fever.  Respiratory:  Negative for shortness of breath.   Cardiovascular:  Positive for chest pain.  Gastrointestinal:  Positive for abdominal pain.  Genitourinary:  Negative for difficulty urinating, dysuria, flank pain, pelvic pain, vaginal bleeding, vaginal discharge and vaginal pain.  Musculoskeletal:  Positive for myalgias.  Neurological:  Negative for dizziness and headaches.  Psychiatric/Behavioral: Negative.       I have reviewed patient's Past Medical Hx, Surgical Hx, Family Hx, Social Hx, medications and allergies.   Physical Exam  Patient Vitals for the past 24 hrs:  BP Temp Temp src Pulse Resp SpO2 Height Weight  08/18/22 2355 120/78 -- --  85 -- -- -- --  08/18/22 1935 110/70 98.3 F (36.8 C) Oral 94 16 99 % 5\' 5"  (1.651 m) 108.2 kg   Constitutional: Well-developed, well-nourished female in no acute distress.  HEART: RRR, no murmurs rubs/gallops RESP: clear and equal to auscultation bilaterally in all lobes  Breast: Right breast wnl, with some smooth round and ropelike mobile masses palpable in upper outer quadrant, no skin or nipple changes GI: Abd soft, non-tender, gravid appropriate for gestational age.  MS: Extremities nontender, no edema, normal ROM Neurologic: Alert and oriented x 4.  GU: Neg CVAT.      FHT:  152 by doppler   Labs: Results for orders placed or performed during the hospital encounter of 08/18/22 (from the past 24 hour(s))  CBC     Status: Abnormal   Collection Time: 08/18/22  9:48 PM  Result Value Ref Range   WBC 13.3 (H) 4.0 - 10.5 K/uL   RBC 4.18 3.87 - 5.11  MIL/uL   Hemoglobin 11.3 (L) 12.0 - 15.0 g/dL   HCT 57.8 (L) 46.9 - 62.9 %   MCV 82.1 80.0 - 100.0 fL   MCH 27.0 26.0 - 34.0 pg   MCHC 32.9 30.0 - 36.0 g/dL   RDW 52.8 41.3 - 24.4 %   Platelets 284 150 - 400 K/uL   nRBC 0.0 0.0 - 0.2 %  Comprehensive metabolic panel     Status: Abnormal   Collection Time: 08/18/22  9:48 PM  Result Value Ref Range   Sodium 136 135 - 145 mmol/L   Potassium 4.0 3.5 - 5.1 mmol/L   Chloride 102 98 - 111 mmol/L   CO2 24 22 - 32 mmol/L   Glucose, Bld 90 70 - 99 mg/dL   BUN <5 (L) 6 - 20 mg/dL   Creatinine, Ser 0.10 0.44 - 1.00 mg/dL   Calcium 9.3 8.9 - 27.2 mg/dL   Total Protein 5.5 (L) 6.5 - 8.1 g/dL   Albumin 2.7 (L) 3.5 - 5.0 g/dL   AST 12 (L) 15 - 41 U/L   ALT 10 0 - 44 U/L   Alkaline Phosphatase 42 38 - 126 U/L   Total Bilirubin 0.3 0.3 - 1.2 mg/dL   GFR, Estimated >53 >66 mL/min   Anion gap 10 5 - 15  Lipase, blood     Status: None   Collection Time: 08/18/22  9:48 PM  Result Value Ref Range   Lipase 24 11 - 51 U/L  Troponin I (High Sensitivity)     Status: None   Collection Time: 08/18/22  9:48 PM  Result Value Ref Range   Troponin I (High Sensitivity) <2 <18 ng/L   A/Positive/-- (07/31 1504)  Imaging:  US Abdomen Limited RUQ (LIVER/GB)  Result Date: 08/18/2022 CLINICAL DATA:  Right upper quadrant pain EXAM: ULTRASOUND ABDOMEN LIMITED RIGHT UPPER QUADRANT COMPARISON:  MRI 08/26/2017, ultrasound 08/25/2017 FINDINGS: Gallbladder: No gallstones or wall thickening visualized. No sonographic Murphy sign noted by sonographer. Common bile duct: Diameter: 2.6 mm Liver: No focal lesion identified. Within normal limits in parenchymal echogenicity. Portal vein is patent on color Doppler imaging with normal direction of blood flow towards the liver. Other: None. IMPRESSION: Negative examination. Electronically Signed   By: Jasmine Pang M.D.   On: 08/18/2022 23:03    MAU Course/MDM: Orders Placed This Encounter  Procedures   US Abdomen Limited RUQ  (LIVER/GB)   [place] LIMITED ULTRASOUND INCLUDING AXILLA RIGHT BREAST   CBC   Comprehensive metabolic panel  Lipase, blood   ED EKG   Discharge patient    Meds ordered this encounter  Medications   cyclobenzaprine (FLEXERIL) 10 MG tablet    Sig: Take 1 tablet (10 mg total) by mouth 2 (two) times daily as needed for muscle spasms.    Dispense:  20 tablet    Refill:  0    Order Specific Question:   Supervising Provider    Answer:   Milas Hock [[id number]]   cyclobenzaprine (FLEXERIL) tablet 10 mg     FHT wnl No lower abdominal pain or cramping, pain is upper abdomen/chest/breast EKG and troponin wnl, no cardiac causes for pain found No SOB or tachycardia, and symptoms have occurred before, weeks ago, so unlikely PE Lipase wnl, so unlikely pancreatitis RUQ normal, so no evidence of cholecystitis Most likely MSK pain from coughing Breast exam with some fibrous findings/fibrocystic breasts so outpatient [place] ordered given pt pain/concerns Flexeril 10 mg dose given in MAU and Rx sent Return precautions reviewed Keep scheduled appts in the office  Assessment: 1. Chest pain, unspecified type   2. Colicky right upper quadrant pain   3. [redacted] weeks gestation of pregnancy   4. Upper back pain on right side   5. Pain of right breast   6. Fibrous breast lumps   7. Musculoskeletal pain     Plan: Discharge home Labor precautions and fetal kick counts  Follow-up Information     [facility] for [facility] Follow up.   Specialty: Obstetrics and Gynecology Why: As scheduled Contact information: [address] [place] [address] [phone]        [facility] Follow up.   Specialty: Obstetrics and Gynecology Why: As needed for emergencies Contact information: [address] [place] [address] [phone]               Allergies as of 08/19/2022   No Known Allergies       Medication List     TAKE these medications    albuterol 108 (90 Base) MCG/ACT inhaler Commonly known as: VENTOLIN HFA Inhale 2 puffs into the lungs every 6 (six) hours as needed for wheezing or shortness of breath.   aspirin EC 81 MG tablet Take 1 tablet (81 mg total) by mouth daily.   cetirizine 10 MG tablet Commonly known as: ZYRTEC Take 10 mg by mouth as needed for allergies.   cyclobenzaprine 10 MG tablet Commonly known as: FLEXERIL Take 1 tablet (10 mg total) by mouth 2 (two) times daily as needed for muscle spasms.   famotidine 20 MG tablet Commonly known as: PEPCID Take 1 tablet (20 mg total) by mouth daily.   metroNIDAZOLE 500 MG tablet Commonly known as: FLAGYL Take 1 tablet (500 mg total) by mouth 2 (two) times daily.   miconazole 2 % vaginal cream Commonly known as: MONISTAT 7 Place 1 Applicatorful vaginally at bedtime. Apply for seven nights   PrePLUS 27-1 MG Tabs Take 1 tablet by mouth daily.   terconazole 0.4 % vaginal cream Commonly known as: TERAZOL 7 Place 1 applicator vaginally at bedtime. Use for seven days        Sharen Counter Certified Nurse-Midwife 08/19/2022 4:57 AM

## 2022-08-18 NOTE — MAU Note (Signed)
Pt says Wed - having -Chest pain - that goes from her right chest toward her back-  AND  Pain in her right breast . 4-5/10 Now- 9/10 (We were interrupted by pt had to run baby bottle to car ) Pt nasal congestion , hurts to breathe sometimes,  sometimes a cough , no vomiting . Fells baby move .  Has happened before - few weeks ago - had pain in her right breast

## 2022-08-21 ENCOUNTER — Ambulatory Visit: Payer: Medicaid Other

## 2022-08-21 ENCOUNTER — Ambulatory Visit: Payer: Medicaid Other | Attending: Obstetrics and Gynecology

## 2022-08-21 ENCOUNTER — Other Ambulatory Visit: Payer: Self-pay | Admitting: *Deleted

## 2022-08-21 DIAGNOSIS — E669 Obesity, unspecified: Secondary | ICD-10-CM

## 2022-08-21 DIAGNOSIS — J45909 Unspecified asthma, uncomplicated: Secondary | ICD-10-CM

## 2022-08-21 DIAGNOSIS — F1910 Other psychoactive substance abuse, uncomplicated: Secondary | ICD-10-CM

## 2022-08-21 DIAGNOSIS — O0990 Supervision of high risk pregnancy, unspecified, unspecified trimester: Secondary | ICD-10-CM | POA: Insufficient documentation

## 2022-08-21 DIAGNOSIS — O09292 Supervision of pregnancy with other poor reproductive or obstetric history, second trimester: Secondary | ICD-10-CM

## 2022-08-21 DIAGNOSIS — Z3A20 20 weeks gestation of pregnancy: Secondary | ICD-10-CM

## 2022-08-21 DIAGNOSIS — O99212 Obesity complicating pregnancy, second trimester: Secondary | ICD-10-CM

## 2022-08-21 DIAGNOSIS — O99512 Diseases of the respiratory system complicating pregnancy, second trimester: Secondary | ICD-10-CM

## 2022-08-21 DIAGNOSIS — Z362 Encounter for other antenatal screening follow-up: Secondary | ICD-10-CM

## 2022-08-21 DIAGNOSIS — O99322 Drug use complicating pregnancy, second trimester: Secondary | ICD-10-CM | POA: Diagnosis not present

## 2022-08-21 DIAGNOSIS — Z8632 Personal history of gestational diabetes: Secondary | ICD-10-CM

## 2022-08-30 ENCOUNTER — Encounter: Payer: Self-pay | Admitting: Family Medicine

## 2022-08-30 ENCOUNTER — Encounter: Payer: Self-pay | Admitting: *Deleted

## 2022-08-30 ENCOUNTER — Ambulatory Visit (INDEPENDENT_AMBULATORY_CARE_PROVIDER_SITE_OTHER): Payer: Medicaid Other | Admitting: Family Medicine

## 2022-08-30 ENCOUNTER — Other Ambulatory Visit: Payer: Self-pay

## 2022-08-30 VITALS — BP 116/76 | HR 74 | Wt 240.0 lb

## 2022-08-30 DIAGNOSIS — Z3A22 22 weeks gestation of pregnancy: Secondary | ICD-10-CM

## 2022-08-30 DIAGNOSIS — O09892 Supervision of other high risk pregnancies, second trimester: Secondary | ICD-10-CM

## 2022-08-30 DIAGNOSIS — O0930 Supervision of pregnancy with insufficient antenatal care, unspecified trimester: Secondary | ICD-10-CM | POA: Insufficient documentation

## 2022-08-30 DIAGNOSIS — Z8632 Personal history of gestational diabetes: Secondary | ICD-10-CM

## 2022-08-30 DIAGNOSIS — O0992 Supervision of high risk pregnancy, unspecified, second trimester: Secondary | ICD-10-CM | POA: Diagnosis not present

## 2022-08-30 DIAGNOSIS — Z87898 Personal history of other specified conditions: Secondary | ICD-10-CM

## 2022-08-30 DIAGNOSIS — O0932 Supervision of pregnancy with insufficient antenatal care, second trimester: Secondary | ICD-10-CM

## 2022-08-30 DIAGNOSIS — O99212 Obesity complicating pregnancy, second trimester: Secondary | ICD-10-CM

## 2022-08-30 DIAGNOSIS — O0990 Supervision of high risk pregnancy, unspecified, unspecified trimester: Secondary | ICD-10-CM

## 2022-08-30 NOTE — Progress Notes (Signed)
   PRENATAL VISIT NOTE  Subjective:  Tamara Bryant is a 32 y.o. G3P2002 at [redacted]w[redacted]d being seen today for ongoing prenatal care.  She is currently monitored for the following issues for this high-risk pregnancy and has BMI 39.0-39.9,adult; Mild intermittent asthma; High blood hemoglobin F (HCC); Supervision of high risk pregnancy, antepartum; History of substance use; Short interval between pregnancies affecting pregnancy in second trimester, antepartum; History of gestational diabetes mellitus (GDM); Obesity affecting pregnancy in second trimester; and Late prenatal care affecting pregnancy, antepartum on their problem list.  Patient reports no complaints.  Contractions: Not present. Vag. Bleeding: None.  Movement: Present. Denies leaking of fluid.   The following portions of the patient's history were reviewed and updated as appropriate: allergies, current medications, past family history, past medical history, past social history, past surgical history and problem list.   Objective:   Vitals:   08/30/22 1531  BP: 116/76  Pulse: 74  Weight: 240 lb (108.9 kg)    Fetal Status: Fetal Heart Rate (bpm): 155 Fundal Height: 23 cm Movement: Present     General:  Alert, oriented and cooperative. Patient is in no acute distress.  Skin: Skin is warm and dry. No rash noted.   Cardiovascular: Normal heart rate noted  Respiratory: Normal respiratory effort, no problems with respiration noted  Abdomen: Soft, gravid, appropriate for gestational age.  Pain/Pressure: Present     Pelvic: Cervical exam deferred        Extremities: Normal range of motion.  Edema: None  Mental Status: Normal mood and affect. Normal behavior. Normal judgment and thought content.   Assessment and Plan:  Pregnancy: G3P2002 at [redacted]w[redacted]d 1. Short interval between pregnancies affecting pregnancy in second trimester, antepartum Last delivery 1 year ago  2. Supervision of high risk pregnancy, antepartum UTD No concerns AFP today   Considering postplacental IUD-- thinks she does want this since this pregnancy was unexpected  3. Obesity affecting pregnancy in second trimester, unspecified obesity type TWG=10 lb (4.536 kg)   4. History of substance use Cocaine in June 2024, none since per report today.   5. History of gestational diabetes mellitus (GDM) HA1C negative in early pregnancy  Preterm labor symptoms and general obstetric precautions including but not limited to vaginal bleeding, contractions, leaking of fluid and fetal movement were reviewed in detail with the patient. Please refer to After Visit Summary for other counseling recommendations.   No follow-ups on file.  Future Appointments  Date Time Provider Department Center  09/19/2022  1:30 PM [facility]  09/27/2022  2:15 PM Lorriane Shire, MD [facility]    Federico Flake, MD

## 2022-09-01 LAB — AFP, SERUM, OPEN SPINA BIFIDA
AFP MoM: 0.91
AFP Value: 55 ng/mL
Gest. Age on Collection Date: 22 wk
Maternal Age At EDD: 30.1 a
OSBR Risk 1 IN: 10000
Test Results:: NEGATIVE
Weight: 240 [lb_av]

## 2022-09-14 ENCOUNTER — Ambulatory Visit
Admission: RE | Admit: 2022-09-14 | Discharge: 2022-09-14 | Disposition: A | Discharge status: Home / Self Care | Payer: Medicaid Other | Source: Ambulatory Visit | Attending: Advanced Practice Midwife | Admitting: Advanced Practice Midwife

## 2022-09-14 DIAGNOSIS — N644 Mastodynia: Secondary | ICD-10-CM

## 2022-09-14 DIAGNOSIS — N63 Unspecified lump in unspecified breast: Secondary | ICD-10-CM

## 2022-09-19 ENCOUNTER — Ambulatory Visit: Payer: Medicaid Other | Attending: Obstetrics and Gynecology

## 2022-09-19 ENCOUNTER — Other Ambulatory Visit: Payer: Self-pay | Admitting: *Deleted

## 2022-09-19 DIAGNOSIS — E669 Obesity, unspecified: Secondary | ICD-10-CM

## 2022-09-19 DIAGNOSIS — O09292 Supervision of pregnancy with other poor reproductive or obstetric history, second trimester: Secondary | ICD-10-CM

## 2022-09-19 DIAGNOSIS — J45909 Unspecified asthma, uncomplicated: Secondary | ICD-10-CM | POA: Diagnosis not present

## 2022-09-19 DIAGNOSIS — Z3A24 24 weeks gestation of pregnancy: Secondary | ICD-10-CM

## 2022-09-19 DIAGNOSIS — O99212 Obesity complicating pregnancy, second trimester: Secondary | ICD-10-CM

## 2022-09-19 DIAGNOSIS — O99213 Obesity complicating pregnancy, third trimester: Secondary | ICD-10-CM

## 2022-09-19 DIAGNOSIS — Z362 Encounter for other antenatal screening follow-up: Secondary | ICD-10-CM | POA: Insufficient documentation

## 2022-09-19 DIAGNOSIS — O09893 Supervision of other high risk pregnancies, third trimester: Secondary | ICD-10-CM

## 2022-09-19 DIAGNOSIS — O99512 Diseases of the respiratory system complicating pregnancy, second trimester: Secondary | ICD-10-CM | POA: Diagnosis not present

## 2022-09-27 ENCOUNTER — Other Ambulatory Visit: Payer: Self-pay

## 2022-09-27 ENCOUNTER — Ambulatory Visit (INDEPENDENT_AMBULATORY_CARE_PROVIDER_SITE_OTHER): Payer: Medicaid Other | Admitting: Obstetrics & Gynecology

## 2022-09-27 ENCOUNTER — Encounter: Payer: Medicaid Other | Admitting: Obstetrics and Gynecology

## 2022-09-27 ENCOUNTER — Encounter: Payer: Self-pay | Admitting: Obstetrics & Gynecology

## 2022-09-27 VITALS — BP 112/73 | HR 98 | Wt 238.0 lb

## 2022-09-27 DIAGNOSIS — Z8632 Personal history of gestational diabetes: Secondary | ICD-10-CM

## 2022-09-27 DIAGNOSIS — O0992 Supervision of high risk pregnancy, unspecified, second trimester: Secondary | ICD-10-CM

## 2022-09-27 DIAGNOSIS — Z23 Encounter for immunization: Secondary | ICD-10-CM

## 2022-09-27 DIAGNOSIS — O0990 Supervision of high risk pregnancy, unspecified, unspecified trimester: Secondary | ICD-10-CM

## 2022-09-27 DIAGNOSIS — Z3A26 26 weeks gestation of pregnancy: Secondary | ICD-10-CM

## 2022-09-27 NOTE — Patient Instructions (Addendum)
Return to office for any scheduled appointments. Call the office or go to the MAU at [facility] at [facility] if: You begin to have strong, frequent contractions Your water breaks.  Sometimes it is a big gush of fluid, sometimes it is just a trickle that keeps getting your underwear wet or running down your legs You have vaginal bleeding.  It is normal to have a small amount of spotting if your cervix was checked.  You do not feel your baby moving like normal.  If you do not, get something to eat and drink and lay down and focus on feeling your baby move.   If your baby is still not moving like normal, you should call the office or go to MAU. Any other obstetric concerns.   TDaP Vaccine Pregnancy Get the Whooping Cough Vaccine While You Are Pregnant (CDC)  It is important for women to get the whooping cough vaccine in the third trimester of each pregnancy. Vaccines are the best way to prevent this disease. There are 2 different whooping cough vaccines. Both vaccines combine protection against whooping cough, tetanus and diphtheria, but they are for different age groups: Tdap: for everyone 11 years or older, including pregnant women  DTaP: for children 2 months through 71 years of age  You need the whooping cough vaccine during each of your pregnancies The recommended time to get the shot is during your 27th through 36th week of pregnancy, preferably during the earlier part of this time period. The Centers for Disease Control and Prevention (CDC) recommends that pregnant women receive the whooping cough vaccine for adolescents and adults (called Tdap vaccine) during the third trimester of each pregnancy. The recommended time to get the shot is during your 27th through 36th week of pregnancy, preferably during the earlier part of this time period. This replaces the original recommendation that pregnant women get the vaccine only if they had not previously received it. The [employer] and the [employer] support this recommendation.  You should get the whooping cough vaccine while pregnant to pass protection to your baby frame support disabled and/or not supported in this browser  Learn why Vernona Rieger decided to get the whooping cough vaccine in her 3rd trimester of pregnancy and how her baby girl was born with some protection against the disease. Also available on YouTube. After receiving the whooping cough vaccine, your body will create protective antibodies (proteins produced by the body to fight off diseases) and pass some of them to your baby before birth. These antibodies provide your baby some short-term protection against whooping cough in early life. These antibodies can also protect your baby from some of the more serious complications that come along with whooping cough. Your protective antibodies are at their highest about 2 weeks after getting the vaccine, but it takes time to pass them to your baby. So the preferred time to get the whooping cough vaccine is early in your third trimester. The amount of whooping cough antibodies in your body decreases over time. That is why CDC recommends you get a whooping cough vaccine during each pregnancy. Doing so allows each of your babies to get the greatest number of protective antibodies from you. This means each of your babies will get the best protection possible against this disease.  Getting the whooping cough vaccine while pregnant is better than getting the vaccine after you give birth Whooping cough vaccination during pregnancy is ideal so  your baby will have short-term protection as soon as he is born. This early protection is important because your baby will not start getting his whooping cough vaccines until he is 2 months old. These first few months of life are when your baby is at greatest risk for catching whooping cough. This is also when he's at greatest  risk for having severe, potentially life-threating complications from the infection. To avoid that gap in protection, it is best to get a whooping cough vaccine during pregnancy. You will then pass protection to your baby before he is born. To continue protecting your baby, he should get whooping cough vaccines starting at 2 months old. You may never have gotten the Tdap vaccine before and did not get it during this pregnancy. If so, you should make sure to get the vaccine immediately after you give birth, before leaving the hospital or birthing center. It will take about 2 weeks before your body develops protection (antibodies) in response to the vaccine. Once you have protection from the vaccine, you are less likely to give whooping cough to your newborn while caring for him. But remember, your baby will still be at risk for catching whooping cough from others. A recent study looked to see how effective Tdap was at preventing whooping cough in babies whose mothers got the vaccine while pregnant or in the hospital after giving birth. The study found that getting Tdap between 27 through 36 weeks of pregnancy is 85% more effective at preventing whooping cough in babies younger than 2 months old. Blood tests cannot tell if you need a whooping cough vaccine There are no blood tests that can tell you if you have enough antibodies in your body to protect yourself or your baby against whooping cough. Even if you have been sick with whooping cough in the past or previously received the vaccine, you still should get the vaccine during each pregnancy. Breastfeeding may pass some protective antibodies onto your baby By breastfeeding, you may pass some antibodies you have made in response to the vaccine to your baby. When you get a whooping cough vaccine during your pregnancy, you will have antibodies in your breast milk that you can share with your baby as soon as your milk comes in. However, your baby will not get  protective antibodies immediately if you wait to get the whooping cough vaccine until after delivering your baby. This is because it takes about 2 weeks for your body to create antibodies. Learn more about the health benefits of breastfeeding.

## 2022-09-27 NOTE — Progress Notes (Signed)
PRENATAL VISIT NOTE  Subjective:  Tamara Bryant is a 32 y.o. G3P2002 at [redacted]w[redacted]d being seen today for ongoing prenatal care.  She is currently monitored for the following issues for this high-risk pregnancy and has BMI 39.0-39.9,adult; Mild intermittent asthma; High blood hemoglobin F (HCC); Supervision of high risk pregnancy, antepartum; History of substance use; Short interval between pregnancies affecting pregnancy in second trimester, antepartum; History of gestational diabetes mellitus (GDM); Obesity affecting pregnancy in second trimester; and Late prenatal care affecting pregnancy, antepartum on their problem list.  Patient reports no complaints.  Contractions: Irritability. Vag. Bleeding: None.  Movement: Present. Denies leaking of fluid.   The following portions of the patient's history were reviewed and updated as appropriate: allergies, current medications, past family history, past medical history, past social history, past surgical history and problem list.   Objective:   Vitals:   09/27/22 1318  BP: 112/73  Pulse: 98  Weight: 238 lb (108 kg)    Fetal Status: Fetal Heart Rate (bpm): 150   Movement: Present     General:  Alert, oriented and cooperative. Patient is in no acute distress.  Skin: Skin is warm and dry. No rash noted.   Cardiovascular: Normal heart rate noted  Respiratory: Normal respiratory effort, no problems with respiration noted  Abdomen: Soft, gravid, appropriate for gestational age.  Pain/Pressure: Present     Pelvic: Cervical exam deferred        Extremities: Normal range of motion.  Edema: Trace  Mental Status: Normal mood and affect. Normal behavior. Normal judgment and thought content.   [place] MFM OB FOLLOW UP  Result Date: 09/19/2022 ----------------------------------------------------------------------  OBSTETRICS REPORT                       (Signed Final 09/19/2022 02:21 pm) ----------------------------------------------------------------------  Patient Info  ID #:       [id number]                          D.O.B.:  October 22, 1990 (31 yrs)  Name:       Tamara Bryant                 Visit Date: 09/19/2022 01:38 pm ---------------------------------------------------------------------- Performed By  Attending:        Noralee Space MD        Ref. Address:     [address]                                                             [place], [place]                                                             [address]  Performed By:     Reinaldo Raddle            [facility]  Referred By:      [place] Bing MD ---------------------------------------------------------------------- Orders  #  Description                           Code        Ordered By  1  [place] MFM OB FOLLOW UP                   41660.63    Noralee Space ----------------------------------------------------------------------  #  Order #                     Accession #                Episode #  1  [id number]                   [id number]                 [id number] ---------------------------------------------------------------------- Indications  Obesity complicating pregnancy, second         O99.212  trimester (BMI 39)  Short interval between pregancies, 2nd         O09.892  trimester (06/2021)  Asthma                                         O99.89 j45.909  Poor obstetric history: Previous gestational   O09.299  diabetes  [redacted] weeks gestation of pregnancy                Z3A.24  LR NIPS/Neg Horizon/Neg AFP ---------------------------------------------------------------------- Fetal Evaluation  Num Of Fetuses:         1  Fetal Heart Rate(bpm):  160  Cardiac Activity:       Observed  Presentation:           Cephalic  Placenta:               Anterior  P. Cord Insertion:       Visualized, central  Amniotic Fluid  AFI FV:      Within normal limits                              Largest Pocket(cm)                              3.6 ---------------------------------------------------------------------- Biometry  BPD:      60.5  mm  G. Age:  24w 4d         34  %    CI:        73.74   %    70 - 86                                                          FL/HC:      20.8   %    18.7 - 20.3  HC:      223.8  mm     G. Age:  24w 3d         16  %    HC/AC:      1.14        1.04 - 1.22  AC:      197.1  mm     G. Age:  24w 3d         26  %    FL/BPD:     76.9   %    71 - 87  FL:       46.5  mm     G. Age:  25w 3d         56  %    FL/AC:      23.6   %    20 - 24  HUM:        46  mm     G. Age:  27w 1d       > 95  %  LV:        3.1  mm  Est. FW:     736  gm    1 lb 10 oz      37  % ---------------------------------------------------------------------- OB History  Blood Type:   A+  Maternal Racial/Ethnic Group:   Black (non-Hispanic)  Gravidity:    3         Term:   2        Prem:   0        SAB:   0  TOP:          0       Ectopic:  0        Living: 2 ---------------------------------------------------------------------- Gestational Age  LMP:           24w 6d        Date:  03/29/22                  EDD:   01/03/23  U/S Today:     24w 5d                                        EDD:   01/04/23  Best:          24w 6d     Det. By:  LMP  (03/29/22)          EDD:   01/03/23 ---------------------------------------------------------------------- Anatomy  Cranium:               Appears normal         LVOT:                   Previously seen  Cavum:  Appears normal         Aortic Arch:            Appears normal  Ventricles:            Appears normal         Ductal Arch:            Appears normal  Choroid Plexus:        Previously seen        Diaphragm:              Appears normal  Cerebellum:            Previously seen        Stomach:                Appears normal, left                                                                         sided  Posterior Fossa:       Previously seen        Abdomen:                Appears normal  Nuchal Fold:           Not applicable (>20    Abdominal Wall:         Previously seen                         wks GA)  Face:                  Profile nl; orbits     Cord Vessels:           Previously seen                         prev visualized  Lips:                  Previously seen        Kidneys:                Appear normal  Palate:                Not well visualized    Bladder:                Appears normal  Thoracic:              Appears normal         Spine:                  Previously seen  Heart:                 Appears normal         Upper Extremities:      Previously seen                         (4CH, axis, and                         situs)  RVOT:  Previously seen        Lower Extremities:      Previously seen  Other:  Fetus appears to be female. Nasal bone, Lenses, Lt hand, feet and Lt          heel prev visualized. Technically difficult due to maternal habitus and          fetal position. IVC/SVC, 3VV, 3VTV prev vis. Rt heel, 5th vis ---------------------------------------------------------------------- Cervix Uterus Adnexa  Cervix  Not visualized (advanced GA >24wks)  Uterus  No abnormality visualized.  Right Ovary  Size(cm)     2.04   x   1.94   x  1.6       Vol(ml): 3.32  Within normal limits.  Left Ovary  Size(cm)     3.49   x   1.92   x  1.56      Vol(ml): 5.47  Within normal limits.  Cul De Sac  No free fluid seen.  Adnexa  No adnexal mass visualized ---------------------------------------------------------------------- Impression  Patient returned for completion of fetal anatomy .Amniotic  fluid is normal and good fetal activity is seen .Fetal biometry  is consistent with her previously-established dates .Fetal  anatomical survey was completed and appears normal. ---------------------------------------------------------------------- Recommendations  -An  appointment was made for her to return in 8 weeks for  fetal growth assessment. ----------------------------------------------------------------------                 Noralee Space, MD Electronically Signed Final Report   09/19/2022 02:21 pm ----------------------------------------------------------------------   [place] LIMITED ULTRASOUND INCLUDING AXILLA RIGHT BREAST  Result Date: 09/14/2022 CLINICAL DATA:  32 year old female currently 6 months pregnant presents with palpable areas of concern in the upper-outer right breast felt by her provider as well as chronic tenderness involving predominantly the upper inner right breast. EXAM: ULTRASOUND OF THE RIGHT BREAST COMPARISON:  None available. FINDINGS: Targeted ultrasound of the right breast was performed. No suspicious masses or any other worrisome abnormality seen, only normal-appearing fibroglandular tissue identified in the regions of concern in the right breast. IMPRESSION: No sonographic abnormalities at the sites of palpable concern and tenderness in the right breast. RECOMMENDATION: 1. Recommend further management of the right breast palpable areas of concern in tenderness be based on clinical assessment. 2. Screening mammogram at age 3 unless there are persistent or intervening clinical concerns. (Code:SM-B-40A) I have discussed the findings and recommendations with the patient. If applicable, a reminder letter will be sent to the patient regarding the next appointment. BI-RADS CATEGORY  1: Negative. Electronically Signed   By: Edwin Cap M.D.   On: 09/14/2022 13:23    Assessment and Plan:  Pregnancy: G3P2002 at [redacted]w[redacted]d 1. History of gestational diabetes mellitus (GDM) GTT and other labs next visit.  2. [redacted] weeks gestation of pregnancy 3. Supervision of high risk pregnancy, antepartum - Flu vaccine trivalent PF, 6mos and older(Flulaval,Afluria,Fluarix,Fluzone) given today. Tdap information given to patient. Preterm labor symptoms and general  obstetric precautions including but not limited to vaginal bleeding, contractions, leaking of fluid and fetal movement were reviewed in detail with the patient. Please refer to After Visit Summary for other counseling recommendations.   Return in about 2 weeks (around 10/11/2022) for 2 hr GTT, 3rd trimester labs, TDap, OFFICE OB VISIT (MD or APP).  Future Appointments  Date Time Provider Department Center  10/16/2022  1:15 PM Sue Lush, FNP [facility]  11/14/2022  1:30 PM [facility] US1 [facility]    Jaynie Collins, MD

## 2022-10-07 ENCOUNTER — Other Ambulatory Visit: Payer: Self-pay

## 2022-10-07 ENCOUNTER — Inpatient Hospital Stay (HOSPITAL_COMMUNITY): Payer: Medicaid Other

## 2022-10-07 ENCOUNTER — Inpatient Hospital Stay (HOSPITAL_COMMUNITY)
Admission: AD | Admit: 2022-10-07 | Discharge: 2022-10-07 | Disposition: A | Discharge status: Home / Self Care | Payer: Medicaid Other | Attending: Obstetrics & Gynecology | Admitting: Obstetrics & Gynecology

## 2022-10-07 ENCOUNTER — Encounter (HOSPITAL_COMMUNITY): Payer: Self-pay | Admitting: Obstetrics & Gynecology

## 2022-10-07 DIAGNOSIS — Z3A27 27 weeks gestation of pregnancy: Secondary | ICD-10-CM | POA: Insufficient documentation

## 2022-10-07 DIAGNOSIS — G43509 Persistent migraine aura without cerebral infarction, not intractable, without status migrainosus: Secondary | ICD-10-CM | POA: Diagnosis not present

## 2022-10-07 DIAGNOSIS — G43009 Migraine without aura, not intractable, without status migrainosus: Secondary | ICD-10-CM | POA: Diagnosis present

## 2022-10-07 DIAGNOSIS — O9A212 Injury, poisoning and certain other consequences of external causes complicating pregnancy, second trimester: Secondary | ICD-10-CM | POA: Insufficient documentation

## 2022-10-07 DIAGNOSIS — T50B95A Adverse effect of other viral vaccines, initial encounter: Secondary | ICD-10-CM | POA: Insufficient documentation

## 2022-10-07 DIAGNOSIS — G43809 Other migraine, not intractable, without status migrainosus: Secondary | ICD-10-CM | POA: Insufficient documentation

## 2022-10-07 DIAGNOSIS — R42 Dizziness and giddiness: Secondary | ICD-10-CM | POA: Diagnosis present

## 2022-10-07 LAB — CBC WITH DIFFERENTIAL/PLATELET
Abs Immature Granulocytes: 0.04 10*3/uL (ref 0.00–0.07)
Basophils Absolute: 0 10*3/uL (ref 0.0–0.1)
Basophils Relative: 0 %
Eosinophils Absolute: 0.2 10*3/uL (ref 0.0–0.5)
Eosinophils Relative: 2 %
HCT: 32.3 % — ABNORMAL LOW (ref 36.0–46.0)
Hemoglobin: 10.6 g/dL — ABNORMAL LOW (ref 12.0–15.0)
Immature Granulocytes: 0 %
Lymphocytes Relative: 27 %
Lymphs Abs: 3 10*3/uL (ref 0.7–4.0)
MCH: 28 pg (ref 26.0–34.0)
MCHC: 32.8 g/dL (ref 30.0–36.0)
MCV: 85.4 fL (ref 80.0–100.0)
Monocytes Absolute: 0.6 10*3/uL (ref 0.1–1.0)
Monocytes Relative: 6 %
Neutro Abs: 7.1 10*3/uL (ref 1.7–7.7)
Neutrophils Relative %: 65 %
Platelets: 282 10*3/uL (ref 150–400)
RBC: 3.78 MIL/uL — ABNORMAL LOW (ref 3.87–5.11)
RDW: 15.1 % (ref 11.5–15.5)
WBC: 11 10*3/uL — ABNORMAL HIGH (ref 4.0–10.5)
nRBC: 0 % (ref 0.0–0.2)

## 2022-10-07 LAB — COMPREHENSIVE METABOLIC PANEL
ALT: 10 U/L (ref 0–44)
AST: 11 U/L — ABNORMAL LOW (ref 15–41)
Albumin: 2.6 g/dL — ABNORMAL LOW (ref 3.5–5.0)
Alkaline Phosphatase: 49 U/L (ref 38–126)
Anion gap: 8 (ref 5–15)
BUN: 8 mg/dL (ref 6–20)
CO2: 20 mmol/L — ABNORMAL LOW (ref 22–32)
Calcium: 8.9 mg/dL (ref 8.9–10.3)
Chloride: 107 mmol/L (ref 98–111)
Creatinine, Ser: 0.52 mg/dL (ref 0.44–1.00)
GFR, Estimated: >60 mL/min (ref >60)
Glucose, Bld: 91 mg/dL (ref 70–99)
Potassium: 3.6 mmol/L (ref 3.5–5.1)
Sodium: 135 mmol/L (ref 135–145)
Total Bilirubin: 0.4 mg/dL (ref 0.3–1.2)
Total Protein: 5.5 g/dL — ABNORMAL LOW (ref 6.5–8.1)

## 2022-10-07 LAB — URINALYSIS, ROUTINE W REFLEX MICROSCOPIC
Bilirubin Urine: NEGATIVE
Glucose, UA: NEGATIVE mg/dL
Hgb urine dipstick: NEGATIVE
Ketones, ur: 5 mg/dL — AB
Leukocytes,Ua: NEGATIVE
Nitrite: NEGATIVE
Protein, ur: NEGATIVE mg/dL
Specific Gravity, Urine: 1.015 (ref 1.005–1.030)
pH: 6 (ref 5.0–8.0)

## 2022-10-07 LAB — PROTIME-INR
INR: 1 (ref 0.8–1.2)
Prothrombin Time: 13 s (ref 11.4–15.2)

## 2022-10-07 LAB — APTT: aPTT: 26 s (ref 24–36)

## 2022-10-07 LAB — PROTEIN / CREATININE RATIO, URINE
Creatinine, Urine: 105 mg/dL
Protein Creatinine Ratio: 0.16 mg/mg{creat} — ABNORMAL HIGH (ref 0.00–0.15)
Total Protein, Urine: 17 mg/dL

## 2022-10-07 MED ORDER — METOCLOPRAMIDE HCL 10 MG PO TABS: 10.0000 mg | ORAL_TABLET | Freq: Three times a day (TID) | ORAL | 0 refills | Status: DC | PRN

## 2022-10-07 MED ORDER — ACETAMINOPHEN-CAFFEINE 500-65 MG PO TABS: 1.0000 | ORAL_TABLET | Freq: Three times a day (TID) | ORAL | 0 refills | Status: DC | PRN

## 2022-10-07 MED ORDER — METOCLOPRAMIDE HCL 10 MG PO TABS
10.0000 mg | ORAL_TABLET | Freq: Once | ORAL | Status: AC
  Administered 2022-10-07: 10 mg via ORAL
  Filled 2022-10-07: qty 1

## 2022-10-07 MED ORDER — SODIUM CHLORIDE 0.9 % IV SOLN
500.0000 mg | Freq: Once | INTRAVENOUS | Status: AC
  Administered 2022-10-07: 500 mg via INTRAVENOUS
  Filled 2022-10-07: qty 2

## 2022-10-07 MED ORDER — METOCLOPRAMIDE HCL 10 MG PO TABS
10.0000 mg | ORAL_TABLET | Freq: Three times a day (TID) | ORAL | Status: DC | PRN
Start: 2022-10-07 — End: 2022-10-07

## 2022-10-07 MED ORDER — CYCLOBENZAPRINE HCL 5 MG PO TABS
10.0000 mg | ORAL_TABLET | Freq: Once | ORAL | Status: AC
  Administered 2022-10-07: 10 mg via ORAL
  Filled 2022-10-07: qty 2

## 2022-10-07 MED ORDER — ACETAMINOPHEN-CAFFEINE 500-65 MG PO TABS: 2.0000 | ORAL_TABLET | Freq: Once | ORAL | Status: DC

## 2022-10-07 MED ORDER — LACTATED RINGERS IV BOLUS
1000.0000 mL | Freq: Once | INTRAVENOUS | Status: AC
  Administered 2022-10-07: 1000 mL via INTRAVENOUS

## 2022-10-07 NOTE — MAU Provider Note (Signed)
EMS transport for HA, lightheadedness, L face numbness     S [name] is a 32 y.o. G66P2002 pregnant female at [redacted]w[redacted]d who presents to MAU today with complaint of HA, lightheadedness and L jaw pain / numbness.  Pt states that she had the jaw tenderness and numbness start 9/27 two days after receiving flu vaccine.  She says the migraine and lightheadedness started 9/30.  She states that since it wasn't getting better she decided to present today via EMS.  Denies CP, SOB, RUQ pain, palpitations, swelling.   Receives care at [facility]. Prenatal records reviewed.  Pertinent items noted in HPI and remainder of comprehensive ROS otherwise negative.   O BP 119/75   Pulse 83   Temp 97.8 F (36.6 C) (Oral)   Resp 18   LMP 03/29/2022   SpO2 100%  Physical Exam Vitals and nursing note reviewed.  Constitutional:      General: She is not in acute distress.    Appearance: Normal appearance. She is not ill-appearing.  HENT:     Head: Normocephalic and atraumatic.     Right Ear: External ear normal.     Left Ear: External ear normal.     Nose: Nose normal.     Mouth/Throat:     Mouth: Mucous membranes are moist.     Pharynx: Oropharynx is clear.     Comments: Patient states prior tooth abscess but no apparent abscess / erythema / drainage / swelling.  Eyes:     Conjunctiva/sclera: Conjunctivae normal.  Cardiovascular:     Rate and Rhythm: Normal rate.  Pulmonary:     Effort: Pulmonary effort is normal. No respiratory distress.  Musculoskeletal:        General: No swelling. Normal range of motion.     Cervical back: Normal range of motion.  Skin:    General: Skin is warm and dry.  Neurological:     Mental Status: She is alert and oriented to person, place, and time.     Sensory: Sensory deficit present.     Motor: No weakness.     Gait: Gait normal.     Comments: L face numbness, ?slur  Psychiatric:        Mood and Affect: Mood normal.        Behavior: Behavior normal.         Thought Content: Thought content normal.        Judgment: Judgment normal.      MDM: Moderate MAU Course:  Pt presenting with concerning neurological symptoms but given the last known normal being 9/30 did not call Code Stroke but did order Noncon Head CT. Head CT negative. Coags normal.   Pt believes jaw pain and slight numbness is coming from tooth pain, hx of tooth abscess on R side but feels similar to prior tooth abscess.  No apparent abscess on upper or lower L sided jaw.  Recommended following up with dentist as Bell's Palsy less likely given symmetric facial testing on neuro exam (though short interval pregnancy).   Pt states took 1g of Tylenol this morning w/o relief, ordered for flexeril, reglan, caffeine as well as 1L LR bolus. Pt states having some relief with medications today, HA still "comes and goes." Requesting to eat. After eating patient states all symptoms improving.  Stable for Discharge.   NST: 145bpm, moderate variability, 10x10 accels, no decelerations, uterine irritability but no ctxs, Cat 1 strip given gestational age  A&P: #[redacted] weeks gestation #Complex Migraine  -  sending with oral prescription of both Excedrin Tension and Reglan PRNs -can consider Decadron if recurrent   Discharge from MAU in stable condition with strict / usual precautions Follow up at [facility] as scheduled for ongoing prenatal care  Allergies as of 10/07/2022   No Known Allergies      Medication List     TAKE these medications    acetaminophen 500 MG tablet Commonly known as: TYLENOL Take 1,000 mg by mouth every 6 (six) hours as needed for moderate pain.   acetaminophen-caffeine 500-65 MG Tabs per tablet Commonly known as: EXCEDRIN TENSION HEADACHE Take 1 tablet by mouth every 8 (eight) hours as needed.   albuterol 108 (90 Base) MCG/ACT inhaler Commonly known as: VENTOLIN HFA Inhale 2 puffs into the lungs every 6 (six) hours as needed for wheezing or shortness of breath.    albuterol (2.5 MG/3ML) 0.083% nebulizer solution Commonly known as: PROVENTIL Take 2.5 mg by nebulization every 6 (six) hours as needed for shortness of breath.   aspirin EC 81 MG tablet Take 1 tablet (81 mg total) by mouth daily.   budesonide-formoterol 160-4.5 MCG/ACT inhaler Commonly known as: SYMBICORT Inhale 2 puffs into the lungs 2 (two) times daily.   cetirizine 10 MG tablet Commonly known as: ZYRTEC Take 10 mg by mouth as needed for allergies.   cyclobenzaprine 10 MG tablet Commonly known as: FLEXERIL Take 1 tablet (10 mg total) by mouth 2 (two) times daily as needed for muscle spasms.   famotidine 20 MG tablet Commonly known as: PEPCID Take 1 tablet (20 mg total) by mouth daily.   ibuprofen 600 MG tablet Commonly known as: ADVIL Take 600 mg by mouth every 6 (six) hours as needed for mild pain.   metoCLOPramide 10 MG tablet Commonly known as: REGLAN Take 1 tablet (10 mg total) by mouth every 8 (eight) hours as needed for nausea (Headache).   PrePLUS 27-1 MG Tabs Take 1 tablet by mouth daily.        [name], MD 10/07/2022 2:01 PM

## 2022-10-07 NOTE — MAU Note (Signed)
.  Tamara Bryant is a 32 y.o. at [redacted]w[redacted]d here in MAU reporting: L facial pain since 9/27 after receiving flu vaccine on 9/25. On 09/30-10/1 migraine and dizziness began. Pt. Has taken ibuprofen, tylenol and a resumed a previous amoxicillin prescription for R tooth abscess from April 2024 for the pain with no relief. Last dose of ibuprofen was 600 mg on 10/4 at 2245, Tylenol taken 10/5 at 0800, amoxicillin 10/5 at 0800. Baby is moving well, denies VB, LOF, visual changes, RUQ pain.   Onset of complaint: 09/27 Pain score: 5/10 Vitals:   10/07/22 1030  BP: 120/70  Pulse: 88  Resp: 18  Temp: 97.8 F (36.6 C)  SpO2: 99%     FHT:150s Lab orders placed from triage:   UA

## 2022-10-07 NOTE — Discharge Instructions (Signed)

## 2022-10-16 ENCOUNTER — Other Ambulatory Visit: Payer: Medicaid Other

## 2022-10-16 ENCOUNTER — Encounter: Payer: Medicaid Other | Admitting: Obstetrics and Gynecology

## 2022-10-16 ENCOUNTER — Ambulatory Visit (INDEPENDENT_AMBULATORY_CARE_PROVIDER_SITE_OTHER): Payer: Medicaid Other | Admitting: Obstetrics and Gynecology

## 2022-10-16 VITALS — BP 115/75 | HR 102 | Wt 249.0 lb

## 2022-10-16 DIAGNOSIS — O09892 Supervision of other high risk pregnancies, second trimester: Secondary | ICD-10-CM

## 2022-10-16 DIAGNOSIS — K0889 Other specified disorders of teeth and supporting structures: Secondary | ICD-10-CM

## 2022-10-16 DIAGNOSIS — Z3A28 28 weeks gestation of pregnancy: Secondary | ICD-10-CM

## 2022-10-16 DIAGNOSIS — O0990 Supervision of high risk pregnancy, unspecified, unspecified trimester: Secondary | ICD-10-CM

## 2022-10-16 DIAGNOSIS — Z8632 Personal history of gestational diabetes: Secondary | ICD-10-CM

## 2022-10-16 DIAGNOSIS — O0993 Supervision of high risk pregnancy, unspecified, third trimester: Secondary | ICD-10-CM

## 2022-10-16 DIAGNOSIS — O09893 Supervision of other high risk pregnancies, third trimester: Secondary | ICD-10-CM

## 2022-10-16 DIAGNOSIS — Z23 Encounter for immunization: Secondary | ICD-10-CM | POA: Diagnosis not present

## 2022-10-16 DIAGNOSIS — O99213 Obesity complicating pregnancy, third trimester: Secondary | ICD-10-CM

## 2022-10-16 DIAGNOSIS — O99212 Obesity complicating pregnancy, second trimester: Secondary | ICD-10-CM

## 2022-10-16 MED ORDER — CYCLOBENZAPRINE HCL 10 MG PO TABS: 10.0000 mg | ORAL_TABLET | Freq: Two times a day (BID) | ORAL | 0 refills | Status: DC | PRN

## 2022-10-16 NOTE — Progress Notes (Signed)
   PRENATAL VISIT NOTE  Subjective:  Tamara Bryant is a 32 y.o. G3P2002 at [redacted]w[redacted]d being seen today for ongoing prenatal care.  She is currently monitored for the following issues for this high-risk pregnancy and has BMI 39.0-39.9,adult; Mild intermittent asthma; High blood hemoglobin F (HCC); Supervision of high risk pregnancy, antepartum; History of substance use; Short interval between pregnancies affecting pregnancy in second trimester, antepartum; History of gestational diabetes mellitus (GDM); Obesity affecting pregnancy in second trimester; and Late prenatal care affecting pregnancy, antepartum on their problem list.  Patient reports  reports tooth pain, was given abx, still hurting  .  Contractions: Not present. Vag. Bleeding: None.  Movement: Present. Denies leaking of fluid.   The following portions of the patient's history were reviewed and updated as appropriate: allergies, current medications, past family history, past medical history, past social history, past surgical history and problem list.   Objective:   Vitals:   10/16/22 1008  BP: 115/75  Pulse: (!) 102  Weight: 249 lb (112.9 kg)    Fetal Status: Fetal Heart Rate (bpm): 142   Movement: Present     General:  Alert, oriented and cooperative. Patient is in no acute distress.  Skin: Skin is warm and dry. No rash noted.   Cardiovascular: Normal heart rate noted  Respiratory: Normal respiratory effort, no problems with respiration noted  Abdomen: Soft, gravid, appropriate for gestational age.  Pain/Pressure: Absent     Pelvic: Cervical exam deferred        Extremities: Normal range of motion.  Edema: None  Mental Status: Normal mood and affect. Normal behavior. Normal judgment and thought content.   Assessment and Plan:  Pregnancy: G3P2002 at [redacted]w[redacted]d 1. Supervision of high risk pregnancy, antepartum BP and FHR normal Feeling regular fetal movement   2. History of gestational diabetes mellitus (GDM) GTT today   3.  [redacted] weeks gestation of pregnancy GTT and labs today  Tdap given today    4. Short interval between pregnancies affecting pregnancy in second trimester, antepartum  5. Pain, dental Taking tylenol as needed, encouraged heat/ice. Trying to get appt scheduled, discussed emergency walk in if cannot get in soon    Preterm labor symptoms and general obstetric precautions including but not limited to vaginal bleeding, contractions, leaking of fluid and fetal movement were reviewed in detail with the patient. Please refer to After Visit Summary for other counseling recommendations.   Return in two weeks for routine prenatal   Future Appointments  Date Time Provider Department Center  10/30/2022  1:15 PM Warden Fillers, MD [facility]  11/14/2022  1:30 PM [facility]    Albertine Grates, FNP

## 2022-10-17 LAB — GLUCOSE TOLERANCE, 2 HOURS W/ 1HR
Glucose, 1 hour: 187 mg/dL — ABNORMAL HIGH (ref 70–179)
Glucose, 2 hour: 77 mg/dL (ref 70–152)
Glucose, Fasting: 85 mg/dL (ref 70–91)

## 2022-10-17 LAB — CBC
Hematocrit: 31.1 % — ABNORMAL LOW (ref 34.0–46.6)
Hemoglobin: 10.2 g/dL — ABNORMAL LOW (ref 11.1–15.9)
MCH: 28.3 pg (ref 26.6–33.0)
MCHC: 32.8 g/dL (ref 31.5–35.7)
MCV: 86 fL (ref 79–97)
Platelets: 283 10*3/uL (ref 150–450)
RBC: 3.61 x10E6/uL — ABNORMAL LOW (ref 3.77–5.28)
RDW: 15 % (ref 11.7–15.4)
WBC: 11.4 10*3/uL — ABNORMAL HIGH (ref 3.4–10.8)

## 2022-10-17 LAB — HIV ANTIBODY (ROUTINE TESTING W REFLEX): HIV Screen 4th Generation wRfx: NONREACTIVE

## 2022-10-17 LAB — RPR: RPR Ser Ql: NONREACTIVE

## 2022-10-18 ENCOUNTER — Other Ambulatory Visit: Payer: Self-pay | Admitting: Family Medicine

## 2022-10-18 ENCOUNTER — Encounter: Payer: Self-pay | Admitting: Family Medicine

## 2022-10-18 DIAGNOSIS — O99013 Anemia complicating pregnancy, third trimester: Secondary | ICD-10-CM | POA: Insufficient documentation

## 2022-10-18 DIAGNOSIS — O24419 Gestational diabetes mellitus in pregnancy, unspecified control: Secondary | ICD-10-CM | POA: Insufficient documentation

## 2022-10-18 MED ORDER — ACCU-CHEK GUIDE VI STRP: ORAL_STRIP | 12 refills | Status: DC

## 2022-10-18 MED ORDER — ACCU-CHEK GUIDE W/DEVICE KIT: 1.0000 | PACK | Freq: Once | 0 refills | Status: AC

## 2022-10-18 MED ORDER — IRON (FERROUS SULFATE) 325 (65 FE) MG PO TABS: 1.0000 | ORAL_TABLET | ORAL | 3 refills | Status: DC

## 2022-10-18 MED ORDER — ACCU-CHEK SOFTCLIX LANCETS MISC: 12 refills | Status: DC

## 2022-10-30 ENCOUNTER — Ambulatory Visit (INDEPENDENT_AMBULATORY_CARE_PROVIDER_SITE_OTHER): Payer: Medicaid Other | Admitting: Obstetrics and Gynecology

## 2022-10-30 ENCOUNTER — Other Ambulatory Visit: Payer: Self-pay

## 2022-10-30 VITALS — BP 115/74 | HR 125 | Wt 241.9 lb

## 2022-10-30 DIAGNOSIS — O0930 Supervision of pregnancy with insufficient antenatal care, unspecified trimester: Secondary | ICD-10-CM

## 2022-10-30 DIAGNOSIS — O0933 Supervision of pregnancy with insufficient antenatal care, third trimester: Secondary | ICD-10-CM

## 2022-10-30 DIAGNOSIS — O99212 Obesity complicating pregnancy, second trimester: Secondary | ICD-10-CM

## 2022-10-30 DIAGNOSIS — O99213 Obesity complicating pregnancy, third trimester: Secondary | ICD-10-CM

## 2022-10-30 DIAGNOSIS — Z3A30 30 weeks gestation of pregnancy: Secondary | ICD-10-CM

## 2022-10-30 DIAGNOSIS — O09893 Supervision of other high risk pregnancies, third trimester: Secondary | ICD-10-CM

## 2022-10-30 DIAGNOSIS — O09892 Supervision of other high risk pregnancies, second trimester: Secondary | ICD-10-CM

## 2022-10-30 DIAGNOSIS — O24419 Gestational diabetes mellitus in pregnancy, unspecified control: Secondary | ICD-10-CM

## 2022-10-30 DIAGNOSIS — O0990 Supervision of high risk pregnancy, unspecified, unspecified trimester: Secondary | ICD-10-CM

## 2022-10-30 DIAGNOSIS — Z87898 Personal history of other specified conditions: Secondary | ICD-10-CM

## 2022-10-30 DIAGNOSIS — O0993 Supervision of high risk pregnancy, unspecified, third trimester: Secondary | ICD-10-CM

## 2022-10-30 MED ORDER — ACCU-CHEK GUIDE W/DEVICE KIT: 1.0000 | PACK | Freq: Four times a day (QID) | 0 refills | Status: DC

## 2022-10-30 MED ORDER — ACCU-CHEK GUIDE VI STRP
ORAL_STRIP | 12 refills | Status: DC
Start: 2022-10-30 — End: 2022-12-23

## 2022-10-30 NOTE — Progress Notes (Signed)
   PRENATAL VISIT NOTE  Subjective:  Tamara Bryant is a 32 y.o. G3P2002 at [redacted]w[redacted]d being seen today for ongoing prenatal care.  She is currently monitored for the following issues for this high-risk pregnancy and has BMI 39.0-39.9,adult; Mild intermittent asthma; High blood hemoglobin F (HCC); Supervision of high risk pregnancy, antepartum; History of substance use; Short interval between pregnancies affecting pregnancy in second trimester, antepartum; History of gestational diabetes mellitus (GDM); Obesity affecting pregnancy in second trimester; Late prenatal care affecting pregnancy, antepartum; Gestational diabetes mellitus (GDM), antepartum; and Anemia affecting pregnancy in third trimester on their problem list.  Patient doing well with no acute concerns today. She reports no complaints.  Contractions: Irritability. Vag. Bleeding: None.  Movement: Present. Denies leaking of fluid.   The following portions of the patient's history were reviewed and updated as appropriate: allergies, current medications, past family history, past medical history, past social history, past surgical history and problem list. Problem list updated.  Objective:   Vitals:   10/30/22 1330  BP: 115/74  Pulse: (!) 125  Weight: 241 lb 14.4 oz (109.7 kg)    Fetal Status: Fetal Heart Rate (bpm): 141 Fundal Height: 31 cm Movement: Present     General:  Alert, oriented and cooperative. Patient is in no acute distress.  Skin: Skin is warm and dry. No rash noted.   Cardiovascular: Normal heart rate noted  Respiratory: Normal respiratory effort, no problems with respiration noted  Abdomen: Soft, gravid, appropriate for gestational age.  Pain/Pressure: Present     Pelvic: Cervical exam deferred        Extremities: Normal range of motion.  Edema: Trace  Mental Status:  Normal mood and affect. Normal behavior. Normal judgment and thought content.   Assessment and Plan:  Pregnancy: G3P2002 at [redacted]w[redacted]d  1. Gestational  diabetes mellitus (GDM) in third trimester, gestational diabetes method of control unspecified Pt advised to start checking blood sugar Will reassess at next visit as she already know how to check herself  - Ambulatory referral to Nutrition and Diabetic Education - glucose blood (ACCU-CHEK GUIDE) test strip; Use as instructed QID  Dispense: 100 each; Refill: 12  2. [redacted] weeks gestation of pregnancy   3. Gestational diabetes mellitus (GDM), antepartum, gestational diabetes method of control unspecified   4. History of substance use   5. Late prenatal care affecting pregnancy, antepartum   6. Obesity affecting pregnancy in second trimester, unspecified obesity type   7. Supervision of high risk pregnancy, antepartum Continue routine prenatal care   8. Short interval between pregnancies affecting pregnancy in second trimester, antepartum No s/sx of preterm labor  Preterm labor symptoms and general obstetric precautions including but not limited to vaginal bleeding, contractions, leaking of fluid and fetal movement were reviewed in detail with the patient.  Please refer to After Visit Summary for other counseling recommendations.   Return in about 2 weeks (around 11/13/2022) for [facility], in person.   Mariel Aloe, MD Faculty Attending [facility]

## 2022-11-01 ENCOUNTER — Encounter: Payer: Self-pay | Admitting: *Deleted

## 2022-11-07 ENCOUNTER — Encounter: Payer: Medicaid Other | Attending: Obstetrics and Gynecology | Admitting: Dietician

## 2022-11-07 ENCOUNTER — Ambulatory Visit (INDEPENDENT_AMBULATORY_CARE_PROVIDER_SITE_OTHER): Payer: Medicaid Other

## 2022-11-07 ENCOUNTER — Other Ambulatory Visit: Payer: Self-pay

## 2022-11-07 DIAGNOSIS — O403XX0 Polyhydramnios, third trimester, not applicable or unspecified: Secondary | ICD-10-CM | POA: Insufficient documentation

## 2022-11-07 DIAGNOSIS — O24419 Gestational diabetes mellitus in pregnancy, unspecified control: Secondary | ICD-10-CM

## 2022-11-07 DIAGNOSIS — Z3A34 34 weeks gestation of pregnancy: Secondary | ICD-10-CM | POA: Insufficient documentation

## 2022-11-07 DIAGNOSIS — Z713 Dietary counseling and surveillance: Secondary | ICD-10-CM | POA: Insufficient documentation

## 2022-11-07 DIAGNOSIS — O24415 Gestational diabetes mellitus in pregnancy, controlled by oral hypoglycemic drugs: Secondary | ICD-10-CM | POA: Insufficient documentation

## 2022-11-07 DIAGNOSIS — Z3A31 31 weeks gestation of pregnancy: Secondary | ICD-10-CM

## 2022-11-07 NOTE — Progress Notes (Signed)
Patient was seen for Gestational Diabetes self-management on 11/07/2022  Start time 1120 and End time 1155   Estimated due date: 01/02/2022; [redacted]w[redacted]d   Clinical: Medications: iron, prn reglan, prn pepcid, prenatal MVI Medical History: GDM Labs:  A1c 4.6% 08/02/2022   Dietary and Lifestyle History: Patient lives with her husband, 44 and 30 yo.  She had GDM during her last pregnancy. She is not currently working. Lactose intolerant. Receives [facility]  Physical Activity: ADL's Stress: 10/10 Sleep: 5-6 hours per day  24 hr Recall:  First Meal: giant waffle with lots of regular syrup, OJ Snack: none Second meal:  mini lemon muffins Snack:none Third meal:  corn dogs, hot and spicy beef noodles Snack:  none Beverages:  water, juice, occasional milk, rare coffee with sugar and cream  NUTRITION INTERVENTION  Nutrition education (E-1) on the following topics:   Initial Follow-up  [x]  []  Definition of Gestational Diabetes [x]  []  Why dietary management is important in controlling blood glucose [x]  []  Effects each nutrient has on blood glucose levels [x]  []  Simple carbohydrates vs complex carbohydrates [x]  []  Fluid intake [x]  []  Creating a balanced meal plan []  []  Carbohydrate counting  [x]  []  When to check blood glucose levels [x]  []  Proper blood glucose monitoring techniques [x]  []  Effect of stress and stress reduction techniques  [x]  []  Exercise effect on blood glucose levels, appropriate exercise during pregnancy []  []  Importance of limiting caffeine and abstaining from alcohol and smoking [x]  []  Medications used for blood sugar control during pregnancy [x]  []  Hypoglycemia and rule of 15 [x]  []  Postpartum self care  Patient has a meter prior to visit. Patient is not testing as she has not picked up her strips. Postprandial: 67  Patient instructed to monitor glucose levels: FBS: 60 - <= 95 mg/dL; 2 hour: <= 161 mg/dL  Patient received handouts: Nutrition Diabetes and  Pregnancy Carbohydrate Counting List Blood glucose log Snack ideas for diabetes during pregnancy  Patient will be seen for follow-up as needed.

## 2022-11-14 ENCOUNTER — Encounter: Payer: Self-pay | Admitting: Family Medicine

## 2022-11-14 ENCOUNTER — Ambulatory Visit (INDEPENDENT_AMBULATORY_CARE_PROVIDER_SITE_OTHER): Payer: Medicaid Other | Admitting: Family Medicine

## 2022-11-14 ENCOUNTER — Other Ambulatory Visit: Payer: Self-pay

## 2022-11-14 ENCOUNTER — Other Ambulatory Visit: Payer: Self-pay | Admitting: *Deleted

## 2022-11-14 ENCOUNTER — Ambulatory Visit: Payer: Medicaid Other | Attending: Obstetrics and Gynecology

## 2022-11-14 VITALS — BP 101/67 | HR 98 | Wt 248.5 lb

## 2022-11-14 DIAGNOSIS — O24419 Gestational diabetes mellitus in pregnancy, unspecified control: Secondary | ICD-10-CM

## 2022-11-14 DIAGNOSIS — O24415 Gestational diabetes mellitus in pregnancy, controlled by oral hypoglycemic drugs: Secondary | ICD-10-CM

## 2022-11-14 DIAGNOSIS — O09892 Supervision of other high risk pregnancies, second trimester: Secondary | ICD-10-CM

## 2022-11-14 DIAGNOSIS — O99212 Obesity complicating pregnancy, second trimester: Secondary | ICD-10-CM

## 2022-11-14 DIAGNOSIS — O99613 Diseases of the digestive system complicating pregnancy, third trimester: Secondary | ICD-10-CM

## 2022-11-14 DIAGNOSIS — O403XX0 Polyhydramnios, third trimester, not applicable or unspecified: Secondary | ICD-10-CM

## 2022-11-14 DIAGNOSIS — E669 Obesity, unspecified: Secondary | ICD-10-CM

## 2022-11-14 DIAGNOSIS — O99013 Anemia complicating pregnancy, third trimester: Secondary | ICD-10-CM

## 2022-11-14 DIAGNOSIS — K219 Gastro-esophageal reflux disease without esophagitis: Secondary | ICD-10-CM

## 2022-11-14 DIAGNOSIS — O09893 Supervision of other high risk pregnancies, third trimester: Secondary | ICD-10-CM | POA: Insufficient documentation

## 2022-11-14 DIAGNOSIS — O99213 Obesity complicating pregnancy, third trimester: Secondary | ICD-10-CM | POA: Diagnosis present

## 2022-11-14 DIAGNOSIS — Z3A32 32 weeks gestation of pregnancy: Secondary | ICD-10-CM

## 2022-11-14 DIAGNOSIS — O0990 Supervision of high risk pregnancy, unspecified, unspecified trimester: Secondary | ICD-10-CM

## 2022-11-14 DIAGNOSIS — O0993 Supervision of high risk pregnancy, unspecified, third trimester: Secondary | ICD-10-CM

## 2022-11-14 DIAGNOSIS — J45909 Unspecified asthma, uncomplicated: Secondary | ICD-10-CM

## 2022-11-14 DIAGNOSIS — O99513 Diseases of the respiratory system complicating pregnancy, third trimester: Secondary | ICD-10-CM

## 2022-11-14 MED ORDER — PANTOPRAZOLE SODIUM 20 MG PO TBEC: 20.0000 mg | DELAYED_RELEASE_TABLET | Freq: Two times a day (BID) | ORAL | 5 refills | Status: DC

## 2022-11-14 MED ORDER — METFORMIN HCL 500 MG PO TABS: 500.0000 mg | ORAL_TABLET | Freq: Two times a day (BID) | ORAL | 5 refills | Status: DC

## 2022-11-14 NOTE — Patient Instructions (Signed)

## 2022-11-14 NOTE — Progress Notes (Signed)
   Subjective:  Tamara Bryant is a 32 y.o. G3P2002 at [redacted]w[redacted]d being seen today for ongoing prenatal care.  She is currently monitored for the following issues for this high-risk pregnancy and has BMI 39.0-39.9,adult; High blood hemoglobin F (HCC); Supervision of high risk pregnancy, antepartum; History of substance use; Short interval between pregnancies affecting pregnancy in second trimester, antepartum; History of gestational diabetes mellitus (GDM); Obesity affecting pregnancy in second trimester; Late prenatal care affecting pregnancy, antepartum; Gestational diabetes mellitus (GDM), antepartum; and Anemia affecting pregnancy in third trimester on their problem list.  Patient reports no complaints.  Contractions: Irritability. Vag. Bleeding: None.  Movement: Present. Denies leaking of fluid.   The following portions of the patient's history were reviewed and updated as appropriate: allergies, current medications, past family history, past medical history, past social history, past surgical history and problem list. Problem list updated.  Objective:   Vitals:   11/14/22 1428  BP: 101/67  Pulse: 98  Weight: 248 lb 8 oz (112.7 kg)    Fetal Status: Fetal Heart Rate (bpm): 144   Movement: Present     General:  Alert, oriented and cooperative. Patient is in no acute distress.  Skin: Skin is warm and dry. No rash noted.   Cardiovascular: Normal heart rate noted  Respiratory: Normal respiratory effort, no problems with respiration noted  Abdomen: Soft, gravid, appropriate for gestational age. Pain/Pressure: Present     Pelvic: Vag. Bleeding: None     Cervical exam deferred        Extremities: Normal range of motion.  Edema: Trace (numbing and swelling in hands)  Mental Status: Normal mood and affect. Normal behavior. Normal judgment and thought content.   Urinalysis:        Assessment and Plan:  Pregnancy: G3P2002 at [redacted]w[redacted]d  1. Supervision of high risk pregnancy, antepartum BP and  FHR normal Having lots of different symptoms Having heartburn, zantac not helping, trial PPI Lots of pelvic and abdominal pain, discussed poly likely contributing and that treating this would be most helpful. Also recommended trial of maternity belt. Having numbness in her hands, likely carpal tunnel, discussed etiology and typical course Given likely uncontrolled GDM would not recommend steroid injections  2. Gestational diabetes mellitus (GDM), antepartum, gestational diabetes method of control unspecified Log reviewed, generally uncontrolled, especially fastings and not enough data for post prandials Growth [place] earlier today showed EFW 59%, 2194g, AC 58%, AFI 32 Discussed that based on log and polyhydramnios on [place] this is evidence of poor glucose control Recommended starting metformin 500 mg BID, she is amenable Discussed timing of delivery will be 37-39 weeks pending clinical course Following closely w MFM  3. Short interval between pregnancies affecting pregnancy in second trimester, antepartum Last delivery 6/ 2023  4. Obesity affecting pregnancy in second trimester, unspecified obesity type F/w MFM  5. Anemia affecting pregnancy in third trimester Lab Results  Component Value Date   HGB 10.2 (L) 10/16/2022   Mild, on PO iron  Preterm labor symptoms and general obstetric precautions including but not limited to vaginal bleeding, contractions, leaking of fluid and fetal movement were reviewed in detail with the patient. Please refer to After Visit Summary for other counseling recommendations.  Return in 2 weeks (on 11/28/2022) for [facility], ob visit.   Venora Maples, MD

## 2022-11-15 ENCOUNTER — Encounter (HOSPITAL_COMMUNITY): Payer: Self-pay | Admitting: Obstetrics and Gynecology

## 2022-11-15 ENCOUNTER — Inpatient Hospital Stay (HOSPITAL_COMMUNITY)
Admission: AD | Admit: 2022-11-15 | Discharge: 2022-11-15 | Disposition: A | Discharge status: Home / Self Care | Payer: Medicaid Other | Attending: Obstetrics and Gynecology | Admitting: Obstetrics and Gynecology

## 2022-11-15 ENCOUNTER — Other Ambulatory Visit: Payer: Self-pay

## 2022-11-15 DIAGNOSIS — N760 Acute vaginitis: Secondary | ICD-10-CM | POA: Diagnosis not present

## 2022-11-15 DIAGNOSIS — B9689 Other specified bacterial agents as the cause of diseases classified elsewhere: Secondary | ICD-10-CM | POA: Diagnosis not present

## 2022-11-15 DIAGNOSIS — R519 Headache, unspecified: Secondary | ICD-10-CM | POA: Insufficient documentation

## 2022-11-15 DIAGNOSIS — O23593 Infection of other part of genital tract in pregnancy, third trimester: Secondary | ICD-10-CM | POA: Insufficient documentation

## 2022-11-15 DIAGNOSIS — O24419 Gestational diabetes mellitus in pregnancy, unspecified control: Secondary | ICD-10-CM | POA: Diagnosis not present

## 2022-11-15 DIAGNOSIS — O0930 Supervision of pregnancy with insufficient antenatal care, unspecified trimester: Secondary | ICD-10-CM

## 2022-11-15 DIAGNOSIS — M549 Dorsalgia, unspecified: Secondary | ICD-10-CM

## 2022-11-15 DIAGNOSIS — O0933 Supervision of pregnancy with insufficient antenatal care, third trimester: Secondary | ICD-10-CM | POA: Diagnosis not present

## 2022-11-15 DIAGNOSIS — O0990 Supervision of high risk pregnancy, unspecified, unspecified trimester: Secondary | ICD-10-CM

## 2022-11-15 DIAGNOSIS — Z1152 Encounter for screening for COVID-19: Secondary | ICD-10-CM | POA: Diagnosis not present

## 2022-11-15 DIAGNOSIS — M545 Low back pain, unspecified: Secondary | ICD-10-CM | POA: Insufficient documentation

## 2022-11-15 DIAGNOSIS — Z3A33 33 weeks gestation of pregnancy: Secondary | ICD-10-CM

## 2022-11-15 DIAGNOSIS — O26893 Other specified pregnancy related conditions, third trimester: Secondary | ICD-10-CM | POA: Diagnosis present

## 2022-11-15 DIAGNOSIS — R109 Unspecified abdominal pain: Secondary | ICD-10-CM

## 2022-11-15 DIAGNOSIS — O99013 Anemia complicating pregnancy, third trimester: Secondary | ICD-10-CM | POA: Insufficient documentation

## 2022-11-15 LAB — WET PREP, GENITAL
Sperm: NONE SEEN
Trich, Wet Prep: NONE SEEN
WBC, Wet Prep HPF POC: >=10 — AB (ref <10)
Yeast Wet Prep HPF POC: NONE SEEN

## 2022-11-15 LAB — URINALYSIS, ROUTINE W REFLEX MICROSCOPIC
Bilirubin Urine: NEGATIVE
Glucose, UA: NEGATIVE mg/dL
Hgb urine dipstick: NEGATIVE
Ketones, ur: NEGATIVE mg/dL
Leukocytes,Ua: NEGATIVE
Nitrite: NEGATIVE
Protein, ur: NEGATIVE mg/dL
Specific Gravity, Urine: 1.014 (ref 1.005–1.030)
pH: 7 (ref 5.0–8.0)

## 2022-11-15 LAB — SARS CORONAVIRUS 2 BY RT PCR: SARS Coronavirus 2 by RT PCR: NEGATIVE

## 2022-11-15 MED ORDER — CYCLOBENZAPRINE HCL 5 MG PO TABS
10.0000 mg | ORAL_TABLET | Freq: Once | ORAL | Status: AC
  Administered 2022-11-15: 10 mg via ORAL
  Filled 2022-11-15: qty 2

## 2022-11-15 MED ORDER — ACETAMINOPHEN 500 MG PO TABS
1000.0000 mg | ORAL_TABLET | Freq: Once | ORAL | Status: AC
  Administered 2022-11-15: 1000 mg via ORAL
  Filled 2022-11-15: qty 2

## 2022-11-15 NOTE — MAU Provider Note (Signed)
History     CSN: [id number]  Arrival date and time: 11/15/22 9562   Event Date/Time   First Provider Initiated Contact with Patient 11/15/22 1102      Chief Complaint  Patient presents with   Abdominal Pain   Back Pain   Headache   Tamara Bryant , a  32 y.o. G3P2002 at [redacted]w[redacted]d presents to MAU with complaints of on-going lower back pain and abdominal pain. Patient reports patient on the right low back that radiates down into the back of her thigh. She reports that the pain is sharp and constant and worsened by movement. She also reports lower abdominal pressure and sharp vaginal pain. She states that this is an on-going pain and is also constant. She endorses some lower abdominal cramping that started this morning around 5 am. She states that the pain is sharp but intermittent. She denies urinary symptoms but noted a yellow vaginal discharge that started yesterday. She denies odor or itching. She endorses positive fetal movement. She also complains of carpel tunnel symptoms in both hands. She has not tried anything to relieve any of her symptoms.   She also has cold-like symptoms that started yesterday.          OB History     Gravida  3   Para  2   Term  2   Preterm      AB      Living  2      SAB      IAB      Ectopic      Multiple  0   Live Births  2        Obstetric Comments  06/2021: in [place], [place]. Had GDM. They recommended meds but pt didn't start. Pt unsure of exact weight but was 7+ lbs.          Past Medical History:  Diagnosis Date   Anemia    Asthma    Gestational diabetes    History of chlamydia and gonorrhea  08/13/2017   History of tobacco use    History of Trichomonas in Pregnancy 10/31/2017   Marijuana use    Mild intermittent asthma 08/13/2017   History of hospitalizations as an infant  Albuterol prn only      Obesity    Seasonal allergies     Past Surgical History:  Procedure Laterality Date   WISDOM TOOTH EXTRACTION       Family History  Problem Relation Age of Onset   Hypertension Mother    Sickle cell anemia Mother    Diabetes Father    Hypertension Father    Diabetes Maternal Grandfather    Diabetes Paternal Grandmother     Social History   Tobacco Use   Smoking status: Every Day    Current packs/day: 0.25    Average packs/day: 0.3 packs/day for 12.9 years (3.2 ttl pk-yrs)    Types: Cigarettes    Start date: 2012   Smokeless tobacco: Former    Quit date: 07/09/2017   Tobacco comments:    07/06/2017  Vaping Use   Vaping status: Former  Substance Use Topics   Alcohol use: Not Currently    Alcohol/week: 4.0 standard drinks of alcohol    Types: 1 Glasses of wine, 3 Shots of liquor per week   Drug use: Not Currently    Types: Marijuana, Cocaine    Comment: Cocaine June 2024, Marijauna April 2024    Allergies: No Known Allergies  Medications Prior  to Admission  Medication Sig Dispense Refill Last Dose   aspirin EC 81 MG tablet Take 1 tablet (81 mg total) by mouth daily. 60 tablet 2 11/14/2022   Iron, Ferrous Sulfate, 325 (65 Fe) MG TABS Take 1 tablet by mouth every other day. 30 tablet 3 11/14/2022   Prenatal Vit-Fe Fumarate-FA (PREPLUS) 27-1 MG TABS Take 1 tablet by mouth daily. 60 tablet 2 11/14/2022   Accu-Chek Softclix Lancets lancets Use as instructed QID 100 each 12    Blood Glucose Monitoring Suppl (ACCU-CHEK GUIDE) w/Device KIT 1 each by Does not apply route 4 (four) times daily. 1 kit 0    glucose blood (ACCU-CHEK GUIDE) test strip Use as instructed QID 100 each 12    metFORMIN (GLUCOPHAGE) 500 MG tablet Take 1 tablet (500 mg total) by mouth 2 (two) times daily with a meal. 60 tablet 5    pantoprazole (PROTONIX) 20 MG tablet Take 1 tablet (20 mg total) by mouth 2 (two) times daily before a meal. 60 tablet 5     Review of Systems  Constitutional:  Negative for chills, fatigue and fever.  HENT:  Positive for congestion, postnasal drip and sneezing.   Eyes:  Negative for pain  and visual disturbance.  Respiratory:  Negative for apnea, shortness of breath and wheezing.   Cardiovascular:  Negative for chest pain and palpitations.  Gastrointestinal:  Positive for abdominal pain. Negative for constipation, diarrhea, nausea and vomiting.  Genitourinary:  Positive for vaginal discharge. Negative for difficulty urinating, dysuria, pelvic pain, vaginal bleeding and vaginal pain.  Musculoskeletal:  Positive for back pain.  Neurological:  Positive for headaches. Negative for seizures and weakness.  Psychiatric/Behavioral:  Negative for suicidal ideas.    Physical Exam   Blood pressure 123/76, pulse (!) 112, temperature 97.9 F (36.6 C), temperature source Oral, resp. rate 19, height 5\' 5"  (1.651 m), weight 113.1 kg, last menstrual period 03/29/2022, SpO2 99%, unknown if currently breastfeeding.  Physical Exam Vitals and nursing note reviewed.  Constitutional:      General: She is not in acute distress.    Appearance: Normal appearance. She is ill-appearing.  HENT:     Head: Normocephalic.  Cardiovascular:     Rate and Rhythm: Tachycardia present.  Pulmonary:     Effort: Pulmonary effort is normal.  Abdominal:     Palpations: Abdomen is soft.     Tenderness: There is no abdominal tenderness.  Musculoskeletal:     Cervical back: Normal range of motion.  Skin:    General: Skin is warm and dry.  Neurological:     Mental Status: She is alert and oriented to person, place, and time.  Psychiatric:        Mood and Affect: Mood normal.    FHT: 150 bpm with moderate variability, accels present, no decels.  Toco: UI noted.   MAU Course  Procedures Orders Placed This Encounter  Procedures   SARS Coronavirus 2 by RT PCR (hospital order, performed in [facility] hospital lab) *cepheid single result test* Anterior Nasal Swab   Wet prep, genital   Urinalysis, Routine w reflex microscopic -Urine, Clean Catch   Airborne and Contact precautions   Results for orders  placed or performed during the hospital encounter of 11/15/22 (from the past 24 hour(s))  SARS Coronavirus 2 by RT PCR (hospital order, performed in [facility] hospital lab) *cepheid single result test* Anterior Nasal Swab     Status: None   Collection Time: 11/15/22  9:59 AM  Specimen: Anterior Nasal Swab  Result Value Ref Range   SARS Coronavirus 2 by RT PCR NEGATIVE NEGATIVE   Meds ordered this encounter  Medications   cyclobenzaprine (FLEXERIL) tablet 10 mg   acetaminophen (TYLENOL) tablet 1,000 mg   Results for orders placed or performed during the hospital encounter of 11/15/22 (from the past 24 hour(s))  Urinalysis, Routine w reflex microscopic -Urine, Clean Catch     Status: Abnormal   Collection Time: 11/15/22  9:43 AM  Result Value Ref Range   Color, Urine YELLOW YELLOW   APPearance HAZY (A) CLEAR   Specific Gravity, Urine 1.014 1.005 - 1.030   pH 7.0 5.0 - 8.0   Glucose, UA NEGATIVE NEGATIVE mg/dL   Hgb urine dipstick NEGATIVE NEGATIVE   Bilirubin Urine NEGATIVE NEGATIVE   Ketones, ur NEGATIVE NEGATIVE mg/dL   Protein, ur NEGATIVE NEGATIVE mg/dL   Nitrite NEGATIVE NEGATIVE   Leukocytes,Ua NEGATIVE NEGATIVE  SARS Coronavirus 2 by RT PCR (hospital order, performed in [facility] hospital lab) *cepheid single result test* Anterior Nasal Swab     Status: None   Collection Time: 11/15/22  9:59 AM   Specimen: Anterior Nasal Swab  Result Value Ref Range   SARS Coronavirus 2 by RT PCR NEGATIVE NEGATIVE  Wet prep, genital     Status: Abnormal   Collection Time: 11/15/22 11:23 AM  Result Value Ref Range   Yeast Wet Prep HPF POC NONE SEEN NONE SEEN   Trich, Wet Prep NONE SEEN NONE SEEN   Clue Cells Wet Prep HPF POC PRESENT (A) NONE SEEN   WBC, Wet Prep HPF POC >=10 (A) <10   Sperm NONE SEEN      MDM - Wet prep positive for BV - COVID negative  - UA hazy otherwise negative.  - Pain relieved with PO meds  - plan for discharge.   Assessment and Plan   1. Right-sided  back pain, unspecified back location, unspecified chronicity   2. Supervision of high risk pregnancy, antepartum   3. Late prenatal care affecting pregnancy, antepartum   4. Gestational diabetes mellitus (GDM), antepartum, gestational diabetes method of control unspecified   5. Anemia affecting pregnancy in third trimester   6. Abdominal cramping   7. [redacted] weeks gestation of pregnancy   8. BV (bacterial vaginosis)    - Reviewed worsening signs and return precautions.  - Discussed comfort measures like a maternity support belt, and braces.  - Rx for Metronidazole sent to outpatient pharmacy.  - Patient discharged home in stable condition and may return to MAU as needed.   Claudette Head, MSN CNM  11/15/2022, 11:02 AM

## 2022-11-15 NOTE — MAU Note (Signed)
Tamara Bryant is a 32 y.o. at [redacted]w[redacted]d here in MAU reporting: she has upper abdominal , cramping  and lower back pain that began 2 days ago.  Also reports current HA and bilateral hand pain, hasn't taken any meds to treat discomfort. Endorses +FM, denies VB or LOF. LMP: NA Onset of complaint: 2 days ago Pain score: 8 abdomen & 10 back Vitals:   11/15/22 0939  BP: 112/63  Pulse: (!) 114  Resp: 19  Temp: 97.9 F (36.6 C)  SpO2: 99%     FHT: 143 bpm Lab orders placed from triage:   UA

## 2022-11-16 LAB — GC/CHLAMYDIA PROBE AMP (~~LOC~~) NOT AT ARMC
Chlamydia: NEGATIVE
Comment: NEGATIVE
Comment: NORMAL
Neisseria Gonorrhea: NEGATIVE

## 2022-11-17 ENCOUNTER — Telehealth: Payer: Self-pay

## 2022-11-17 NOTE — Telephone Encounter (Signed)
Called patient to see if she could come in for her appointment 11/26 at 12:30pm  Not available and voice mail box not set up.

## 2022-11-20 ENCOUNTER — Other Ambulatory Visit: Payer: Self-pay

## 2022-11-20 ENCOUNTER — Ambulatory Visit (HOSPITAL_BASED_OUTPATIENT_CLINIC_OR_DEPARTMENT_OTHER): Payer: Medicaid Other | Admitting: *Deleted

## 2022-11-20 ENCOUNTER — Encounter: Payer: Self-pay | Admitting: *Deleted

## 2022-11-20 ENCOUNTER — Ambulatory Visit: Payer: Medicaid Other | Attending: Maternal & Fetal Medicine | Admitting: *Deleted

## 2022-11-20 VITALS — BP 120/67 | HR 97

## 2022-11-20 DIAGNOSIS — O99213 Obesity complicating pregnancy, third trimester: Secondary | ICD-10-CM

## 2022-11-20 DIAGNOSIS — Z3A33 33 weeks gestation of pregnancy: Secondary | ICD-10-CM | POA: Diagnosis not present

## 2022-11-20 DIAGNOSIS — O99013 Anemia complicating pregnancy, third trimester: Secondary | ICD-10-CM

## 2022-11-20 DIAGNOSIS — O0930 Supervision of pregnancy with insufficient antenatal care, unspecified trimester: Secondary | ICD-10-CM

## 2022-11-20 DIAGNOSIS — O24419 Gestational diabetes mellitus in pregnancy, unspecified control: Secondary | ICD-10-CM | POA: Diagnosis present

## 2022-11-20 DIAGNOSIS — O24415 Gestational diabetes mellitus in pregnancy, controlled by oral hypoglycemic drugs: Secondary | ICD-10-CM | POA: Insufficient documentation

## 2022-11-20 DIAGNOSIS — O0990 Supervision of high risk pregnancy, unspecified, unspecified trimester: Secondary | ICD-10-CM

## 2022-11-20 NOTE — Procedures (Signed)
Tamara Bryant April 16, 1990 [redacted]w[redacted]d  Fetus A Non-Stress Test Interpretation for 11/20/22  Indication: Gestational Diabetes medication controlled  Fetal Heart Rate A Mode: External Baseline Rate (A): 145 bpm Variability: Minimal, Moderate Accelerations: 15 x 15 Decelerations: None Multiple birth?: No  Uterine Activity Mode: Palpation, Toco Contraction Frequency (min): none Resting Tone Palpated: Relaxed  Interpretation (Fetal Testing) Nonstress Test Interpretation: Reactive Overall Impression: Reassuring for gestational age Comments: Dr. Parke Poisson reviewd tracing

## 2022-11-22 ENCOUNTER — Encounter: Payer: Self-pay | Admitting: *Deleted

## 2022-11-22 DIAGNOSIS — O409XX0 Polyhydramnios, unspecified trimester, not applicable or unspecified: Secondary | ICD-10-CM | POA: Insufficient documentation

## 2022-11-27 ENCOUNTER — Ambulatory Visit: Payer: Medicaid Other | Admitting: Family Medicine

## 2022-11-27 ENCOUNTER — Other Ambulatory Visit: Payer: Self-pay

## 2022-11-27 ENCOUNTER — Encounter: Payer: Self-pay | Admitting: Family Medicine

## 2022-11-27 VITALS — BP 110/70 | HR 96 | Wt 245.8 lb

## 2022-11-27 DIAGNOSIS — Z3A34 34 weeks gestation of pregnancy: Secondary | ICD-10-CM

## 2022-11-27 DIAGNOSIS — O99212 Obesity complicating pregnancy, second trimester: Secondary | ICD-10-CM

## 2022-11-27 DIAGNOSIS — Z2911 Encounter for prophylactic immunotherapy for respiratory syncytial virus (RSV): Secondary | ICD-10-CM | POA: Diagnosis not present

## 2022-11-27 DIAGNOSIS — O409XX0 Polyhydramnios, unspecified trimester, not applicable or unspecified: Secondary | ICD-10-CM

## 2022-11-27 DIAGNOSIS — O0990 Supervision of high risk pregnancy, unspecified, unspecified trimester: Secondary | ICD-10-CM

## 2022-11-27 DIAGNOSIS — O99013 Anemia complicating pregnancy, third trimester: Secondary | ICD-10-CM

## 2022-11-27 DIAGNOSIS — O09892 Supervision of other high risk pregnancies, second trimester: Secondary | ICD-10-CM

## 2022-11-27 DIAGNOSIS — O24415 Gestational diabetes mellitus in pregnancy, controlled by oral hypoglycemic drugs: Secondary | ICD-10-CM

## 2022-11-27 NOTE — Progress Notes (Signed)
PRENATAL VISIT NOTE  Subjective:  Tamara Bryant is a 32 y.o. G3P2002 at [redacted]w[redacted]d being seen today for ongoing prenatal care.  She is currently monitored for the following issues for this high-risk pregnancy and has BMI 39.0-39.9,adult; Supervision of high risk pregnancy, antepartum; History of substance use; Short interval between pregnancies affecting pregnancy in second trimester, antepartum; History of gestational diabetes mellitus (GDM); Obesity affecting pregnancy in second trimester; Late prenatal care affecting pregnancy, antepartum; Gestational diabetes mellitus (GDM), antepartum; Anemia affecting pregnancy in third trimester; and Polyhydramnios affecting pregnancy on their problem list.  Patient reports no complaints.  Contractions: Irregular. Vag. Bleeding: None.  Movement: Present. Denies leaking of fluid.   The following portions of the patient's history were reviewed and updated as appropriate: allergies, current medications, past family history, past medical history, past social history, past surgical history and problem list.   Objective:   Vitals:   11/27/22 1326  BP: 110/70  Pulse: 96  Weight: 245 lb 12.8 oz (111.5 kg)    Fetal Status: Fetal Heart Rate (bpm): 135   Movement: Present     General:  Alert, oriented and cooperative. Patient is in no acute distress.  Skin: Skin is warm and dry. No rash noted.   Cardiovascular: Normal heart rate noted  Respiratory: Normal respiratory effort, no problems with respiration noted  Abdomen: Soft, gravid, appropriate for gestational age.  Pain/Pressure: Present     Pelvic: Cervical exam deferred        Extremities: Normal range of motion.  Edema: None  Mental Status: Normal mood and affect. Normal behavior. Normal judgment and thought content.   Assessment and Plan:  Pregnancy: G3P2002 at [redacted]w[redacted]d 1. Supervision of high risk pregnancy, antepartum Up to date Given info about RSV vaccination- Desires vaccine today Patient  expressed desire for BTS-- reviewed this method for pregnancy prevention. Signed consent today  Patient will need IOL prior to 30 days.  Patient interested in IUD bridge to BTS Strongly does not desire pregnancy  2. Gestational diabetes mellitus (GDM) controlled on oral hypoglycemic drug, antepartum Did not bring log and admitted to "not checking that often" Fasting 11/24 80s 2hr PP 11/24 80s She is only checking fasting and after bfast regularly.  ON metformin 500 BID IOL 38-39 weeks-- patient desires 12/20 AM - scheduled today. Orders in signed held.  Consider OP foley on 12/19   3. Anemia affecting pregnancy in third trimester Lab Results  Component Value Date   HGB 10.2 (L) 10/16/2022   HGB 10.6 (L) 10/07/2022    4. Obesity affecting pregnancy in second trimester, unspecified obesity type TWG=15 lb 12.8 oz (7.167 kg)   5. Polyhydramnios affecting pregnancy 11/12 AFI= 32 Weekly testing with MFM  6. Short interval between pregnancies affecting pregnancy in second trimester, antepartum  7. [redacted] weeks gestation of pregnancy   Preterm labor symptoms and general obstetric precautions including but not limited to vaginal bleeding, contractions, leaking of fluid and fetal movement were reviewed in detail with the patient. Please refer to After Visit Summary for other counseling recommendations.   Return in about 2 weeks (around 12/11/2022) for Routine prenatal care, 36wks.  Future Appointments  Date Time Provider Department Center  11/28/2022  1:15 PM [facility] NURSE [facility]  11/28/2022  1:30 PM [facility] US7 [facility]  12/05/2022  1:00 PM [facility] NURSE [facility]  12/05/2022  1:15 PM [facility] NST [facility]  12/12/2022  1:15 PM [facility] NURSE [facility]  12/12/2022  1:30 PM [facility] US4  [facility]  12/12/2022  2:35 PM Celedonio Savage, MD [facility]  12/20/2022 11:15 AM Macon Large, Jethro Bastos, MD [facility]  12/22/2022  6:30 AM [facility] SCHED ROOM [facility] None    Federico Flake, MD

## 2022-11-28 ENCOUNTER — Encounter: Payer: Self-pay | Admitting: *Deleted

## 2022-11-28 ENCOUNTER — Ambulatory Visit: Payer: Medicaid Other

## 2022-11-28 ENCOUNTER — Telehealth: Payer: Self-pay

## 2022-11-28 ENCOUNTER — Other Ambulatory Visit: Payer: Self-pay

## 2022-11-28 ENCOUNTER — Ambulatory Visit: Payer: Medicaid Other | Attending: Maternal & Fetal Medicine | Admitting: *Deleted

## 2022-11-28 VITALS — BP 110/66 | HR 105

## 2022-11-28 DIAGNOSIS — B379 Candidiasis, unspecified: Secondary | ICD-10-CM

## 2022-11-28 DIAGNOSIS — O403XX0 Polyhydramnios, third trimester, not applicable or unspecified: Secondary | ICD-10-CM | POA: Diagnosis not present

## 2022-11-28 DIAGNOSIS — J45909 Unspecified asthma, uncomplicated: Secondary | ICD-10-CM | POA: Diagnosis not present

## 2022-11-28 DIAGNOSIS — O99213 Obesity complicating pregnancy, third trimester: Secondary | ICD-10-CM | POA: Insufficient documentation

## 2022-11-28 DIAGNOSIS — O99013 Anemia complicating pregnancy, third trimester: Secondary | ICD-10-CM

## 2022-11-28 DIAGNOSIS — O24419 Gestational diabetes mellitus in pregnancy, unspecified control: Secondary | ICD-10-CM

## 2022-11-28 DIAGNOSIS — O09893 Supervision of other high risk pregnancies, third trimester: Secondary | ICD-10-CM | POA: Insufficient documentation

## 2022-11-28 DIAGNOSIS — Z3A34 34 weeks gestation of pregnancy: Secondary | ICD-10-CM

## 2022-11-28 DIAGNOSIS — O24415 Gestational diabetes mellitus in pregnancy, controlled by oral hypoglycemic drugs: Secondary | ICD-10-CM | POA: Diagnosis present

## 2022-11-28 DIAGNOSIS — O0930 Supervision of pregnancy with insufficient antenatal care, unspecified trimester: Secondary | ICD-10-CM

## 2022-11-28 DIAGNOSIS — B9689 Other specified bacterial agents as the cause of diseases classified elsewhere: Secondary | ICD-10-CM

## 2022-11-28 DIAGNOSIS — O99513 Diseases of the respiratory system complicating pregnancy, third trimester: Secondary | ICD-10-CM | POA: Diagnosis not present

## 2022-11-28 DIAGNOSIS — O0990 Supervision of high risk pregnancy, unspecified, unspecified trimester: Secondary | ICD-10-CM

## 2022-11-28 DIAGNOSIS — E669 Obesity, unspecified: Secondary | ICD-10-CM

## 2022-11-28 MED ORDER — FLUCONAZOLE 150 MG PO TABS: 150.0000 mg | ORAL_TABLET | Freq: Once | ORAL | 0 refills | Status: AC

## 2022-11-28 MED ORDER — METRONIDAZOLE 500 MG PO TABS: 500.0000 mg | ORAL_TABLET | Freq: Two times a day (BID) | ORAL | 0 refills | Status: DC

## 2022-11-28 NOTE — Telephone Encounter (Signed)
Call received from PCP Sherron Flemings-- requests that our providers treat yeast and BV found on vaginal swab completed 11/21/22. Reviewed in CareEverywhere. Called pt to offer treatement per protocol-- would prefer single Diflucan tablet vs Terazol cream. Sent to pharmacy. Reviewed IOL change to 12/21/22 MN due to no available office appts for Foley bulb insertion on 12/21/22. Will have Foley bulb placed 12/18 1115 AM in office and then present for IOL at 11:45 PM.

## 2022-12-05 ENCOUNTER — Ambulatory Visit: Payer: Medicaid Other

## 2022-12-07 ENCOUNTER — Ambulatory Visit: Payer: Medicaid Other | Attending: Obstetrics and Gynecology | Admitting: *Deleted

## 2022-12-07 ENCOUNTER — Other Ambulatory Visit: Payer: Self-pay | Admitting: *Deleted

## 2022-12-07 ENCOUNTER — Other Ambulatory Visit: Payer: Self-pay

## 2022-12-07 ENCOUNTER — Ambulatory Visit (HOSPITAL_BASED_OUTPATIENT_CLINIC_OR_DEPARTMENT_OTHER): Payer: Medicaid Other

## 2022-12-07 VITALS — BP 113/71 | HR 87

## 2022-12-07 DIAGNOSIS — O24419 Gestational diabetes mellitus in pregnancy, unspecified control: Secondary | ICD-10-CM | POA: Diagnosis present

## 2022-12-07 DIAGNOSIS — Z3A36 36 weeks gestation of pregnancy: Secondary | ICD-10-CM | POA: Diagnosis not present

## 2022-12-07 DIAGNOSIS — O24415 Gestational diabetes mellitus in pregnancy, controlled by oral hypoglycemic drugs: Secondary | ICD-10-CM | POA: Diagnosis not present

## 2022-12-07 DIAGNOSIS — O409XX0 Polyhydramnios, unspecified trimester, not applicable or unspecified: Secondary | ICD-10-CM | POA: Diagnosis not present

## 2022-12-07 DIAGNOSIS — O0930 Supervision of pregnancy with insufficient antenatal care, unspecified trimester: Secondary | ICD-10-CM

## 2022-12-07 DIAGNOSIS — O99013 Anemia complicating pregnancy, third trimester: Secondary | ICD-10-CM

## 2022-12-07 DIAGNOSIS — O0990 Supervision of high risk pregnancy, unspecified, unspecified trimester: Secondary | ICD-10-CM

## 2022-12-07 NOTE — Procedures (Signed)
Tamara Bryant [birth year] [redacted]w[redacted]d  Fetus A Non-Stress Test Interpretation for 12/07/22  Indication: Gestational Diabetes medication controlled  Fetal Heart Rate A Mode: External Baseline Rate (A): 135 bpm Variability: Minimal, Moderate Accelerations: 10 x 10 (does not meet criteria for reactive NST) Decelerations: None Multiple birth?: No  Uterine Activity Mode: Palpation, Toco Contraction Frequency (min): NONE Resting Tone Palpated: Relaxed  Interpretation (Fetal Testing) Nonstress Test Interpretation: Non-reactive Overall Impression: Reassuring for gestational age Comments: Dr. Parke Poisson reviewed tracing. Patient unable to stay-had to go pick up her children. She will return tomorrow for repeat NST.

## 2022-12-08 ENCOUNTER — Ambulatory Visit: Payer: Medicaid Other

## 2022-12-08 ENCOUNTER — Other Ambulatory Visit: Payer: Self-pay | Admitting: Obstetrics

## 2022-12-08 ENCOUNTER — Other Ambulatory Visit: Payer: Self-pay | Admitting: *Deleted

## 2022-12-08 ENCOUNTER — Ambulatory Visit: Payer: Medicaid Other | Attending: Obstetrics | Admitting: *Deleted

## 2022-12-08 ENCOUNTER — Ambulatory Visit: Payer: Medicaid Other | Attending: Obstetrics | Admitting: Obstetrics

## 2022-12-08 VITALS — BP 111/73

## 2022-12-08 DIAGNOSIS — O24415 Gestational diabetes mellitus in pregnancy, controlled by oral hypoglycemic drugs: Secondary | ICD-10-CM

## 2022-12-08 DIAGNOSIS — Z3A36 36 weeks gestation of pregnancy: Secondary | ICD-10-CM

## 2022-12-08 DIAGNOSIS — O403XX0 Polyhydramnios, third trimester, not applicable or unspecified: Secondary | ICD-10-CM

## 2022-12-08 DIAGNOSIS — O288 Other abnormal findings on antenatal screening of mother: Secondary | ICD-10-CM | POA: Diagnosis not present

## 2022-12-08 DIAGNOSIS — O99213 Obesity complicating pregnancy, third trimester: Secondary | ICD-10-CM

## 2022-12-08 DIAGNOSIS — O409XX0 Polyhydramnios, unspecified trimester, not applicable or unspecified: Secondary | ICD-10-CM

## 2022-12-08 DIAGNOSIS — O24419 Gestational diabetes mellitus in pregnancy, unspecified control: Secondary | ICD-10-CM | POA: Diagnosis present

## 2022-12-08 DIAGNOSIS — O0933 Supervision of pregnancy with insufficient antenatal care, third trimester: Secondary | ICD-10-CM

## 2022-12-08 DIAGNOSIS — E669 Obesity, unspecified: Secondary | ICD-10-CM | POA: Diagnosis not present

## 2022-12-08 DIAGNOSIS — J45909 Unspecified asthma, uncomplicated: Secondary | ICD-10-CM

## 2022-12-08 DIAGNOSIS — O99513 Diseases of the respiratory system complicating pregnancy, third trimester: Secondary | ICD-10-CM

## 2022-12-08 DIAGNOSIS — O403XX1 Polyhydramnios, third trimester, fetus 1: Secondary | ICD-10-CM | POA: Diagnosis not present

## 2022-12-08 DIAGNOSIS — O09899 Supervision of other high risk pregnancies, unspecified trimester: Secondary | ICD-10-CM

## 2022-12-08 NOTE — Progress Notes (Signed)
MFM Note  Tamara Bryant is currently at 36 weeks and 2 days.  She returned for an NST this morning after she had a nonreactive NST yesterday.  The patient could not stay for a BPP yesterday as she had to go pick up her child from school.    Her NST was nonreactive again this morning.  She reports feeling fetal movements throughout the day.   Her pregnancy has been complicated by maternal obesity with a BMI of 39 and gestational diabetes treated with metformin.  The patient reports that she has stopped taking metformin and has not checked her fingerstick values for the past few days.  Polyhydramnios was noted during her prior ultrasound exams.   A biophysical profile performed today was 8/10 with a nonreactive NST.  Fetal movements and fetal breathing movements were noted throughout today's ultrasound exam.    As she has had 2 nonreactive NSTs in a row, she was offered and declined to go to the hospital today for prolonged monitoring.  Polyhydramnios with a total AFI of 30.42 cm continues to be noted today.  The patient was encouraged to continue taking metformin as prescribed and to continue checking her fingerstick values to avoid an adverse pregnancy outcomes such as stillbirth.    The patient was advised that uncontrolled diabetes in pregnancy may have contributed to the polyhydramnios that continues to be noted on her ultrasound exams.  She also understands that uncontrolled diabetes in pregnancy may lead to an adverse pregnancy outcome such as stillbirth.  Due to polyhydramnios and potentially uncontrolled gestational diabetes, an induction of labor may be considered at around 37 weeks (next week).    The patient is currently scheduled for induction on December 20, 2022.  The patient will think about being delivered next week and will call me should she be willing to be delivered next week.    She will return early next week for another BPP and growth scan.    The patient was encouraged  to continue to check monitoring her fingerstick values and to take metformin as prescribed.  Fetal kick count instructions were reviewed today.    The patient and her partner stated that all of their questions were answered today.  A total of 20 minutes was spent counseling and coordinating the care for this patient.  Greater than 50% of the time was spent in direct face-to-face contact.

## 2022-12-08 NOTE — Procedures (Signed)
SARSHA HOFMANN May 22, 1990 [redacted]w[redacted]d  Fetus A Non-Stress Test Interpretation for 12/08/22-NST only  Indication: Gestational Diabetes medication controlled, Polyhydramnios, and polyhydramnios, Failed NST yesterday and today  Fetal Heart Rate A Mode: External Baseline Rate (A): 140 bpm Variability: Minimal, Moderate Accelerations: 10 x 10 (1 10 x 10 accel during the extended 50 min NST) Decelerations: None Multiple birth?: No  Uterine Activity Mode: Toco Contraction Frequency (min): 5-8 Contraction Duration (sec): 70-90 Contraction Quality: Moderate (Pt rates them a "7") Resting Tone Palpated: Relaxed  Interpretation (Fetal Testing) Nonstress Test Interpretation: Non-reactive Comments: Tracing reviewed by Dr. Parke Poisson He recommended pt go to hosptial for prolonged monitoring and BPP, then evaluate.. ; she doesnt want to do that.

## 2022-12-12 ENCOUNTER — Encounter: Payer: Self-pay | Admitting: *Deleted

## 2022-12-12 ENCOUNTER — Ambulatory Visit: Payer: Medicaid Other | Admitting: Family Medicine

## 2022-12-12 ENCOUNTER — Ambulatory Visit: Payer: Medicaid Other | Attending: Maternal & Fetal Medicine | Admitting: *Deleted

## 2022-12-12 ENCOUNTER — Other Ambulatory Visit (HOSPITAL_COMMUNITY)
Admission: RE | Admit: 2022-12-12 | Discharge: 2022-12-12 | Disposition: A | Discharge status: Home / Self Care | Payer: Medicaid Other | Source: Ambulatory Visit | Attending: Family Medicine | Admitting: Family Medicine

## 2022-12-12 ENCOUNTER — Other Ambulatory Visit: Payer: Self-pay

## 2022-12-12 ENCOUNTER — Ambulatory Visit: Payer: Medicaid Other

## 2022-12-12 VITALS — BP 95/66 | HR 94

## 2022-12-12 VITALS — BP 94/69 | HR 81 | Wt 246.6 lb

## 2022-12-12 DIAGNOSIS — E669 Obesity, unspecified: Secondary | ICD-10-CM

## 2022-12-12 DIAGNOSIS — O99213 Obesity complicating pregnancy, third trimester: Secondary | ICD-10-CM | POA: Diagnosis not present

## 2022-12-12 DIAGNOSIS — O99212 Obesity complicating pregnancy, second trimester: Secondary | ICD-10-CM

## 2022-12-12 DIAGNOSIS — O24415 Gestational diabetes mellitus in pregnancy, controlled by oral hypoglycemic drugs: Secondary | ICD-10-CM

## 2022-12-12 DIAGNOSIS — O99513 Diseases of the respiratory system complicating pregnancy, third trimester: Secondary | ICD-10-CM

## 2022-12-12 DIAGNOSIS — O403XX0 Polyhydramnios, third trimester, not applicable or unspecified: Secondary | ICD-10-CM | POA: Diagnosis not present

## 2022-12-12 DIAGNOSIS — Z3A36 36 weeks gestation of pregnancy: Secondary | ICD-10-CM | POA: Insufficient documentation

## 2022-12-12 DIAGNOSIS — O409XX0 Polyhydramnios, unspecified trimester, not applicable or unspecified: Secondary | ICD-10-CM

## 2022-12-12 DIAGNOSIS — O0990 Supervision of high risk pregnancy, unspecified, unspecified trimester: Secondary | ICD-10-CM | POA: Diagnosis present

## 2022-12-12 DIAGNOSIS — D649 Anemia, unspecified: Secondary | ICD-10-CM

## 2022-12-12 DIAGNOSIS — O09892 Supervision of other high risk pregnancies, second trimester: Secondary | ICD-10-CM

## 2022-12-12 DIAGNOSIS — O0930 Supervision of pregnancy with insufficient antenatal care, unspecified trimester: Secondary | ICD-10-CM

## 2022-12-12 DIAGNOSIS — J45909 Unspecified asthma, uncomplicated: Secondary | ICD-10-CM

## 2022-12-12 DIAGNOSIS — O99013 Anemia complicating pregnancy, third trimester: Secondary | ICD-10-CM

## 2022-12-12 NOTE — Progress Notes (Signed)
   PRENATAL VISIT NOTE  Subjective:  Tamara Bryant is a 32 y.o. G3P2002 at [redacted]w[redacted]d being seen today for ongoing prenatal care.  She is currently monitored for the following issues for this high-risk pregnancy and has BMI 39.0-39.9,adult; Supervision of high risk pregnancy, antepartum; History of substance use; Short interval between pregnancies affecting pregnancy in second trimester, antepartum; History of gestational diabetes mellitus (GDM); Obesity affecting pregnancy in second trimester; Late prenatal care affecting pregnancy, antepartum; Gestational diabetes mellitus (GDM), antepartum; Anemia affecting pregnancy in third trimester; and Polyhydramnios affecting pregnancy on their problem list.  Patient reports no cramping, no leaking, occasional contractions, and vaginal irritation.  Contractions: Irritability. Vag. Bleeding: None.  Movement: Present. Denies leaking of fluid.   The following portions of the patient's history were reviewed and updated as appropriate: allergies, current medications, past family history, past medical history, past social history, past surgical history and problem list.   Objective:   Vitals:   12/12/22 1509  BP: 94/69  Pulse: 81  Weight: 246 lb 9 oz (111.8 kg)    Fetal Status: Fetal Heart Rate (bpm): 138   Movement: Present     General:  Alert, oriented and cooperative. Patient is in no acute distress.  Skin: Skin is warm and dry. No rash noted.   Cardiovascular: Normal heart rate noted  Respiratory: Normal respiratory effort, no problems with respiration noted  Abdomen: Soft, gravid, appropriate for gestational age.  Pain/Pressure: Present     Pelvic: Cervical exam performed in the presence of a chaperone      close/thick/-2  Extremities: Normal range of motion.  Edema: Trace  Mental Status: Normal mood and affect. Normal behavior. Normal judgment and thought content.   Assessment and Plan:  Pregnancy: G3P2002 at [redacted]w[redacted]d 1. Supervision of high risk  pregnancy, antepartum FHR and BP appropriate today - Cervicovaginal ancillary only - Culture, beta strep (group b only)  2. Gestational diabetes mellitus (GDM) controlled on oral hypoglycemic drug, antepartum Patient reports that she has not been taking her blood sugars and is not taking her metformin either Patient saw MFM today who recommended induction at 37 weeks for uncontrolled gestational diabetes.  Patient declined induction. Had further discussion with patient regarding risks of continuing pregnancy with uncontrolled diabetes but patient declines at induction at 37 weeks and will wait until 12/18 at midnight.  3. Anemia affecting pregnancy in third trimester Prescribed iron  4. Obesity affecting pregnancy in second trimester, unspecified obesity type  5. Polyhydramnios affecting pregnancy AFI 26.2 today  6. Short interval between pregnancies affecting pregnancy in second trimester, antepartum Patient would like BTL if goes to surgery.  Will use interval IUD until she can have tubal ligation  7. [redacted] weeks gestation of pregnancy   Preterm labor symptoms and general obstetric precautions including but not limited to vaginal bleeding, contractions, leaking of fluid and fetal movement were reviewed in detail with the patient. Please refer to After Visit Summary for other counseling recommendations.   No follow-ups on file.  Future Appointments  Date Time Provider Department Center  12/19/2022  1:15 PM [facility] NST [facility]  12/20/2022 11:15 AM Anyanwu, Jethro Bastos, MD [facility]  12/21/2022 12:00 AM [facility] SCHED ROOM [facility] None    Celedonio Savage, MD

## 2022-12-13 ENCOUNTER — Other Ambulatory Visit: Payer: Self-pay | Admitting: Advanced Practice Midwife

## 2022-12-13 LAB — CERVICOVAGINAL ANCILLARY ONLY
Chlamydia: NEGATIVE
Comment: NEGATIVE
Comment: NORMAL
Neisseria Gonorrhea: NEGATIVE

## 2022-12-14 ENCOUNTER — Telehealth (HOSPITAL_COMMUNITY): Payer: Self-pay | Admitting: *Deleted

## 2022-12-14 NOTE — Telephone Encounter (Signed)
Preadmission screen  

## 2022-12-15 LAB — CULTURE, BETA STREP (GROUP B ONLY): Strep Gp B Culture: NEGATIVE

## 2022-12-17 ENCOUNTER — Other Ambulatory Visit: Payer: Self-pay | Admitting: Advanced Practice Midwife

## 2022-12-18 ENCOUNTER — Telehealth (HOSPITAL_COMMUNITY): Payer: Self-pay | Admitting: *Deleted

## 2022-12-18 NOTE — Telephone Encounter (Signed)
Preadmission screen  

## 2022-12-19 ENCOUNTER — Other Ambulatory Visit: Payer: Self-pay

## 2022-12-19 ENCOUNTER — Ambulatory Visit: Payer: Medicaid Other | Attending: Obstetrics | Admitting: *Deleted

## 2022-12-19 ENCOUNTER — Telehealth (HOSPITAL_COMMUNITY): Payer: Self-pay | Admitting: *Deleted

## 2022-12-19 DIAGNOSIS — O24415 Gestational diabetes mellitus in pregnancy, controlled by oral hypoglycemic drugs: Secondary | ICD-10-CM

## 2022-12-19 DIAGNOSIS — O409XX0 Polyhydramnios, unspecified trimester, not applicable or unspecified: Secondary | ICD-10-CM | POA: Diagnosis not present

## 2022-12-19 DIAGNOSIS — Z3A37 37 weeks gestation of pregnancy: Secondary | ICD-10-CM | POA: Insufficient documentation

## 2022-12-19 DIAGNOSIS — O24419 Gestational diabetes mellitus in pregnancy, unspecified control: Secondary | ICD-10-CM | POA: Diagnosis present

## 2022-12-19 DIAGNOSIS — O09893 Supervision of other high risk pregnancies, third trimester: Secondary | ICD-10-CM | POA: Insufficient documentation

## 2022-12-19 NOTE — Telephone Encounter (Signed)
Preadmission screen  

## 2022-12-19 NOTE — Procedures (Signed)
Tamara Bryant [birth year] [redacted]w[redacted]d  Fetus A Non-Stress Test Interpretation for 12/19/22-NST only  Indication: Gestational Diabetes medication controlled and Polyhydramnios  Fetal Heart Rate A Mode: External Baseline Rate (A): 145 bpm Variability: Moderate Accelerations: 15 x 15 Decelerations: None Multiple birth?: No  Uterine Activity Mode: Toco Contraction Frequency (min): occas Contraction Duration (sec): 70 Contraction Quality: Mild Resting Tone Palpated: Relaxed  Interpretation (Fetal Testing) Nonstress Test Interpretation: Reactive Comments: Tracing reviewed by Dr. Parke Poisson

## 2022-12-20 ENCOUNTER — Other Ambulatory Visit: Payer: Self-pay | Admitting: Advanced Practice Midwife

## 2022-12-20 ENCOUNTER — Encounter: Payer: Self-pay | Admitting: Obstetrics & Gynecology

## 2022-12-20 ENCOUNTER — Ambulatory Visit (INDEPENDENT_AMBULATORY_CARE_PROVIDER_SITE_OTHER): Payer: Medicaid Other | Admitting: Obstetrics & Gynecology

## 2022-12-20 VITALS — BP 112/82 | HR 93 | Wt 248.0 lb

## 2022-12-20 DIAGNOSIS — O403XX0 Polyhydramnios, third trimester, not applicable or unspecified: Secondary | ICD-10-CM

## 2022-12-20 DIAGNOSIS — O0990 Supervision of high risk pregnancy, unspecified, unspecified trimester: Secondary | ICD-10-CM

## 2022-12-20 DIAGNOSIS — O24419 Gestational diabetes mellitus in pregnancy, unspecified control: Secondary | ICD-10-CM

## 2022-12-20 DIAGNOSIS — Z3A38 38 weeks gestation of pregnancy: Secondary | ICD-10-CM | POA: Diagnosis not present

## 2022-12-20 DIAGNOSIS — O409XX0 Polyhydramnios, unspecified trimester, not applicable or unspecified: Secondary | ICD-10-CM

## 2022-12-20 NOTE — Progress Notes (Signed)
    PRENATAL VISIT NOTE  Subjective:  Tamara Bryant is a 32 y.o. G3P2002 at [redacted]w[redacted]d being seen today for ongoing prenatal care and for foley bulb placement prior to scheduled IOL on 12/21/22 midnight.  She is currently monitored for the following issues for this high-risk pregnancy and has BMI 39.0-39.9,adult; Supervision of high risk pregnancy, antepartum; History of substance use; Short interval between pregnancies affecting pregnancy in second trimester, antepartum; Obesity affecting pregnancy in second trimester; Late prenatal care affecting pregnancy, antepartum; Gestational diabetes mellitus (GDM), antepartum; Anemia affecting pregnancy in third trimester; and Polyhydramnios affecting pregnancy on their problem list.  Patient reports occasional contractions.  Contractions: Irritability. Vag. Bleeding: None.  Movement: Present. Denies leaking of fluid.   The following portions of the patient's history were reviewed and updated as appropriate: allergies, current medications, past family history, past medical history, past social history, past surgical history and problem list. Problem list updated.  Objective:   Vitals:   12/20/22 1131  BP: 112/82  Pulse: 93  Weight: 248 lb (112.5 kg)    Fetal Status: Fetal Heart Rate (bpm): 145   Movement: Present  Presentation: Vertex  General:  Alert, oriented and cooperative. Patient is in no acute distress.  Skin: Skin is warm and dry. No rash noted.   Cardiovascular: Normal heart rate noted  Respiratory: Normal respiratory effort, no problems with respiration noted  Abdomen: Soft, gravid, appropriate for gestational age.  Pain/Pressure: Present     Pelvic: Cervical exam performed in presence of RN as a chaperoneDilation: 1 Effacement (%): 50 Station: -3  Extremities: Normal range of motion.  Edema: None  Mental Status:  Normal mood and affect. Normal behavior. Normal judgment and thought content.   Foley Bulb Placement Procedure: Patient  informed of Risks, benefits, and alternatives of foley bulb placement.  Procedure done to begin ripening of the cervix prior to admission for induction of labor. Appropriate time out taken. The patient was placed in the lithotomy position and the cervix brought into view with sterile speculum. A ring forcep was used to guide the 9F foley through the internal os of the cervix. Foley Balloon filled with  40 cc of normal saline, patient unable to tolerate any more fluid.  Plug inserted into end of the foley. Foley placed on tension and taped to medial thigh. All equipment was removed and accounted for. The patient tolerated the procedure well.   NST:  EFM Baseline: 130 bpm, Variability: Moderate, Accelerations: Reactive, and Decelerations: Absent  Toco: irregular There were no signs of tachysystole or hypertonus.   Assessment and Plan:  Pregnancy: G3P2002 at [redacted]w[redacted]d 1. Gestational diabetes mellitus (GDM) in third trimester (Primary) 2. Polyhydramnios affecting pregnancy 3. [redacted] weeks gestation of pregnancy 4. Supervision of high risk pregnancy, antepartum IOL scheduled 12/21/2022 midnight, will arrive at 11:45 pm tonight. S/p Outpatient placement of foley balloon catheter for cervical ripening.  Category 1 FHR tracing after placement with no concerns at present. Warning signs given to patient to include return to MAU for heavy vaginal bleeding, rupture of membranes, painful uterine contractions every 5 mins or less, severe abdominal discomfort, decreased fetal movement or any other concerns.  Return for Postpartum follow up.   Jaynie Collins, MD 12/20/2022

## 2022-12-21 ENCOUNTER — Inpatient Hospital Stay (HOSPITAL_COMMUNITY): Payer: Medicaid Other | Admitting: Anesthesiology

## 2022-12-21 ENCOUNTER — Inpatient Hospital Stay (HOSPITAL_COMMUNITY): Payer: Medicaid Other

## 2022-12-21 ENCOUNTER — Inpatient Hospital Stay (HOSPITAL_COMMUNITY)
Admission: RE | Admit: 2022-12-21 | Discharge: 2022-12-23 | DRG: 806 | Disposition: A | Discharge status: Home / Self Care | Payer: Medicaid Other | Attending: Family Medicine | Admitting: Family Medicine

## 2022-12-21 ENCOUNTER — Encounter (HOSPITAL_COMMUNITY): Payer: Self-pay | Admitting: Family Medicine

## 2022-12-21 ENCOUNTER — Other Ambulatory Visit: Payer: Self-pay

## 2022-12-21 DIAGNOSIS — O24419 Gestational diabetes mellitus in pregnancy, unspecified control: Principal | ICD-10-CM | POA: Diagnosis present

## 2022-12-21 DIAGNOSIS — O9081 Anemia of the puerperium: Secondary | ICD-10-CM | POA: Diagnosis not present

## 2022-12-21 DIAGNOSIS — Z3A38 38 weeks gestation of pregnancy: Secondary | ICD-10-CM

## 2022-12-21 DIAGNOSIS — D62 Acute posthemorrhagic anemia: Secondary | ICD-10-CM | POA: Diagnosis not present

## 2022-12-21 DIAGNOSIS — O24424 Gestational diabetes mellitus in childbirth, insulin controlled: Secondary | ICD-10-CM | POA: Diagnosis not present

## 2022-12-21 DIAGNOSIS — O99013 Anemia complicating pregnancy, third trimester: Secondary | ICD-10-CM

## 2022-12-21 DIAGNOSIS — Z87898 Personal history of other specified conditions: Secondary | ICD-10-CM

## 2022-12-21 DIAGNOSIS — O322XX0 Maternal care for transverse and oblique lie, not applicable or unspecified: Secondary | ICD-10-CM | POA: Diagnosis present

## 2022-12-21 DIAGNOSIS — Z832 Family history of diseases of the blood and blood-forming organs and certain disorders involving the immune mechanism: Secondary | ICD-10-CM

## 2022-12-21 DIAGNOSIS — Z8249 Family history of ischemic heart disease and other diseases of the circulatory system: Secondary | ICD-10-CM | POA: Diagnosis not present

## 2022-12-21 DIAGNOSIS — Z6839 Body mass index (BMI) 39.0-39.9, adult: Secondary | ICD-10-CM

## 2022-12-21 DIAGNOSIS — Z23 Encounter for immunization: Secondary | ICD-10-CM | POA: Diagnosis not present

## 2022-12-21 DIAGNOSIS — O0990 Supervision of high risk pregnancy, unspecified, unspecified trimester: Secondary | ICD-10-CM

## 2022-12-21 DIAGNOSIS — O99334 Smoking (tobacco) complicating childbirth: Secondary | ICD-10-CM | POA: Diagnosis present

## 2022-12-21 DIAGNOSIS — O0930 Supervision of pregnancy with insufficient antenatal care, unspecified trimester: Secondary | ICD-10-CM

## 2022-12-21 DIAGNOSIS — Z833 Family history of diabetes mellitus: Secondary | ICD-10-CM | POA: Diagnosis not present

## 2022-12-21 DIAGNOSIS — O99214 Obesity complicating childbirth: Secondary | ICD-10-CM | POA: Diagnosis not present

## 2022-12-21 DIAGNOSIS — O24425 Gestational diabetes mellitus in childbirth, controlled by oral hypoglycemic drugs: Secondary | ICD-10-CM | POA: Diagnosis present

## 2022-12-21 DIAGNOSIS — F17210 Nicotine dependence, cigarettes, uncomplicated: Secondary | ICD-10-CM | POA: Diagnosis present

## 2022-12-21 DIAGNOSIS — O409XX0 Polyhydramnios, unspecified trimester, not applicable or unspecified: Secondary | ICD-10-CM

## 2022-12-21 DIAGNOSIS — O09892 Supervision of other high risk pregnancies, second trimester: Secondary | ICD-10-CM

## 2022-12-21 DIAGNOSIS — O403XX0 Polyhydramnios, third trimester, not applicable or unspecified: Secondary | ICD-10-CM | POA: Diagnosis not present

## 2022-12-21 DIAGNOSIS — Z30430 Encounter for insertion of intrauterine contraceptive device: Secondary | ICD-10-CM

## 2022-12-21 DIAGNOSIS — O24415 Gestational diabetes mellitus in pregnancy, controlled by oral hypoglycemic drugs: Secondary | ICD-10-CM

## 2022-12-21 LAB — CBC
HCT: 33.7 % — ABNORMAL LOW (ref 36.0–46.0)
Hemoglobin: 11.3 g/dL — ABNORMAL LOW (ref 12.0–15.0)
MCH: 27.8 pg (ref 26.0–34.0)
MCHC: 33.5 g/dL (ref 30.0–36.0)
MCV: 82.8 fL (ref 80.0–100.0)
Platelets: 310 10*3/uL (ref 150–400)
RBC: 4.07 MIL/uL (ref 3.87–5.11)
RDW: 15.1 % (ref 11.5–15.5)
WBC: 10.9 10*3/uL — ABNORMAL HIGH (ref 4.0–10.5)
nRBC: 0 % (ref 0.0–0.2)

## 2022-12-21 LAB — TYPE AND SCREEN
ABO/RH(D): A POS
Antibody Screen: NEGATIVE

## 2022-12-21 LAB — GLUCOSE, CAPILLARY
Glucose-Capillary: 71 mg/dL (ref 70–99)
Glucose-Capillary: 74 mg/dL (ref 70–99)
Glucose-Capillary: 84 mg/dL (ref 70–99)
Glucose-Capillary: 93 mg/dL (ref 70–99)
Glucose-Capillary: 94 mg/dL (ref 70–99)

## 2022-12-21 LAB — RPR: RPR Ser Ql: NONREACTIVE

## 2022-12-21 MED ORDER — PRENATAL MULTIVITAMIN CH
1.0000 | ORAL_TABLET | Freq: Every day | ORAL | Status: DC
  Administered 2022-12-22 – 2022-12-23 (×2): 1 via ORAL
  Filled 2022-12-21 (×2): qty 1

## 2022-12-21 MED ORDER — LIDOCAINE HCL (PF) 1 % IJ SOLN
INTRAMUSCULAR | Status: DC | PRN
  Administered 2022-12-21: 8 mL via EPIDURAL
  Administered 2022-12-21: 2 mL via EPIDURAL

## 2022-12-21 MED ORDER — SODIUM CHLORIDE 0.9% FLUSH: 3.0000 mL | INTRAVENOUS | Status: DC | PRN

## 2022-12-21 MED ORDER — FENTANYL-BUPIVACAINE-NACL 0.5-0.125-0.9 MG/250ML-% EP SOLN
12.0000 mL/h | EPIDURAL | Status: DC | PRN
  Filled 2022-12-21: qty 250

## 2022-12-21 MED ORDER — TERBUTALINE SULFATE 1 MG/ML IJ SOLN: 0.2500 mg | Freq: Once | INTRAMUSCULAR | Status: DC | PRN

## 2022-12-21 MED ORDER — ONDANSETRON HCL 4 MG/2ML IJ SOLN: 4.0000 mg | INTRAMUSCULAR | Status: DC | PRN

## 2022-12-21 MED ORDER — ZOLPIDEM TARTRATE 5 MG PO TABS: 5.0000 mg | ORAL_TABLET | Freq: Every evening | ORAL | Status: DC | PRN

## 2022-12-21 MED ORDER — IBUPROFEN 600 MG PO TABS
600.0000 mg | ORAL_TABLET | Freq: Four times a day (QID) | ORAL | Status: DC
  Administered 2022-12-22 – 2022-12-23 (×7): 600 mg via ORAL
  Filled 2022-12-21 (×7): qty 1

## 2022-12-21 MED ORDER — SOD CITRATE-CITRIC ACID 500-334 MG/5ML PO SOLN: 30.0000 mL | ORAL | Status: DC | PRN

## 2022-12-21 MED ORDER — ACETAMINOPHEN 325 MG PO TABS
650.0000 mg | ORAL_TABLET | ORAL | Status: DC | PRN
  Administered 2022-12-21: 650 mg via ORAL
  Filled 2022-12-21: qty 2

## 2022-12-21 MED ORDER — DIPHENHYDRAMINE HCL 25 MG PO CAPS
25.0000 mg | ORAL_CAPSULE | Freq: Four times a day (QID) | ORAL | Status: DC | PRN
  Administered 2022-12-22: 25 mg via ORAL
  Filled 2022-12-21: qty 1

## 2022-12-21 MED ORDER — MISOPROSTOL 25 MCG QUARTER TABLET: 25.0000 ug | ORAL_TABLET | Freq: Once | ORAL | Status: DC

## 2022-12-21 MED ORDER — EPHEDRINE 5 MG/ML INJ: 10.0000 mg | INTRAVENOUS | Status: DC | PRN

## 2022-12-21 MED ORDER — SODIUM CHLORIDE 0.9 % IV SOLN
250.0000 mL | INTRAVENOUS | Status: DC | PRN
Start: 2022-12-21 — End: 2022-12-23

## 2022-12-21 MED ORDER — LACTATED RINGERS IV SOLN
500.0000 mL | Freq: Once | INTRAVENOUS | Status: AC
Start: 2022-12-21 — End: 2022-12-21
  Administered 2022-12-21: 500 mL via INTRAVENOUS

## 2022-12-21 MED ORDER — LACTATED RINGERS IV SOLN: INTRAVENOUS | Status: DC

## 2022-12-21 MED ORDER — WITCH HAZEL-GLYCERIN EX PADS: 1.0000 | MEDICATED_PAD | CUTANEOUS | Status: DC | PRN

## 2022-12-21 MED ORDER — NICOTINE 14 MG/24HR TD PT24
14.0000 mg | MEDICATED_PATCH | Freq: Every day | TRANSDERMAL | Status: DC
  Administered 2022-12-21: 14 mg via TRANSDERMAL
  Filled 2022-12-21 (×3): qty 1

## 2022-12-21 MED ORDER — LEVONORGESTREL 20 MCG/DAY IU IUD
1.0000 | INTRAUTERINE_SYSTEM | Freq: Once | INTRAUTERINE | Status: AC
  Administered 2022-12-21: 1 via INTRAUTERINE
  Filled 2022-12-21: qty 1

## 2022-12-21 MED ORDER — OXYTOCIN-SODIUM CHLORIDE 30-0.9 UT/500ML-% IV SOLN
2.5000 [IU]/h | INTRAVENOUS | Status: DC
  Administered 2022-12-21: 2.5 [IU]/h via INTRAVENOUS
  Filled 2022-12-21 (×2): qty 500

## 2022-12-21 MED ORDER — SENNOSIDES-DOCUSATE SODIUM 8.6-50 MG PO TABS
2.0000 | ORAL_TABLET | ORAL | Status: DC
  Administered 2022-12-22 – 2022-12-23 (×2): 2 via ORAL
  Filled 2022-12-21 (×2): qty 2

## 2022-12-21 MED ORDER — OXYCODONE-ACETAMINOPHEN 5-325 MG PO TABS: 1.0000 | ORAL_TABLET | ORAL | Status: DC | PRN

## 2022-12-21 MED ORDER — PHENYLEPHRINE 80 MCG/ML (10ML) SYRINGE FOR IV PUSH (FOR BLOOD PRESSURE SUPPORT): 80.0000 ug | PREFILLED_SYRINGE | INTRAVENOUS | Status: DC | PRN

## 2022-12-21 MED ORDER — COCONUT OIL OIL: 1.0000 | TOPICAL_OIL | Status: DC | PRN

## 2022-12-21 MED ORDER — ONDANSETRON HCL 4 MG PO TABS
4.0000 mg | ORAL_TABLET | ORAL | Status: DC | PRN
Start: 2022-12-21 — End: 2022-12-23

## 2022-12-21 MED ORDER — OXYCODONE-ACETAMINOPHEN 5-325 MG PO TABS: 2.0000 | ORAL_TABLET | ORAL | Status: DC | PRN

## 2022-12-21 MED ORDER — LIDOCAINE HCL (PF) 1 % IJ SOLN: 30.0000 mL | INTRAMUSCULAR | Status: DC | PRN

## 2022-12-21 MED ORDER — SIMETHICONE 80 MG PO CHEW: 80.0000 mg | CHEWABLE_TABLET | ORAL | Status: DC | PRN

## 2022-12-21 MED ORDER — LACTATED RINGERS IV SOLN: 500.0000 mL | INTRAVENOUS | Status: DC | PRN

## 2022-12-21 MED ORDER — BENZOCAINE-MENTHOL 20-0.5 % EX AERO: 1.0000 | INHALATION_SPRAY | CUTANEOUS | Status: DC | PRN

## 2022-12-21 MED ORDER — FENTANYL-BUPIVACAINE-NACL 0.5-0.125-0.9 MG/250ML-% EP SOLN
EPIDURAL | Status: DC | PRN
  Administered 2022-12-21: 12 mL/h via EPIDURAL

## 2022-12-21 MED ORDER — OXYTOCIN-SODIUM CHLORIDE 30-0.9 UT/500ML-% IV SOLN
1.0000 m[IU]/min | INTRAVENOUS | Status: DC
Start: 2022-12-21 — End: 2022-12-21
  Administered 2022-12-21 (×2): 2 m[IU]/min via INTRAVENOUS

## 2022-12-21 MED ORDER — ACETAMINOPHEN 325 MG PO TABS
650.0000 mg | ORAL_TABLET | ORAL | Status: DC | PRN
Start: 2022-12-21 — End: 2022-12-23
  Administered 2022-12-22 – 2022-12-23 (×3): 650 mg via ORAL
  Filled 2022-12-21 (×3): qty 2

## 2022-12-21 MED ORDER — ONDANSETRON HCL 4 MG/2ML IJ SOLN
4.0000 mg | Freq: Four times a day (QID) | INTRAMUSCULAR | Status: DC | PRN
  Administered 2022-12-21: 4 mg via INTRAVENOUS
  Filled 2022-12-21: qty 2

## 2022-12-21 MED ORDER — DIBUCAINE (PERIANAL) 1 % EX OINT: 1.0000 | TOPICAL_OINTMENT | CUTANEOUS | Status: DC | PRN

## 2022-12-21 MED ORDER — SODIUM CHLORIDE 0.9% FLUSH: 3.0000 mL | Freq: Two times a day (BID) | INTRAVENOUS | Status: DC

## 2022-12-21 MED ORDER — TERBUTALINE SULFATE 1 MG/ML IJ SOLN
0.2500 mg | Freq: Once | INTRAMUSCULAR | Status: AC | PRN
  Administered 2022-12-21: 0.25 mg via SUBCUTANEOUS
  Filled 2022-12-21: qty 1

## 2022-12-21 MED ORDER — MEASLES, MUMPS & RUBELLA VAC IJ SOLR
0.5000 mL | Freq: Once | INTRAMUSCULAR | Status: AC
  Administered 2022-12-23: 0.5 mL via SUBCUTANEOUS

## 2022-12-21 MED ORDER — TERBUTALINE SULFATE 1 MG/ML IJ SOLN: 0.2500 mg | Freq: Once | INTRAMUSCULAR | Status: DC

## 2022-12-21 MED ORDER — DIPHENHYDRAMINE HCL 50 MG/ML IJ SOLN
12.5000 mg | INTRAMUSCULAR | Status: DC | PRN
  Administered 2022-12-21: 12.5 mg via INTRAVENOUS
  Filled 2022-12-21: qty 1

## 2022-12-21 MED ORDER — MISOPROSTOL 50MCG HALF TABLET: 50.0000 ug | ORAL_TABLET | Freq: Once | ORAL | Status: DC

## 2022-12-21 MED ORDER — PHENYLEPHRINE 80 MCG/ML (10ML) SYRINGE FOR IV PUSH (FOR BLOOD PRESSURE SUPPORT)
80.0000 ug | PREFILLED_SYRINGE | INTRAVENOUS | Status: DC | PRN
Start: 2022-12-21 — End: 2022-12-21

## 2022-12-21 MED ORDER — OXYTOCIN BOLUS FROM INFUSION
333.0000 mL | Freq: Once | INTRAVENOUS | Status: AC
  Administered 2022-12-21: 333 mL via INTRAVENOUS

## 2022-12-21 NOTE — H&P (Addendum)
OBSTETRIC ADMISSION HISTORY AND PHYSICAL  Tamara Bryant is a 32 y.o. female G74P2002 with IUP at [redacted]w[redacted]d by LMP presenting for IOL due to A2GDM. She reports +FMs, No LOF, no VB, no blurry vision, headaches or peripheral edema, and RUQ pain.  She plans on formula feeding. She request interval postplacental IUD until she can have BTL for birth control. She received her prenatal care at  [facility] . Has not been taking her metformin. Has a difficult living situation  Dating: By LMP --->  Estimated Date of Delivery: 01/03/23  Sono:   @[redacted]w[redacted]d , CWD, normal anatomy, cephalic presentation, anterior placenta, 3235g, 73% EFW   Prenatal History/Complications:  - Polyhydramnios - A2GDM  Past Medical History: Past Medical History:  Diagnosis Date   Anemia    Asthma    Gestational diabetes    High blood hemoglobin F (HCC) 10/09/2017   History of chlamydia and gonorrhea  08/13/2017   History of tobacco use    History of Trichomonas in Pregnancy 10/31/2017   Marijuana use    Mild intermittent asthma 08/13/2017   History of hospitalizations as an infant  Albuterol prn only      Obesity    Seasonal allergies     Past Surgical History: Past Surgical History:  Procedure Laterality Date   WISDOM TOOTH EXTRACTION      Obstetrical History: OB History     Gravida  3   Para  2   Term  2   Preterm      AB      Living  2      SAB      IAB      Ectopic      Multiple  0   Live Births  2        Obstetric Comments  06/2021: in [place], [place]. Had GDM. They recommended meds but pt didn't start. Pt unsure of exact weight but was 7+ lbs.          Social History Social History   Socioeconomic History   Marital status: Single    Spouse name: Not on file   Number of children: Not on file   Years of education: Not on file   Highest education level: Not on file  Occupational History   Not on file  Tobacco Use   Smoking status: Every Day    Current packs/day: 0.25    Average  packs/day: 0.3 packs/day for 13.0 years (3.2 ttl pk-yrs)    Types: Cigarettes    Start date: 2012   Smokeless tobacco: Former    Quit date: 07/09/2017   Tobacco comments:    07/06/2017  Vaping Use   Vaping status: Former  Substance and Sexual Activity   Alcohol use: Not Currently    Alcohol/week: 4.0 standard drinks of alcohol    Types: 1 Glasses of wine, 3 Shots of liquor per week   Drug use: Not Currently    Types: Marijuana, Cocaine    Comment: Cocaine June 2024, Marijauna April 2024   Sexual activity: Yes    Birth control/protection: None  Other Topics Concern   Not on file  Social History Narrative   Currently in school for business, entrepreneurship    Social Drivers of Health   Financial Resource Strain: Not at Risk (11/21/2022)   Received from [employer]    Financial Resource Strain: 1  Food Insecurity: No Food Insecurity (12/21/2022)   Hunger Vital Sign    Worried About  Running Out of Food in the Last Year: Never true    Ran Out of Food in the Last Year: Never true  Transportation Needs: No Transportation Needs (12/21/2022)   PRAPARE - Administrator, Civil Service (Medical): No    Lack of Transportation (Non-Medical): No  Physical Activity: Not on File (09/21/2022)   Received from [facility]   Physical Activity    Physical Activity: 0  Stress: Not on File (09/21/2022)   Received from [facility]   Stress    Stress: 0  Social Connections: Not on File (09/21/2022)   Received from [employer]   Social Connections    Connectedness: 0    Family History: Family History  Problem Relation Age of Onset   Hypertension Mother    Sickle cell anemia Mother    Diabetes Father    Hypertension Father    Diabetes Maternal Grandfather    Diabetes Paternal Grandmother     Allergies: No Known Allergies  Medications Prior to Admission  Medication Sig Dispense Refill Last Dose/Taking   Accu-Chek Softclix Lancets lancets Use as instructed QID (Patient  not taking: Reported on 12/20/2022) 100 each 12    aspirin EC 81 MG tablet Take 1 tablet (81 mg total) by mouth daily. 60 tablet 2    Blood Glucose Monitoring Suppl (ACCU-CHEK GUIDE) w/Device KIT 1 each by Does not apply route 4 (four) times daily. (Patient not taking: Reported on 12/20/2022) 1 kit 0    glucose blood (ACCU-CHEK GUIDE) test strip Use as instructed QID (Patient not taking: Reported on 12/20/2022) 100 each 12    Iron, Ferrous Sulfate, 325 (65 Fe) MG TABS Take 1 tablet by mouth every other day. 30 tablet 3    metFORMIN (GLUCOPHAGE) 500 MG tablet Take 1 tablet (500 mg total) by mouth 2 (two) times daily with a meal. (Patient not taking: Reported on 12/20/2022) 60 tablet 5    metroNIDAZOLE (FLAGYL) 500 MG tablet Take 1 tablet (500 mg total) by mouth 2 (two) times daily. 14 tablet 0    pantoprazole (PROTONIX) 20 MG tablet Take 1 tablet (20 mg total) by mouth 2 (two) times daily before a meal. (Patient not taking: Reported on 12/20/2022) 60 tablet 5    Prenatal Vit-Fe Fumarate-FA (PREPLUS) 27-1 MG TABS Take 1 tablet by mouth daily. 60 tablet 2      Review of Systems   All systems reviewed and negative except as stated in HPI  Blood pressure 104/73, pulse (!) 101, temperature 98.1 F (36.7 C), temperature source Oral, resp. rate 18, height 5\' 5"  (1.651 m), weight 112.5 kg, last menstrual period 03/29/2022, unknown if currently breastfeeding. General appearance: alert, cooperative, and no distress Lungs: normal effort Abdomen: gravid Extremities: Homans sign is negative, no sign of DVT Presentation: cephalic Fetal monitoringBaseline: 150 bpm Uterine activity: every 5 minutes Dilation: 3.5 Effacement (%): 50, 60 Station: -2 Exam by:: Vida Roller Resident, MD   Prenatal labs: ABO, Rh: --/--/PENDING (12/19 0028) Antibody: PENDING (12/19 0028) Rubella: <0.90 (07/31 1504) RPR: Non Reactive (10/14 0915)  HBsAg: Negative (07/31 1504)  HIV: Non Reactive (10/14 0915)  GBS: Negative/--  (12/10 0326)  1 hr Glucola failed, passed 2hr Genetic screening  Panorama low risk; AFP WNL Anatomy US WNL  Prenatal Transfer Tool  Maternal Diabetes: Yes:  Diabetes Type:  Insulin/Medication controlled Genetic Screening: Normal Maternal Ultrasounds/Referrals: Normal Fetal Ultrasounds or other Referrals:  None Maternal Substance Abuse:  No Significant Maternal Medications:  Meds include: Protonix Significant Maternal Lab Results:  Group B Strep negative Number of Prenatal Visits:greater than 3 verified prenatal visits Other Comments:  None  Results for orders placed or performed during the hospital encounter of 12/21/22 (from the past 24 hours)  Type and screen   Collection Time: 12/21/22 12:28 AM  Result Value Ref Range   ABO/RH(D) PENDING    Antibody Screen PENDING    Sample Expiration      12/24/2022,2359 Performed at [facility], [address] [place], [place] [address]   CBC   Collection Time: 12/21/22 12:29 AM  Result Value Ref Range   WBC 10.9 (H) 4.0 - 10.5 K/uL   RBC 4.07 3.87 - 5.11 MIL/uL   Hemoglobin 11.3 (L) 12.0 - 15.0 g/dL   HCT 47.8 (L) 29.5 - 62.1 %   MCV 82.8 80.0 - 100.0 fL   MCH 27.8 26.0 - 34.0 pg   MCHC 33.5 30.0 - 36.0 g/dL   RDW 30.8 65.7 - 84.6 %   Platelets 310 150 - 400 K/uL   nRBC 0.0 0.0 - 0.2 %  Glucose, capillary   Collection Time: 12/21/22 12:45 AM  Result Value Ref Range   Glucose-Capillary 94 70 - 99 mg/dL    Patient Active Problem List   Diagnosis Date Noted   Gestational diabetes mellitus (GDM) affecting pregnancy 12/21/2022   Polyhydramnios affecting pregnancy 11/22/2022   Gestational diabetes mellitus (GDM), antepartum 10/18/2022   Anemia affecting pregnancy in third trimester 10/18/2022   Late prenatal care affecting pregnancy, antepartum 08/30/2022   Short interval between pregnancies affecting pregnancy in second trimester, antepartum 08/02/2022   Obesity affecting pregnancy in second trimester 08/02/2022    History of substance use 07/26/2022   Supervision of high risk pregnancy, antepartum 07/25/2022   BMI 39.0-39.9,adult 08/13/2017    Assessment/Plan:  Tamara Bryant is a 32 y.o. G3P2002 at [redacted]w[redacted]d here for IOL secondary to A2GDM  #Labor: s/p OP foley bulb. AROM @ 0120. Start pitocin at 0230 and titrate up as tolerated. Anticipate NSVD #Pain: Per pt request #FWB: Cat 1 #ID: GBS negative #MOF: formula #MOC:post-placental Mirena IUD and BTL  #A2GDM: CBGs q4h in latent labor and q2h in active labor; Last 60  Gwenlyn Perking, MD  12/21/2022, 1:29 AM   GME ATTESTATION:  Evaluation and management procedures were performed by the [facility] Medicine Resident under my supervision. I was immediately available for direct supervision, assistance and direction throughout this encounter.  I also confirm that I have verified the information documented in the resident's note, and that I have also personally reperformed the pertinent components of the physical exam and all of the medical decision making activities.  I have also made any necessary editorial changes.  Wyn Forster, MD OB Fellow, Faculty Practice [facility], Center for [facility] 12/21/2022 3:41 AM

## 2022-12-21 NOTE — Progress Notes (Signed)
Labor Progress Note [name] is a 32 y.o. G3P2002 at [redacted]w[redacted]d presented for IOL 2/2 A2GDM  S: Breathing through contractions, interested in next steps for IOL.  O:  BP 109/79   Pulse 87   Temp 97.7 F (36.5 C) (Axillary)   Resp 16   Ht 5\' 5"  (1.651 m)   Wt 112.5 kg   LMP 03/29/2022   SpO2 95%   BMI 41.27 kg/m  EFM: 135/mod/+10x10 accels/no decels  CVE: Dilation: 4 Effacement (%): 70, 80 Cervical Position: Posterior Station: -3 Presentation: Vertex (via [place]) Exam by:: Kittie Plater, RN   A&P: 32 y.o. [id number] [redacted]w[redacted]d presenting for IOL 2/2 A2GDM #Labor: Plan discussed with patient and her partner -- given fetal head still ballotable on last exam, started on Pitocin, plan to recheck cx in 2 h (around 1100) and reassess for safe AROM #Pain: Per pt request, interested in epidural #FWB: Cat II, but reassuring #GBS negative  #A2GDM: CBGs q4h in latent labor and q2h in active labor  Sundra Aland, MD 9:51 AM

## 2022-12-21 NOTE — Progress Notes (Signed)
Labor Progress Note Tamara Bryant is a 32 y.o. G3P2002 at [redacted]w[redacted]d presented for IOL 2/2 A2GDM  S: Uncomfortable with ctx  O:  BP (!) 100/59   Pulse 76   Temp 98.1 F (36.7 C) (Oral)   Resp 18   Ht 5\' 5"  (1.651 m)   Wt 112.5 kg   LMP 03/29/2022   BMI 41.27 kg/m  EFM: Difficulty tracing baby on monitor; assessed via doppler 140  CVE: Dilation: 5 Effacement (%): 70, 80 Station: -2 Presentation: Transverse (via [place]) Exam by:: Leveque   A&P: 32 y.o. [id number] [redacted]w[redacted]d presenting for IOL 2/2 A2GDM #Labor: Progressing well. CVE exam with head not engaged. BSUS revealed transverse lie. Attempt FSE when able. Continue pitocin titration as tolerated. #Pain: Per pt request #FWB: Unable to accurately assess #GBS negative  #A2GDM: CBGs q4h in latent labor and q2h in active labor  Gwenlyn Perking, MD 4:51 AM

## 2022-12-21 NOTE — Plan of Care (Signed)
  Problem: Education: Goal: Knowledge of General Education information will improve Description: Including pain rating scale, medication(s)/side effects and non-pharmacologic comfort measures Outcome: Completed/Met   Problem: Health Behavior/Discharge Planning: Goal: Ability to manage health-related needs will improve Outcome: Completed/Met   Problem: Clinical Measurements: Goal: Ability to maintain clinical measurements within normal limits will improve Outcome: Completed/Met Goal: Will remain free from infection Outcome: Completed/Met Goal: Diagnostic test results will improve Outcome: Completed/Met Goal: Respiratory complications will improve Outcome: Completed/Met Goal: Cardiovascular complication will be avoided Outcome: Completed/Met   Problem: Activity: Goal: Risk for activity intolerance will decrease Outcome: Completed/Met   Problem: Nutrition: Goal: Adequate nutrition will be maintained Outcome: Completed/Met   Problem: Coping: Goal: Level of anxiety will decrease Outcome: Completed/Met   Problem: Elimination: Goal: Will not experience complications related to bowel motility Outcome: Completed/Met Goal: Will not experience complications related to urinary retention Outcome: Completed/Met   Problem: Pain Management: Goal: General experience of comfort will improve Outcome: Completed/Met   Problem: Safety: Goal: Ability to remain free from injury will improve Outcome: Completed/Met   Problem: Skin Integrity: Goal: Risk for impaired skin integrity will decrease Outcome: Completed/Met   Problem: Education: Goal: Knowledge of Childbirth will improve Outcome: Completed/Met Goal: Ability to make informed decisions regarding treatment and plan of care will improve Outcome: Completed/Met Goal: Ability to state and carry out methods to decrease the pain will improve Outcome: Completed/Met Goal: Individualized Educational Video(s) Outcome: Completed/Met    Problem: Coping: Goal: Ability to verbalize concerns and feelings about labor and delivery will improve Outcome: Completed/Met   Problem: Life Cycle: Goal: Ability to make normal progression through stages of labor will improve Outcome: Completed/Met Goal: Ability to effectively push during vaginal delivery will improve Outcome: Completed/Met   Problem: Role Relationship: Goal: Will demonstrate positive interactions with the child Outcome: Completed/Met   Problem: Safety: Goal: Risk of complications during labor and delivery will decrease Outcome: Completed/Met   Problem: Pain Management: Goal: Relief or control of pain from uterine contractions will improve Outcome: Completed/Met

## 2022-12-21 NOTE — Progress Notes (Signed)
Patient transverse on u/s exam by Dr. Leanora Cover and pitocin stopped and terbutaline given. I scanned her and baby is cephalic with large amount of AF particularly in RUQ with cord seen there; normal FHR. Cervix checked and external os 5-6 but internal os 3-4 and cervix long and baby very high up and unable to AROM.  D/w her recommend re-starting pitocin and reassess for possible AROM in approximately 2 hours and if able to AROM, consider re-scan  prior  Cornelia Copa MD Attending Center for [employer] (Faculty Practice) 12/21/2022 Time: 0600

## 2022-12-21 NOTE — Progress Notes (Signed)
Labor Progress Note Tamara Bryant is a 32 y.o. G3P2002 at [redacted]w[redacted]d presented for IOL 2/2 A2GDM  S: Pt has epidural, comfortable at bedside.  O:  BP 109/66   Pulse 69   Temp 98.1 F (36.7 C) (Oral)   Resp 16   Ht 5\' 5"  (1.651 m)   Wt 112.5 kg   LMP 03/29/2022   SpO2 99%   BMI 41.27 kg/m  EFM: 125/mod/+a/-d  CVE: Dilation: 5.5 Effacement (%): 70 Cervical Position: Posterior Station: -3 Presentation: Vertex Exam by:: Dr. Lucianne Muss   A&P: 32 y.o. [id number] [redacted]w[redacted]d presenting for IOL 2/2 A2GDM #Labor: AROM discussed again and pt verbally consented  AROM performed with FSE to allow for small puncture point, copious amount of clear fluid, following AROM, descent of fetal head onto cervix noted  #Pain: Epidural #FWB: Cat II, but reassuring #GBS negative  #A2GDM: CBGs q4h in latent labor and q2h in active labor  Sundra Aland, MD 4:46 PM

## 2022-12-21 NOTE — Discharge Summary (Signed)
Postpartum Discharge Summary    Patient Name: Tamara Bryant DOB: [birth year] MRN: [id number]  Date of admission: 12/21/2022 Delivery date:12/21/2022 Delivering provider: CHUBB, CASEY C Date of discharge: 12/23/2022  Admitting diagnosis: Gestational diabetes mellitus (GDM) affecting pregnancy [O24.419] Intrauterine pregnancy: [redacted]w[redacted]d     Secondary diagnosis:  Principal Problem:   SVD (spontaneous vaginal delivery) Active Problems:   Gestational diabetes mellitus (GDM) affecting pregnancy   Encounter for insertion of intrauterine contraceptive device (IUD)   Postpartum care following vaginal delivery  Additional problems: SWC placed per patient request    Discharge diagnosis: Term Pregnancy Delivered                                              Post partum procedures: Post placental IUD insertion Augmentation: AROM, Pitocin, and IP Foley Complications: None  Hospital course: Induction of Labor With Vaginal Delivery   32 y.o. yo [id number] at [redacted]w[redacted]d was admitted to the hospital 12/21/2022 for induction of labor.  Indication for induction: A2 DM.  Patient had an labor course that was uncomplicated.  She had successful placement of post placental Mirena IUD using bedside US guidance.  Membrane Rupture Time/Date: 3:37 PM,12/21/2022  Delivery Method:Vaginal, Spontaneous Operative Delivery:N/A Episiotomy: None Lacerations:    Details of delivery can be found in separate delivery note.  Patient had a postpartum course complicated by none. Patient is discharged home 12/23/22.  Newborn Data: Birth date:12/21/2022 Birth time:9:09 PM Gender:Female Living status:Living Apgars:9 ,9  Weight:3250 g  Magnesium Sulfate received: No BMZ received: No Rhophylac:N/A MMR:Yes ordered T-DaP:Given prenatally Flu: No RSV Vaccine received: No Transfusion:No  Immunizations received: Immunization History  Administered Date(s) Administered   DTaP 01/25/1993, 03/29/1993, 05/04/1993, 05/15/1994    Fluzone Influenza virus vaccine,trivalent (IIV3), split virus 11/06/2013, 09/25/2016, 11/02/2016, 09/11/2017   HPV 9-valent 02/06/2020   HPV Quadrivalent 02/06/2020   Hepatitis B, PED/ADOLESCENT 30-Aug-1992, 12/23/1992, 08/01/1993   Influenza Split 11/06/2013, 11/02/2016   Influenza, Seasonal, Injecte, Preservative Fre 09/27/2022   Influenza,inj,Quad PF,6+ Mos 09/25/2016, 09/11/2017   MMR 02/13/1994, 09/02/1997, 03/02/2018, 12/23/2022   PFIZER(Purple Top)SARS-COV-2 Vaccination 10/09/2019   Rsv, Bivalent, Protein Subunit Rsvpref,pf Verdis Frederickson) 11/27/2022   Tdap 12/02/2004, 07/16/2017, 10/16/2022   Varicella 09/02/1997    Physical exam  Vitals:   12/22/22 0850 12/22/22 1235 12/22/22 2100 12/23/22 0530  BP: 112/74 109/78 106/65 115/70  Pulse: 81 86 78 72  Resp: 18 16 18 16   Temp: 98.4 F (36.9 C)  98.1 F (36.7 C) 97.7 F (36.5 C)  TempSrc: Oral  Oral Oral  SpO2: 97% 98% 100%   Weight:      Height:       General: alert, cooperative, and no distress Lochia: appropriate Uterine Fundus: firm Incision: N/A DVT Evaluation: No evidence of DVT seen on physical exam. Labs: Lab Results  Component Value Date   WBC 12.5 (H) 12/22/2022   HGB 9.8 (L) 12/22/2022   HCT 29.4 (L) 12/22/2022   MCV 82.4 12/22/2022   PLT 232 12/22/2022      Latest Ref Rng & Units 10/07/2022   10:54 AM  CMP  Glucose 70 - 99 mg/dL 91   BUN 6 - 20 mg/dL 8   Creatinine 1.30 - 8.65 mg/dL 7.84   Sodium 696 - 295 mmol/L 135   Potassium 3.5 - 5.1 mmol/L 3.6   Chloride 98 - 111 mmol/L 107  CO2 22 - 32 mmol/L 20   Calcium 8.9 - 10.3 mg/dL 8.9   Total Protein 6.5 - 8.1 g/dL 5.5   Total Bilirubin 0.3 - 1.2 mg/dL 0.4   Alkaline Phos 38 - 126 U/L 49   AST 15 - 41 U/L 11   ALT 0 - 44 U/L 10    Edinburgh Score:    12/22/2022    5:46 PM  Edinburgh Postnatal Depression Scale Screening Tool  I have been able to laugh and see the funny side of things. 0  I have looked forward with enjoyment to things. 0  I  have blamed myself unnecessarily when things went wrong. 0  I have been anxious or worried for no good reason. 0  I have felt scared or panicky for no good reason. 0  Things have been getting on top of me. 0  I have been so unhappy that I have had difficulty sleeping. 0  I have felt sad or miserable. 1  I have been so unhappy that I have been crying. 0  The thought of harming myself has occurred to me. 0  Edinburgh Postnatal Depression Scale Total 1   Edinburgh Postnatal Depression Scale Total: 1   After visit meds:  Allergies as of 12/23/2022   No Known Allergies      Medication List     STOP taking these medications    Accu-Chek Guide test strip Generic drug: glucose blood   Accu-Chek Guide w/Device Kit   Accu-Chek Softclix Lancets lancets   aspirin EC 81 MG tablet   metFORMIN 500 MG tablet Commonly known as: GLUCOPHAGE   metroNIDAZOLE 500 MG tablet Commonly known as: FLAGYL   pantoprazole 20 MG tablet Commonly known as: Protonix       TAKE these medications    acetaminophen 325 MG tablet Commonly known as: Tylenol Take 2 tablets (650 mg total) by mouth every 4 (four) hours as needed (for pain scale < 4).   benzocaine-Menthol 20-0.5 % Aero Commonly known as: DERMOPLAST Apply 1 Application topically as needed for irritation (perineal discomfort).   coconut oil Oil Apply 1 Application topically as needed.   dibucaine 1 % Oint Commonly known as: NUPERCAINAL Place 1 Application rectally as needed for hemorrhoids.   ferrous sulfate 325 (65 FE) MG tablet Take 1 tablet (325 mg total) by mouth every other day. Start taking on: December 24, 2022 What changed:  medication strength how much to take   ibuprofen 600 MG tablet Commonly known as: ADVIL Take 1 tablet (600 mg total) by mouth every 6 (six) hours.   nicotine 14 mg/24hr patch Commonly known as: NICODERM CQ - dosed in mg/24 hours Place 1 patch (14 mg total) onto the skin daily. Start taking  on: December 24, 2022   PrePLUS 27-1 MG Tabs Take 1 tablet by mouth daily.   senna-docusate 8.6-50 MG tablet Commonly known as: Senokot-S Take 2 tablets by mouth daily.   witch hazel-glycerin pad Commonly known as: TUCKS Apply 1 Application topically as needed for hemorrhoids.         Discharge home in stable condition Infant Feeding: Bottle Infant Disposition:home with mother Discharge instruction: per After Visit Summary and Postpartum booklet. Activity: Advance as tolerated. Pelvic rest for 6 weeks.  Diet: routine diet Future Appointments: Future Appointments  Date Time Provider Department Center  02/02/2023 10:35 AM Adam Phenix, MD [facility]   Follow up Visit: Message sent to [facility] 12/21/22  Please schedule this patient for a  In person postpartum visit in 4 weeks with the following provider: Any provider. Additionally sent a message to [facility] to schedule appointment with [facility]. Additional Postpartum F/U:2 hour GTT  High risk pregnancy complicated by: GDM and Hx of substance use, short interval pregnancy, obesity, late prenatal care, anemia, polyhydramnios.  Delivery mode:  Vaginal, Spontaneous Anticipated Birth Control:  PP IUD placed   Lamont Snowball, MSN, CNM, RNC-OB Certified Nurse Midwife, [facility] 12/23/2022 12:16 PM

## 2022-12-21 NOTE — Anesthesia Preprocedure Evaluation (Signed)
Anesthesia Evaluation  Patient identified by MRN, date of birth, ID band Patient awake    Reviewed: Allergy & Precautions, Patient's Chart, lab work & pertinent test results  Airway Mallampati: II  TM Distance: >3 FB Neck ROM: Full    Dental no notable dental hx.    Pulmonary asthma , Current Smoker   Pulmonary exam normal breath sounds clear to auscultation       Cardiovascular negative cardio ROS Normal cardiovascular exam Rhythm:Regular Rate:Normal     Neuro/Psych negative neurological ROS  negative psych ROS   GI/Hepatic negative GI ROS,,,(+)       marijuana use  Endo/Other  diabetes, Well Controlled, Gestational    Renal/GU negative Renal ROS  negative genitourinary   Musculoskeletal negative musculoskeletal ROS (+)    Abdominal   Peds negative pediatric ROS (+)  Hematology  (+) Blood dyscrasia, anemia Hb 11.3, plt 310   Anesthesia Other Findings   Reproductive/Obstetrics (+) Pregnancy                              Anesthesia Physical Anesthesia Plan  ASA: 3  Anesthesia Plan: Epidural   Post-op Pain Management:    Induction:   PONV Risk Score and Plan: 2  Airway Management Planned: Natural Airway  Additional Equipment: None  Intra-op Plan:   Post-operative Plan:   Informed Consent: I have reviewed the patients History and Physical, chart, labs and discussed the procedure including the risks, benefits and alternatives for the proposed anesthesia with the patient or authorized representative who has indicated his/her understanding and acceptance.       Plan Discussed with:   Anesthesia Plan Comments:          Anesthesia Quick Evaluation

## 2022-12-21 NOTE — Anesthesia Procedure Notes (Signed)
Epidural Patient location during procedure: OB Start time: 12/21/2022 2:08 PM End time: 12/21/2022 2:17 PM  Staffing Anesthesiologist: Lannie Fields, DO Performed: anesthesiologist   Preanesthetic Checklist Completed: patient identified, IV checked, risks and benefits discussed, monitors and equipment checked, pre-op evaluation and timeout performed  Epidural Patient position: sitting Prep: DuraPrep and site prepped and draped Patient monitoring: continuous pulse ox, blood pressure, heart rate and cardiac monitor Approach: midline Location: L3-L4 Injection technique: LOR air  Needle:  Needle type: Tuohy  Needle gauge: 17 G Needle length: 9 cm Needle insertion depth: 9 cm Catheter type: closed end flexible Catheter size: 19 Gauge Catheter at skin depth: 15 cm Test dose: negative  Assessment Sensory level: T8 Events: blood not aspirated, no cerebrospinal fluid, injection not painful, no injection resistance, no paresthesia and negative IV test  Additional Notes Patient identified. Risks/Benefits/Options discussed with patient including but not limited to bleeding, infection, nerve damage, paralysis, failed block, incomplete pain control, headache, blood pressure changes, nausea, vomiting, reactions to medication both or allergic, itching and postpartum back pain. Confirmed with bedside nurse the patient's most recent platelet count. Confirmed with patient that they are not currently taking any anticoagulation, have any bleeding history or any family history of bleeding disorders. Patient expressed understanding and wished to proceed. All questions were answered. Sterile technique was used throughout the entire procedure. Please see nursing notes for vital signs. Test dose was given through epidural catheter and negative prior to continuing to dose epidural or start infusion. Warning signs of high block given to the patient including shortness of breath, tingling/numbness in  hands, complete motor block, or any concerning symptoms with instructions to call for help. Patient was given instructions on fall risk and not to get out of bed. All questions and concerns addressed with instructions to call with any issues or inadequate analgesia.      LOR at 9+++, may need longer needle in futureReason for block:procedure for pain

## 2022-12-21 NOTE — Progress Notes (Signed)
Labor Progress Note MARITZA MONGAN is a 32 y.o. G3P2002 at [redacted]w[redacted]d presented for IOL 2/2 A2GDM  S: Pt using nitrous, which is helping some w ctx.  O:  BP 124/62   Pulse 76   Temp 97.8 F (36.6 C) (Oral)   Resp 18   Ht 5\' 5"  (1.651 m)   Wt 112.5 kg   LMP 03/29/2022   SpO2 99%   BMI 41.27 kg/m  EFM: 135/mod/+10x10 accels/no decels  CVE: Dilation: 4 Effacement (%): 70 Cervical Position: Posterior Station: Ballotable Presentation: Vertex (via [place]) Exam by:: Dr. Lucianne Muss   A&P: 32 y.o. N5A2130 [redacted]w[redacted]d presenting for IOL 2/2 A2GDM #Labor: On cx exam, fetal head felt, but still very ballotable, so AROM not performed, discussed plan to recheck in 2 hr  cont to uptitrate pit #Pain: Nitrous now, interested in epidural #FWB: Cat II, but reassuring #GBS negative  #A2GDM: CBGs q4h in latent labor and q2h in active labor  Sundra Aland, MD 11:22 AM

## 2022-12-22 ENCOUNTER — Inpatient Hospital Stay (HOSPITAL_COMMUNITY): Payer: Medicaid Other

## 2022-12-22 LAB — GLUCOSE, CAPILLARY: Glucose-Capillary: 133 mg/dL — ABNORMAL HIGH (ref 70–99)

## 2022-12-22 LAB — CBC
HCT: 29.4 % — ABNORMAL LOW (ref 36.0–46.0)
Hemoglobin: 9.8 g/dL — ABNORMAL LOW (ref 12.0–15.0)
MCH: 27.5 pg (ref 26.0–34.0)
MCHC: 33.3 g/dL (ref 30.0–36.0)
MCV: 82.4 fL (ref 80.0–100.0)
Platelets: 232 10*3/uL (ref 150–400)
RBC: 3.57 MIL/uL — ABNORMAL LOW (ref 3.87–5.11)
RDW: 15.1 % (ref 11.5–15.5)
WBC: 12.5 10*3/uL — ABNORMAL HIGH (ref 4.0–10.5)
nRBC: 0 % (ref 0.0–0.2)

## 2022-12-22 MED ORDER — FERROUS SULFATE 325 (65 FE) MG PO TABS
325.0000 mg | ORAL_TABLET | ORAL | Status: DC
  Administered 2022-12-22: 325 mg via ORAL
  Filled 2022-12-22: qty 1

## 2022-12-22 NOTE — Progress Notes (Signed)
Pt's CBG at 0641 was not a fasting glucose. Pt stated that she had coffee with sugar 15-30 minutes prior to check.

## 2022-12-22 NOTE — Progress Notes (Addendum)
POSTPARTUM PROGRESS NOTE  Post Partum Day 1  Subjective:  Tamara Bryant is a 32 y.o. U9W1191 s/p SVD at [redacted]w[redacted]d.  She reports she is doing well. No acute events overnight. She denies any problems with ambulating, voiding or po intake. Denies nausea or vomiting.  Pain is well controlled.  Lochia is appropriate.  Objective: Blood pressure 102/72, pulse 83, temperature 98.2 F (36.8 C), temperature source Oral, resp. rate 17, height 5\' 5"  (1.651 m), weight 112.5 kg, last menstrual period 03/29/2022, SpO2 100%, unknown if currently breastfeeding.  Physical Exam:  General: alert, cooperative and no distress Chest: no respiratory distress Heart:regular rate, distal pulses intact Uterine Fundus: firm, appropriately tender DVT Evaluation: No calf swelling or tenderness Extremities: trace edema Skin: warm, dry  Recent Labs    12/21/22 0029 12/22/22 0433  HGB 11.3* 9.8*  HCT 33.7* 29.4*    Assessment/Plan: Tamara Bryant is a 32 y.o. Y7W2956 s/p SVD at [redacted]w[redacted]d   PPD#1 - Doing well  Routine postpartum care Acute blood loss anemia, clinically significant - Hgb 11.3>9.8  Start PO iron supplementation  Hx of substance use, late to prenatal care, short interval pregnancy   SW consult, appreciate their recs  Contraception: PP IUD placed as bridge to interval BTL Feeding: bottle Dispo: Plan for discharge 12/21.   LOS: 1 day   [name], MD OB Fellow  12/22/2022, 7:04 AM

## 2022-12-22 NOTE — Anesthesia Postprocedure Evaluation (Signed)
Anesthesia Post Note  Patient: Tamara Bryant  Procedure(s) Performed: AN AD HOC LABOR EPIDURAL     Patient location during evaluation: Mother Baby Anesthesia Type: Epidural Level of consciousness: awake and alert and oriented Pain management: satisfactory to patient Vital Signs Assessment: post-procedure vital signs reviewed and stable Respiratory status: respiratory function stable Cardiovascular status: stable Postop Assessment: no headache, no backache, epidural receding, patient able to bend at knees, no signs of nausea or vomiting, adequate PO intake and able to ambulate Anesthetic complications: no   No notable events documented.  Last Vitals:  Vitals:   12/22/22 0057 12/22/22 0450  BP: 107/71 102/72  Pulse: 83 83  Resp: 18 17  Temp: 36.6 C 36.8 C  SpO2: 99% 100%    Last Pain:  Vitals:   12/22/22 0708  TempSrc:   PainSc: Asleep   Pain Goal: Patients Stated Pain Goal: 8 (12/21/22 1350)                 Mazi Schuff

## 2022-12-22 NOTE — Clinical Social Work Maternal (Signed)
CLINICAL SOCIAL WORK MATERNAL/CHILD NOTE  Patient Details  Name: Tamara Bryant MRN: [id number] Date of Birth: May 04, 1990  Date:  12/22/2022  Clinical Social Worker Initiating Note:  Tamara Bryant Date/Time: Initiated:  12/22/22/1455     Child's Name:  Tamara Bryant   Biological Parents:  Mother, Father Tamara Bryant [birth year] 06[birth year] 1992 Tamara Bryant 12-11-1994)   Need for Interpreter:  None   Reason for Referral:  Behavioral Health Concerns, Current Substance Use/Substance Use During Pregnancy     Address:  [address] [place] [address]    Phone number:  [phone] (home)     Additional phone number:   Household Members/Support Persons (HM/SP):   Household Member/Support Person 1, Household Member/Support Person 2, Household Member/Support Person 3   HM/SP Name Relationship DOB or Age  HM/SP -1 Tamara Bryant Daughter 03-01-2018  HM/SP -2 Tamara Bryant 06-16-2021  HM/SP -3 Tamara Bryant FOB 12-11-1994  HM/SP -4        HM/SP -5        HM/SP -6        HM/SP -7        HM/SP -8          Natural Supports (not living in the home):  Immediate Family, Spouse/significant other   Professional Supports: None   Employment: Unemployed   Type of Work:     Education:  Some Materials engineer arranged:    Surveyor, quantity Resources:  [employer]   Other Resources:  [employer], Sales executive     Cultural/Religious Considerations Which May Impact Care:    Strengths:  Home prepared for child  , Merchandiser, retail, Ability to meet basic needs  , Understanding of illness   Psychotropic Medications:         Pediatrician:    Armed forces operational officer area  Pediatrician List:   [facility] ([address] [employer])  [place]    [place]      Pediatrician Fax Number:    Risk Factors/Current Problems:  Substance Use  , Mental Health Concerns     Cognitive State:  Able to Concentrate  , Alert  ,  Linear Thinking  , Insightful  , Goal Oriented     Mood/Affect:  Calm  , Comfortable  , Interested  , Relaxed     CSW Assessment: CSW received a consult for  "MOB living in hotel, hx thc, cocaine".  CSW met MOB at bedside to complete a full psychosocial assessment and offer support. CSW entered the room, introduced herself and acknowledged that FOB was present. MOB gave CSW verbal permission to speak about anything while FOB was present. CSW explained her role and the reason for the visit. MOB was polite, easy to engage, receptive to meeting with CSW, and appeared forthcoming.  CSW collected MOB's demographic information and she reported no longer living in the hotel; they are living at the address on file [address] [place], [place] [address]. MOB denied any CPS involvement.   CSW inquired about MOB's mental health history. MOB reported being diagnosed with depression, BPD, anxiety, Schizophrenia and Bipolar. MOB reported these diagnosis came at a early age in childhood until 2021. MOB denied ever being hospitalized. MOB reported being prescribed medication In the past; however she did not like it made her feel  and most times would forget to take them. MOB reported participating in therapy  in the past and currently with [facility] twice a week with [employer]. MOB reported her Bipolar as maniac and depressive; however she could not remember the last time she had a episode. CSW provided education regarding the baby blues period vs. perinatal mood disorders, discussed treatment and gave resources for mental health follow up if concerns arise.  CSW recommends self-evaluation during the postpartum time period using the New Mom Checklist from Postpartum Progress and encouraged MOB to contact a medical professional if symptoms are noted at any time.  CSW assessed for safety with MOB SI and HI; MOB denied all. CSW did not assess for DV; FOB was present.  CSW asked MOB does she receive  support resources; MOB said yes(WIC and food stamps). MOB reported having all essential items for the infant including a carseat, bassinet and crib for safe sleeping. MOB reported having all essential items for the infant including a carseat, bassinet and crib for safe sleeping. CSW provided review of Sudden Infant Death Syndrome (SIDS) precautions.  CSW informed MOB due to the cocaine use during her pregnancy; the hospital will perform a UDS and CDS on the infant. If the screenings return with positive results a report to CPS will be made; MOB was understanding. MOB reported her last use of cocaine was in August 2024. MOB reported her use of cocaine began due to the passing of her brother in 2021, she discontinued her use when she became pregnant and lastly began again in 2024 when her grandmother passed in May. CSW asked MOB if supportive services regarding addiction would be helpful and she declined at this time.  CSW Plan/Description:  No Further Intervention Required/No Barriers to Discharge, Sudden Infant Death Syndrome (SIDS) Education, Perinatal Mood and Anxiety Disorder (PMADs) Education, Hospital Drug Screen Policy Information, Other Information/Referral to [facility], CSW Will Continue to Monitor Umbilical Cord Tissue Drug Screen Results and Make Report if Tamara Weymer, LCSW 12/22/2022, 2:59 PM

## 2022-12-23 ENCOUNTER — Other Ambulatory Visit (HOSPITAL_COMMUNITY): Payer: Self-pay

## 2022-12-23 MED ORDER — FERROUS SULFATE 325 (65 FE) MG PO TABS
325.0000 mg | ORAL_TABLET | ORAL | 0 refills | Status: DC
  Filled 2022-12-23: qty 20, 40d supply, fill #0

## 2022-12-23 MED ORDER — ACETAMINOPHEN 325 MG PO TABS: 650.0000 mg | ORAL_TABLET | ORAL | Status: DC | PRN

## 2022-12-23 MED ORDER — BENZOCAINE-MENTHOL 20-0.5 % EX AERO
1.0000 | INHALATION_SPRAY | CUTANEOUS | 0 refills | Status: DC | PRN
  Filled 2022-12-23: qty 85, 30d supply, fill #0

## 2022-12-23 MED ORDER — IBUPROFEN 600 MG PO TABS
600.0000 mg | ORAL_TABLET | Freq: Four times a day (QID) | ORAL | 0 refills | Status: DC
  Filled 2022-12-23: qty 30, 8d supply, fill #0

## 2022-12-23 MED ORDER — DIBUCAINE (PERIANAL) 1 % EX OINT: 1.0000 | TOPICAL_OINTMENT | CUTANEOUS | Status: DC | PRN

## 2022-12-23 MED ORDER — SENNOSIDES-DOCUSATE SODIUM 8.6-50 MG PO TABS
2.0000 | ORAL_TABLET | ORAL | 0 refills | Status: DC
  Filled 2022-12-23: qty 60, 30d supply, fill #0

## 2022-12-23 MED ORDER — COCONUT OIL OIL: 1.0000 | TOPICAL_OIL | Status: DC | PRN

## 2022-12-23 MED ORDER — NICOTINE 14 MG/24HR TD PT24
14.0000 mg | MEDICATED_PATCH | Freq: Every day | TRANSDERMAL | 0 refills | Status: DC
  Filled 2022-12-23: qty 28, 28d supply, fill #0

## 2022-12-23 MED ORDER — WITCH HAZEL-GLYCERIN EX PADS
1.0000 | MEDICATED_PAD | CUTANEOUS | 12 refills | Status: DC | PRN
  Filled 2022-12-23: qty 40, 40d supply, fill #0

## 2022-12-23 NOTE — Progress Notes (Signed)
Asked to go to patient's room by bedside RN due to patient requesting a therapist or someone to talk to.  Patient feeling overwhelmed & not fully supported by support person in room.  Pt states she has a history of PP Dep & has missed most recent therapy appointments due to being in hospital.  This RN let patient know that we can place a social work consult for resources & support, patient agreeable to this.  Chaplain services offered for the meantime, patient stated "That would be nice".  Chaplain notified & is in route to department.  Pt updated.  Nursery offered x 2 to allow patient to rest, she declines at this time.

## 2022-12-23 NOTE — Progress Notes (Signed)
Chaplain visits at pt request and shares concerns about her support person, the baby's father. Chaplain allows her to share her concerns and vent frustrations, and chaplain normalizes her emotions and experience and provides active listening. Pt asks that chaplain visit again during day shift, which chaplain agrees to request.

## 2022-12-23 NOTE — Progress Notes (Addendum)
CSW was consulted due to [facility] requesting emotional support and resources due to feelings of overwhelm and not feeling supported by FOB. It was noted that MOB has a history of postpartum depression.   CSW met with MOB at bedside to assess for needs and provide support. When CSW entered room, MOB was observed laying in bed with infant asleep on back in bassinet. FOB was present standing nearby. CSW introduced self and requested to speak with MOB alone. FOB left room. CSW explained reason for consult. MOB confirmed that she had requested to speak with someone and shared about recent relationship stressors and feeling unsupported during her pregnancy and since birth. MOB shared about feelings of stress once discharged due to healing from birth while caring for infant and her two other children with limited support. CSW provided active listening, emotional support, and validated MOB's feelings.   MOB confirmed that she has a therapist at [facility] who she meets with twice per week. MOB shares that she will meet with her therapist again this coming week.  MOB identified her mother and sister as supports. MOB shares her mother lives in [place], [place] but will be here visiting until Sunday and shares it helps to talk with her mother on the phone. CSW encouraged MOB to voice her relationship concerns to her therapist at her upcoming appointment and discussed couple's therapy as a potential option to work through recent relationship conflict. MOB denied current DV. Additional crisis and outpatient mental health resources were provided. MOB identified taking a shower prior to discharge as an action she can take to care for herself. CSW and MOB discussed ways MOB can express her needs to be able to care for herself and her children once discharged. CSW commended MOB for asking to speak with someone about her recent stressors and encouraged her to prioritize self care as much as she is realistically able  to.   Signed,  Norberto Sorenson, MSW, LCSWA, LCASA 12/23/2022 1:30 PM

## 2022-12-28 ENCOUNTER — Telehealth (HOSPITAL_COMMUNITY): Payer: Self-pay | Admitting: *Deleted

## 2022-12-28 NOTE — Telephone Encounter (Signed)
12/28/2022  Name: EILYN MATO MRN: [id number] DOB: [birth year]  Reason for Call:  Transition of Care Hospital Discharge Call  Contact Status: Patient Contact Status: Complete  Language assistant needed: Interpreter Mode: Interpreter Not Needed        Follow-Up Questions: Do You Have Any Concerns About Your Health As You Heal From Delivery?: No Do You Have Any Concerns About Your Infants Health?: No  Edinburgh Postnatal Depression Scale:  In the Past 7 Days: I have been able to laugh and see the funny side of things.: Not quite so much now I have looked forward with enjoyment to things.: Rather less than I used to I have blamed myself unnecessarily when things went wrong.: No, never I have been anxious or worried for no good reason.: Hardly ever I have felt scared or panicky for no good reason.: No, not at all Things have been getting on top of me.: No, most of the time I have coped quite well I have been so unhappy that I have had difficulty sleeping.: Yes, most of the time I have felt sad or miserable.: Yes, quite often I have been so unhappy that I have been crying.: Only occasionally The thought of harming myself has occurred to me.: Never Edinburgh Postnatal Depression Scale Total: (!) 10  PHQ2-9 Depression Scale:     Discharge Follow-up: Edinburgh score requires follow up?: Yes Provider notified of Edinburgh score?: Yes Have you already been referred for a counseling appointment?: Yes Date of appointment:: 12/29/22 (Usually sees therapist on Wed & Fri.  Forgot appt on 12/18, was in hospital on 12/20, 12/25 was a holiday, plans to attend tomorrow 12/27.  Does not foresee any barriers to attending appointment.) Any barriers for going to appointment?: no Patient was advised of the following resources:: Support Group, Breastfeeding Support Group  Post-discharge interventions: Reviewed Newborn Safe Sleep Practices Maternal Mental Health Resources provided  Salena Saner, RN 12/28/2022 16:15

## 2022-12-29 NOTE — BH Specialist Note (Signed)
 Integrated Behavioral Health via Telemedicine Visit  01/11/2023 Tamara Bryant [id number]  Number of Integrated Behavioral Health Clinician visits: 1- Initial Visit  Session Start time: 0915   Session End time: 0926  Total time in minutes: 11   Referring Provider: Camie Rote, MSN, CNM, RNC-OB Patient/Family location: [facility] Provider location: [facility]  All persons participating in visit: Patient Tamara Bryant and [facility] Javona Bergevin   Types of Service: Individual psychotherapy and Video visit  I connected with Romona E Frid and/or Porscha E Dirocco's  n/a  via  Telephone or Video Enabled Telemedicine Application  (Video is Caregility application) and verified that I am speaking with the correct person using two identifiers. Discussed confidentiality: Yes   I discussed the limitations of telemedicine and the availability of in person appointments.  Discussed there is a possibility of technology failure and discussed alternative modes of communication if that failure occurs.  I discussed that engaging in this telemedicine visit, they consent to the provision of behavioral healthcare and the services will be billed under their insurance.  Patient and/or legal guardian expressed understanding and consented to Telemedicine visit: Yes   Presenting Concerns: Patient and/or family reports the following symptoms/concerns: Pt has some medical concerns regarding tear in labor; is eating and sleeping well postpartum; is seeing therapist twice weekly. No other concern at this time  Patient and/or Family's Strengths/Protective Factors: Social connections, Concrete supports in place (healthy food, safe environments, etc.), and Sense of purpose  Goals Addressed: Patient will:   Increase knowledge and/or ability of: healthy habits   Demonstrate ability to: Increase healthy adjustment to current life circumstances  Progress  towards Goals: Ongoing  Interventions: Interventions utilized:  Psychoeducation and/or Health Education Standardized Assessments completed: Not Needed  Patient and/or Family Response: Patient agrees with treatment plan.   Assessment: Patient currently experiencing Bipolar affective disorder (as previously diagnosed).   Patient may benefit from continued ongoing therapy via [facility]; referral back to ob/gyn medical providers for medical concern.  Plan: Follow up with behavioral health clinician on : Call Rhiannan Kievit at [phone], as needed. Behavioral recommendations:  -Continue prioritizing healthy self-care (regular meals, adequate rest; allowing practical help from supportive friends and family) until at least postpartum medical appointment -Consider new mom support group as needed at either [email or web address].net or [email or web address]  -Expect call-back from ob/gyn office regarding medical concern Referral(s): Integrated Art Gallery Manager (In Clinic) and [facility]:  new mom support  I discussed the assessment and treatment plan with the patient and/or parent/guardian. They were provided an opportunity to ask questions and all were answered. They agreed with the plan and demonstrated an understanding of the instructions.   They were advised to call back or seek an in-person evaluation if the symptoms worsen or if the condition fails to improve as anticipated.  Roxi Hlavaty C Maison Kestenbaum, LCSW

## 2023-01-11 ENCOUNTER — Ambulatory Visit: Payer: Medicaid Other | Admitting: Clinical

## 2023-01-11 ENCOUNTER — Telehealth: Payer: Self-pay

## 2023-01-11 DIAGNOSIS — F319 Bipolar disorder, unspecified: Secondary | ICD-10-CM

## 2023-01-11 NOTE — Telephone Encounter (Addendum)
-----   Message from [facility] sent at 01/11/2023  9:33 AM EST ----- Pt requesting call-back, concerned about the following:   # tore some; labia feels like burn/discomfort; almost like an itch, but afraid to scratch #theres an odor, thinking about taking leftover Flagyl  # hospital dr said a little bit of a tear; pt says she thinks it's in the vaginal hole area, want to make sure the stiches haven't come out  Requesting advice  Called pt and informed pt that she should continue to use the spray and also take her ibuprofen  and that we can evaluate her at her appt scheduled on 01/18/23 unless her pain is intolerable.  Pt agreed and did not have other questions.   Naomi Fitton,RN  01/11/23

## 2023-01-18 ENCOUNTER — Other Ambulatory Visit (HOSPITAL_COMMUNITY)
Admission: RE | Admit: 2023-01-18 | Discharge: 2023-01-18 | Disposition: A | Discharge status: Home / Self Care | Payer: Medicaid Other | Source: Ambulatory Visit | Attending: Obstetrics and Gynecology | Admitting: Obstetrics and Gynecology

## 2023-01-18 ENCOUNTER — Ambulatory Visit: Payer: Medicaid Other | Admitting: Obstetrics and Gynecology

## 2023-01-18 ENCOUNTER — Other Ambulatory Visit: Payer: Self-pay

## 2023-01-18 DIAGNOSIS — N898 Other specified noninflammatory disorders of vagina: Secondary | ICD-10-CM | POA: Diagnosis present

## 2023-01-18 DIAGNOSIS — T8332XA Displacement of intrauterine contraceptive device, initial encounter: Secondary | ICD-10-CM | POA: Diagnosis not present

## 2023-01-18 NOTE — Progress Notes (Signed)
Post Partum Visit Note  Tamara Bryant is a 33 y.o. [id number] female who presents for a postpartum visit. She is 4 weeks postpartum following a normal spontaneous vaginal delivery.  I have fully reviewed the prenatal and intrapartum course. The delivery was at 38.1 gestational weeks.  Anesthesia: epidural. Postpartum course has been uncomplicated. Baby is doing well. Baby is feeding by bottle - Similac Sensitive RS. Bleeding staining only. Bowel function is normal. Bladder function is normal. Patient is not sexually active. Contraception method is IUD. Postpartum depression screening: negative.   The pregnancy intention screening data noted above was reviewed. Potential methods of contraception were discussed. The patient elected to proceed with No data recorded.   Edinburgh Postnatal Depression Scale - 01/18/23 1540       Edinburgh Postnatal Depression Scale:  In the Past 7 Days   I have been able to laugh and see the funny side of things. 0    I have looked forward with enjoyment to things. 0    I have blamed myself unnecessarily when things went wrong. 0    I have been anxious or worried for no good reason. 0    I have felt scared or panicky for no good reason. 0    Things have been getting on top of me. 3    I have been so unhappy that I have had difficulty sleeping. 0    I have felt sad or miserable. 2    I have been so unhappy that I have been crying. 2    The thought of harming myself has occurred to me. 0    Edinburgh Postnatal Depression Scale Total 7             Health Maintenance Due  Topic Date Due   Pneumococcal Vaccine 60-63 Years old (1 of 2 - PCV) Never done   HPV VACCINES (2 - 3-dose SCDM series) 03/05/2020   COVID-19 Vaccine (2 - 2024-25 season) 09/03/2022    The following portions of the patient's history were reviewed and updated as appropriate: allergies, current medications, past family history, past medical history, past social history, past surgical  history, and problem list.  Review of Systems Pertinent items are noted in HPI.  Objective:  BP 121/78   Pulse 94   Ht 5\' 5"  (1.651 m)   Wt 228 lb (103.4 kg)   LMP 03/29/2022   Breastfeeding No   BMI 37.94 kg/m    General:  alert, cooperative, no distress, and morbidly obese   Breasts:  not indicated  Lungs: clear to auscultation bilaterally  Heart:  regular rate and rhythm  Abdomen: soft, non-tender; bowel sounds normal; no masses,  no organomegaly   Wound N/a  GU exam:  normal      Right labial laceration has healed Assessment:    Encounter for postpartum care  normal postpartum exam.   Plan:   Essential components of care per ACOG recommendations:  1.  Mood and well being: Patient with negative depression screening today. Reviewed local resources for support.  - Patient tobacco use? Yes. Patient desires to quit? No.   - hx of drug use? No.    2. Infant care and feeding:  -Patient currently breastmilk feeding? No.  -Social determinants of health (SDOH) reviewed in EPIC. No concerns.  3. Sexuality, contraception and birth spacing - Patient does not want a pregnancy in the next year.  Desired family size is 3 children.  - Reviewed reproductive life planning.  Reviewed contraceptive methods based on pt preferences and effectiveness.  Patient desired IUD or IUS today.   - Discussed birth spacing of 18 months  4. Sleep and fatigue -Encouraged family/partner/community support of 4 hrs of uninterrupted sleep to help with mood and fatigue  5. Physical Recovery  - Discussed patients delivery and complications. She describes her labor as good. - Patient had a Vaginal, no problems at delivery. Patient had a  right labial  laceration. Perineal healing reviewed. Patient expressed understanding - Patient has urinary incontinence? No. - Patient is safe to resume physical and sexual activity  6.  Health Maintenance - HM due items addressed Yes - Last pap smear  Diagnosis   Date Value Ref Range Status  08/02/2022   Final   - Negative for intraepithelial lesion or malignancy (NILM)   Pap smear not done at today's visit.  -Breast Cancer screening indicated? No.   7. Chronic Disease/Pregnancy Condition follow up: Gestational Diabetes  No iud strings seen, will order pelvic u/s for evaluation.  Pt is aware of findings. Pt will need to schedule for postpartum 2 hour GTT follow up  Warden Fillers, MD Center for [facility], [facility]

## 2023-01-19 LAB — CERVICOVAGINAL ANCILLARY ONLY
Bacterial Vaginitis (gardnerella): POSITIVE — AB
Candida Glabrata: NEGATIVE
Candida Vaginitis: NEGATIVE
Chlamydia: NEGATIVE
Comment: NEGATIVE
Comment: NEGATIVE
Comment: NEGATIVE
Comment: NEGATIVE
Comment: NEGATIVE
Comment: NORMAL
Neisseria Gonorrhea: NEGATIVE
Trichomonas: NEGATIVE

## 2023-01-22 ENCOUNTER — Ambulatory Visit (HOSPITAL_COMMUNITY): Payer: Medicaid Other

## 2023-01-24 ENCOUNTER — Other Ambulatory Visit: Payer: Medicaid Other

## 2023-01-24 ENCOUNTER — Telehealth: Payer: Self-pay | Admitting: Lactation Services

## 2023-01-24 ENCOUNTER — Other Ambulatory Visit: Payer: Self-pay

## 2023-01-24 DIAGNOSIS — O24429 Gestational diabetes mellitus in childbirth, unspecified control: Secondary | ICD-10-CM

## 2023-01-24 MED ORDER — METRONIDAZOLE 500 MG PO TABS: 500.0000 mg | ORAL_TABLET | Freq: Two times a day (BID) | ORAL | 0 refills | Status: DC

## 2023-01-24 NOTE — Telephone Encounter (Signed)
-----   Message from Warden Fillers sent at 01/22/2023 11:27 AM EST ----- BV noted on swab, will offer treatment

## 2023-01-24 NOTE — Telephone Encounter (Signed)
Called patient with results. She was informed of BV. She was given option for treatment and she wants treatment.   Reviewed to take Flagyl BID x 7 days with food. Reviewed if experiencing signs of yeast infections after antibiotic, let us know.   Patient voiced understanding.

## 2023-01-25 ENCOUNTER — Other Ambulatory Visit: Payer: Medicaid Other

## 2023-01-25 ENCOUNTER — Ambulatory Visit (HOSPITAL_COMMUNITY): Admission: RE | Admit: 2023-01-25 | Payer: Medicaid Other | Source: Ambulatory Visit

## 2023-01-26 ENCOUNTER — Other Ambulatory Visit: Payer: Medicaid Other

## 2023-02-01 ENCOUNTER — Other Ambulatory Visit: Payer: Medicaid Other

## 2023-02-02 ENCOUNTER — Other Ambulatory Visit: Payer: Medicaid Other

## 2023-02-02 ENCOUNTER — Ambulatory Visit (HOSPITAL_BASED_OUTPATIENT_CLINIC_OR_DEPARTMENT_OTHER)
Admission: RE | Admit: 2023-02-02 | Discharge: 2023-02-02 | Disposition: A | Discharge status: Home / Self Care | Payer: Medicaid Other | Source: Ambulatory Visit | Attending: Obstetrics and Gynecology | Admitting: Obstetrics and Gynecology

## 2023-02-02 ENCOUNTER — Ambulatory Visit: Payer: Medicaid Other | Admitting: Obstetrics & Gynecology

## 2023-02-02 DIAGNOSIS — T8332XA Displacement of intrauterine contraceptive device, initial encounter: Secondary | ICD-10-CM | POA: Insufficient documentation

## 2023-02-06 ENCOUNTER — Ambulatory Visit: Payer: Medicaid Other | Admitting: Family Medicine

## 2023-02-06 ENCOUNTER — Other Ambulatory Visit: Payer: Medicaid Other

## 2023-02-06 ENCOUNTER — Encounter: Payer: Self-pay | Admitting: Obstetrics and Gynecology

## 2023-02-10 ENCOUNTER — Ambulatory Visit (HOSPITAL_COMMUNITY)
Admission: EM | Admit: 2023-02-10 | Discharge: 2023-02-10 | Disposition: A | Discharge status: Home / Self Care | Payer: Medicaid Other | Attending: Behavioral Health | Admitting: Behavioral Health

## 2023-02-10 DIAGNOSIS — F319 Bipolar disorder, unspecified: Secondary | ICD-10-CM | POA: Insufficient documentation

## 2023-02-10 DIAGNOSIS — F603 Borderline personality disorder: Secondary | ICD-10-CM | POA: Insufficient documentation

## 2023-02-10 DIAGNOSIS — F53 Postpartum depression: Secondary | ICD-10-CM | POA: Insufficient documentation

## 2023-02-10 DIAGNOSIS — F419 Anxiety disorder, unspecified: Secondary | ICD-10-CM | POA: Insufficient documentation

## 2023-02-10 NOTE — ED Provider Notes (Addendum)
 Behavioral Health Urgent Care Medical Screening Exam  Patient Name: Tamara Bryant MRN: [id number] Date of Evaluation: 02/10/23 postpartum depression for the past 2 weeks. Chief Complaint:  depression  Diagnosis:  Final diagnoses:  Post partum depression    History of Present illness: Tamara Bryant is a 33 y.o. female patient with a reported psychiatric history significant for postpartum depression, anxiety, borderline personality, depression and bipolar disorder who presented to the [facility] behavioral health urgent care voluntary unaccompanied with a chief complaint of postpartum depression for the past 2 weeks.  Patient seen and evaluated face to face by this provider, chart reviewed and case discussed with Dr. Zouev.   Patient states that she will be 2 months postpartum on February 21, 2023. She reports a history of postpartum depression with her other 2 children who are 7 and 71 years old.  She reports feeling depressed for the past couple weeks. She reports worsening depression for the past week. She describes her depressive symptoms of sadness, lots of crying, anger, hopelessness, decreased energy, decreased motivation and feeling overwhelmed. She denies issues bonding with her infant or other 2 children. She denies thoughts of wanting to harm her children. She reports poor sleep due to waking up throughout the night with the baby.   She identifies current stressors as caring for three children, arguing with significant other, being home all day with the kids while her significant other is working.   She denies active suicidal thoughts. She reports that on Thursday of this week she felt frustrated and thought it would be better off not being here. She states, I thought it would be better to lay on the track but that's not what I was going to do. She reports only having the suicidal thought once. Patient states that she would never attempt suicide or try to harm herself in that  way because she loves her children, herself and family. She appears to have passive suicidal thoughts of death and not active suicidal ideations. She denies past suicide attempts. She denies recent self injurious behaviors and reports a history of self harm by cutting in high school. She denies HI. She denies AVH. There is no objective evidence that the patient is currently responding to internal or external stimuli.   Patient resides with her significant other and three children. She denies access to firearms in the home. She reports outpatient therapy at [facility] bi-weekly. She denies outpatient psychiatry. She denies taking prescribed medications for mental health. She states that in the past she was prescribed Wellbutrin. She denies drinking alcohol or using illicit drugs.   Plan of care. I dicussed with the patient recommendations for inpatient treatment for mood stabilization and medication management. Patient states that she wants to go to inpatient but she can't right now because she does not have anyone to take care of her three children and her significant other has to work quarry manager. Patient states that she is interested in starting medications to help with her depression. I discussed with the patient following up here at the [facility] Outpatient clinic for medication management. Patient agreeable to call and schedule an appointment on Monday or go to open access walk-in hours next week to establish services for medication management. Patient advised that if her symptoms worsen, she may return to the [facility], nearest ED or call 911 for a crisis assessment.   Patient gave verbal consent for this provider to speak with her significant other Tamara Bryant [phone] to  safety plan. Tamara Bryant states that he is aware that the patient is here at the behavioral health urgent care. He denies safety concerns with the patient discharging and returning home today. Safety planning completed. He  confirms that there are no firearms in the home. He was advised that the patient should not have access to lethal weapons, medications, sharp objects or hazardous chemicals and items should be removed and locked away as a safety precaution. If patient symptoms worsen, patient may return back to the [facility] behavioral health urgent care, nearest emergency department or call 911 for behavioral Health crisis assessment.   Flowsheet Row ED from 02/10/2023 in [facility] Admission (Discharged) from 12/21/2022 in [facility] 5S Mother Baby Unit Admission (Discharged) from 11/15/2022 in [facility] 1S Maternity Assessment Unit  C-SSRS RISK CATEGORY Moderate Risk No Risk No Risk       Psychiatric Specialty Exam  Presentation  General Appearance:Casual  Eye Contact:Fair  Speech:Clear and Coherent  Speech Volume:Decreased  Handedness:Right   Mood and Affect  Mood:Dysphoric  Affect:Congruent   Thought Process  Thought Processes:Coherent  Descriptions of Associations:Intact  Orientation:Full (Time, Place and Person)  Thought Content:Logical    Hallucinations:None  Ideas of Reference:None  Suicidal Thoughts:No  Homicidal Thoughts:No   Sensorium  Memory:Immediate Fair; Recent Fair; Remote Fair  Judgment:Fair  Insight:Fair   Executive Functions  Concentration:Fair  Attention Span:Fair  Recall:Fair  Fund of Knowledge:Fair  Language:Fair   Psychomotor Activity  Psychomotor Activity:Normal   Assets  Assets:Communication Skills; Desire for Improvement; Financial Resources/Insurance; Housing; Intimacy; Leisure Time; Physical Health; Social Support; Resilience; Transportation   Sleep  Sleep:Poor   Physical Exam: Physical Exam Cardiovascular:     Rate and Rhythm: Normal rate.  Pulmonary:     Effort: Pulmonary effort is normal.  Musculoskeletal:        General: Normal range of motion.     Cervical back: Normal range of motion.   Neurological:     Mental Status: She is alert and oriented to person, place, and time.    Review of Systems  Constitutional: Negative.   HENT: Negative.    Respiratory: Negative.    Cardiovascular: Negative.   Genitourinary: Negative.   Musculoskeletal: Negative.   Neurological: Negative.   Endo/Heme/Allergies: Negative.   Psychiatric/Behavioral:  Positive for depression.    Blood pressure (!) 135/92, pulse 92, temperature 97.7 F (36.5 C), temperature source Oral, resp. rate 18, height 5' 5 (1.651 m), weight 228 lb (103.4 kg), SpO2 99%, not currently breastfeeding. Body mass index is 37.94 kg/m.  Musculoskeletal: Strength & Muscle Tone: within normal limits Gait & Station: normal Patient leans: N/A   [facility] MSE Discharge Disposition for Follow up and Recommendations: Based on my evaluation the patient does not appear to have an emergency medical condition and can be discharged with resources and follow up care in outpatient services for Medication Management, Individual Therapy, and Group Therapy  Patient does not appear to be at imminent risk of dangerousness to self and dangerousness to others at this time. While future psychiatric events cannot be accurately predicted, the patient does not necessitate nor desire further acute inpatient psychiatric care at this time. This patient presents with risk factors that include passive SI two days ago. These are mitigated by protective factors which include lack of active SI/HI, no history of previous suicide attempts, no history of violence, sense of responsibility to family and social supports, presence of an available support system (mother, best friend, and siblings), expresses purpose for  living, and effective problem solving skills  Discharge recommendations:   Outpatient Follow up: Please review list of outpatient resources for psychiatry and counseling. Please follow up with your primary care provider for all medical related needs.    You are encouraged to follow up with [facility] for outpatient treatment.  Walk in/ Open Access Hours: Monday - Friday 8AM - 11AM (Please arrive at 7:00 AM)   [facility] [address] [place], [place] [phone]  Therapy: We recommend that patient participate in individual therapy to address mental health concerns.  Safety:   The following safety precautions should be taken:   No sharp objects. This includes scissors, razors, scrapers, and putty knives.   Chemicals should be removed and locked up.   Medications should be removed and locked up.   Weapons should be removed and locked up. This includes firearms, knives and instruments that can be used to cause injury.   The patient should abstain from use of illicit substances/drugs and abuse of any medications.  If symptoms worsen or do not continue to improve or if the patient becomes actively suicidal or homicidal then it is recommended that the patient return to the closest hospital emergency department, the [facility], or call 911 for further evaluation and treatment. National Suicide Prevention Lifeline 1-800-SUICIDE or 713-739-3808.  About 988 988 offers 24/7 access to trained crisis counselors who can help people experiencing mental health-related distress. People can call or text 988 or chat 988lifeline.org for themselves or if they are worried about a loved one who may need crisis support.    Teresa Wyline CROME, NP 02/10/2023, 4:42 PM

## 2023-02-10 NOTE — Discharge Instructions (Addendum)
 Discharge recommendations:   Outpatient Follow up: Please review list of outpatient resources for psychiatry and counseling. Please follow up with your primary care provider for all medical related needs.   You are encouraged to follow up with [facility] for outpatient treatment.  Walk in/ Open Access Hours: Monday - Friday 8AM - 11AM (Please arrive at 7:00 AM) [facility] [address] [place], [place] [phone]  Therapy: We recommend that patient participate in individual therapy to address mental health concerns.  Safety:   The following safety precautions should be taken:   No sharp objects. This includes scissors, razors, scrapers, and putty knives.   Chemicals should be removed and locked up.   Medications should be removed and locked up.   Weapons should be removed and locked up. This includes firearms, knives and instruments that can be used to cause injury.   The patient should abstain from use of illicit substances/drugs and abuse of any medications.  If symptoms worsen or do not continue to improve or if the patient becomes actively suicidal or homicidal then it is recommended that the patient return to the closest hospital emergency department, the [facility], or call 911 for further evaluation and treatment. National Suicide Prevention Lifeline 1-800-SUICIDE or (769)516-0650.  About 988 988 offers 24/7 access to trained crisis counselors who can help people experiencing mental health-related distress. People can call or text 988 or chat 988lifeline.org for themselves or if they are worried about a loved one who may need crisis support.

## 2023-02-10 NOTE — Progress Notes (Signed)
   02/10/23 1339  [facility] Triage Screening (Walk-ins at [facility] only)  How Did You Hear About Us ? Self  What Is the Reason for Your Visit/Call Today? Patient is a 33 year old female that presents this date as a voluntary walk in reporting ongoing post partum depression for the last two weeks associated with the birth of her one 6 old (getting ready to be 2 months on 2/19) patient reports that this is her third child. Patient denies any active SI although states she has felt overwhelmed in the past two weeks and has thought about, just laying on the railroad tracks. Patient reports one prior attempt 2 years ago when she, cut herself, although was unsure if that was an attempt to end her life or was just, relieving stress. Patient states she did not receive medical attention per that event. Patient reports a past history significant for MDD and GAD although states she has not been on medications for over two years. Patient cannot recall what those medications were but states she was receiving OP services from [facility] who assisted with her medication management. Patient states she continues to see a therapist at that location Randene Kitty) who she meets with weekly. Patient currently resides with her husband and three children, she denies any SA issues and states she is currently unemployed. Patient denies any HI or AVH. Patient denies any thoughts of wanting to harm her children. Patient reports ongoing depressive thoughts for the last two weeks to include feeling hopeless, excessive fatigue and lack of motivation.  How Long Has This Been Causing You Problems? 1 wk - 1 month  Have You Recently Had Any Thoughts About Hurting Yourself? Yes  How long ago did you have thoughts about hurting yourself? Within the last 72 hours  Are You Planning to Commit Suicide/Harm Yourself At This time? No  Have you Recently Had Thoughts About Hurting Someone Sherral? No  Are You Planning To Harm  Someone At This Time? No  Physical Abuse Denies  Verbal Abuse Denies  Sexual Abuse Denies  Exploitation of patient/patient's resources Denies  Self-Neglect Denies  Possible abuse reported to: Other (Comment) (NA)  Are you currently experiencing any auditory, visual or other hallucinations? No  Have You Used Any Alcohol or Drugs in the Past 24 Hours? No  Do you have any current medical co-morbidities that require immediate attention? No  Clinician description of patient physical appearance/behavior: Patient presents with a depressed affect  What Do You Feel Would Help You the Most Today? Treatment for Depression or other mood problem  If access to [facility] was not available, would you have sought care in the Emergency Department? No  Determination of Need Routine (7 days)  Options For Referral Other: Comment (To be determined)

## 2023-02-12 ENCOUNTER — Ambulatory Visit (INDEPENDENT_AMBULATORY_CARE_PROVIDER_SITE_OTHER): Payer: Medicaid Other | Admitting: Psychiatry

## 2023-02-12 ENCOUNTER — Encounter (HOSPITAL_COMMUNITY): Payer: Self-pay | Admitting: Psychiatry

## 2023-02-12 VITALS — BP 124/95 | HR 86 | Temp 98.1°F | Ht 65.0 in | Wt 237.6 lb

## 2023-02-12 DIAGNOSIS — F411 Generalized anxiety disorder: Secondary | ICD-10-CM

## 2023-02-12 DIAGNOSIS — F53 Postpartum depression: Secondary | ICD-10-CM

## 2023-02-12 MED ORDER — TRAZODONE HCL 50 MG PO TABS: 25.0000 mg | ORAL_TABLET | Freq: Every day | ORAL | 3 refills | Status: DC

## 2023-02-12 MED ORDER — HYDROXYZINE HCL 10 MG PO TABS: 10.0000 mg | ORAL_TABLET | Freq: Three times a day (TID) | ORAL | 3 refills | Status: DC | PRN

## 2023-02-12 MED ORDER — ARIPIPRAZOLE 5 MG PO TABS
5.0000 mg | ORAL_TABLET | Freq: Every day | ORAL | 3 refills | Status: DC
Start: 2023-02-12 — End: 2023-04-19

## 2023-02-12 NOTE — Progress Notes (Signed)
 Psychiatric Initial Adult Assessment   Patient Identification: Tamara Bryant MRN:  [id number] Date of Evaluation:  02/12/2023 Referral Source: [facility] Chief Complaint:  "It has been a lot" Visit Diagnosis:    ICD-10-CM   1. 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 Generalized anxiety disorder  F41.1 hydrOXYzine  (ATARAX ) 10 MG tablet    traZODone  (DESYREL ) 50 MG tablet      History of Present Illness: 33 year old female walks to the clinic today for medication management.  She was referred to outpatient psychiatry by [facility] where she presented on 10/10/2023 complaining of increased depression over the last 2 months.  She has a psychiatric history of postpartum depression, anxiety, borderline personality, depression and bipolar disorder.  Currently she is not managed on medications but has tried Wellbutrin, Depakote, Zoloft , Paxil, Latuda, NicoDerm patches and Ambien  in the past.  Today she is well-groomed, pleasant, cooperative, and engaged in conversation.  Patient notes that she has been going through a lot over the last year.  She informed Clinical research associate that she was homeless for a period of time.  She also notes that she has 2 children under the age of 1.  She reports that she is concerned about her mother who is now caring for her grandfather since her grandmother passed away last year.  Patient also notes that she constantly thinks about death as her younger brother committed suicide in 05/29/19.    Recently she notes that she has been having more anxiety attacks.  She notes that she finds it difficult to breathe, have increased heart palpitation, shortness of breath, and sweating today provider conducted a GAD-7 and patient scored a 20.  She reports that she worries about her children, husband, finances, and her family.  Provider also conducted PHQ-9 the patient scored a 17.  At times she reports that she has difficulty completing  tasks such as cleaning, bathing, cooking, paying bills.  Patient informed Clinical research associate that she is irritable, distracted, and has racing thoughts.  She denies other symptoms of mania.  Patient informed writer that at times she hears things in her home but denies it having hallucinations.  She informed Clinical research associate that she believes it is her home settling.  Patient states she purchases apartment however notes that small for the five of them.  Patient notes that she sleeps 3 to 4 hours nightly.  She informed Clinical research associate that her appetite has been poor.  Patient informed Clinical research associate that prior to being pregnant she smokes marijuana regularly.  She notes that she no longer utilizes the substance.  Patient notes that in 2015-05-29 she was charged for possession and distribution of marijuana.  She was placed on probation.  She also notes that with her brother passed away in 05-29-2019 she relied on alcohol but notes that she only drinks alcohol socially now.  She does Archivist that she smokes 8 to 10 cigarettes a day.  At this time patient is not interested in Nicorette gum or patch.  Patient informed Clinical research associate that she has tried several medications in the past however has been without medications for so long that she does not remember how effective they were.  Today she is agreeable to starting Abilify  5 mg to help manage mood and depressive state.  She is also agreeable to starting hydroxyzine  10 mg 3 times daily as needed to help manage anxiety.  Patient is also agreeable to starting trazodone  25 to 50  mg nightly as needed to help manage sleep. Potential side effects of medication and risks vs benefits of treatment vs non-treatment were explained and discussed. All questions were answered.  She will follow-[facility] [place].  No other concerns noted at this time.     Associated Signs/Symptoms: Depression Symptoms:  depressed mood, anhedonia, insomnia, psychomotor agitation, psychomotor  retardation, fatigue, feelings of worthlessness/guilt, difficulty concentrating, suicidal thoughts without plan, anxiety, panic attacks, weight gain, increased appetite, (Hypo) Manic Symptoms:  Distractibility, Elevated Mood, Flight of Ideas, Irritable Mood, Anxiety Symptoms:  Excessive Worry, Psychotic Symptoms:   Denies PTSD Symptoms: Had a traumatic exposure:  Reports that in 2021 her little brother committed suicide. She also notes that her grandmother passing last year  Past Psychiatric History: postpartum depression, anxiety, borderline personality, depression and bipolar disorder  Previous Psychotropic Medications:  Wellbutrin, Zoloft , Depakote, Paxil, Latuda, NicoDerm patches and Ambien   Substance Abuse History in the last 12 months:  No.  Consequences of Substance Abuse: Legal Consequences:  Possession/distrubution charge of marijuana in 2017.   Past Medical History:  Past Medical History:  Diagnosis Date   Anemia    Asthma    Gestational diabetes    High blood hemoglobin F (HCC) 10/09/2017   History of chlamydia and gonorrhea  08/13/2017   History of tobacco use    History of Trichomonas in Pregnancy 10/31/2017   Marijuana use    Mild intermittent asthma 08/13/2017   History of hospitalizations as an infant  Albuterol  prn only      Obesity    Seasonal allergies     Past Surgical History:  Procedure Laterality Date   WISDOM TOOTH EXTRACTION      Family Psychiatric History: Sister cutting behavior, mother undiagnosed mental health issues, maternal grandmother alcohol use, anxiety and depression, brother suicide in 2021  Family History:  Family History  Problem Relation Age of Onset   Hypertension Mother    Sickle cell anemia Mother    Diabetes Father    Hypertension Father    Diabetes Maternal Grandfather    Diabetes Paternal Grandmother     Social History:   Social History   Socioeconomic History   Marital status: Single    Spouse name: Not  on file   Number of children: Not on file   Years of education: Not on file   Highest education level: Not on file  Occupational History   Not on file  Tobacco Use   Smoking status: Every Day    Current packs/day: 0.25    Average packs/day: 0.3 packs/day for 13.1 years (3.3 ttl pk-yrs)    Types: Cigarettes    Start date: 2012   Smokeless tobacco: Former    Quit date: 07/09/2017   Tobacco comments:    07/06/2017  Vaping Use   Vaping status: Former  Substance and Sexual Activity   Alcohol use: Not Currently    Alcohol/week: 4.0 standard drinks of alcohol    Types: 1 Glasses of wine, 3 Shots of liquor per week   Drug use: Not Currently    Types: Marijuana, Cocaine    Comment: Cocaine June 2024, Marijauna April 2024   Sexual activity: Yes    Birth control/protection: None  Other Topics Concern   Not on file  Social History Narrative   Currently in school for business, entrepreneurship    Social Drivers of Health   Financial Resource Strain: Not at Risk (11/21/2022)   Received from [employer] [facility]  Resource Strain    Physicist, medical Strain: 1  Food Insecurity: No Food Insecurity (12/21/2022)   Hunger Vital Sign    Worried About Running Out of Food in the Last Year: Never true    Ran Out of Food in the Last Year: Never true  Transportation Needs: No Transportation Needs (12/21/2022)   PRAPARE - Administrator, Civil Service (Medical): No    Lack of Transportation (Non-Medical): No  Physical Activity: Not on File (09/21/2022)   Received from [facility]   Physical Activity    Physical Activity: 0  Stress: Not on File (09/21/2022)   Received from [facility]   Stress    Stress: 0  Social Connections: Not on File (09/21/2022)   Received from [facility]   Social Connections    Connectedness: 0    Additional Social History:  Patient resides in [place] with her fiance and 3 children. She is a stay at home mother. She smokes 8-10 cigarettes daily. She notes that she  discontinued marijuana and alcohol.  Allergies:  No Known Allergies  Metabolic Disorder Labs: Lab Results  Component Value Date   HGBA1C 4.6 (L) 08/02/2022   No results found for: "PROLACTIN" No results found for: "CHOL", "TRIG", "HDL", "CHOLHDL", "VLDL", "LDLCALC" Lab Results  Component Value Date   TSH 1.270 08/02/2022    Therapeutic Level Labs: No results found for: "LITHIUM" No results found for: "CBMZ" No results found for: "VALPROATE"  Current Medications: Current Outpatient Medications  Medication Sig Dispense Refill   ARIPiprazole  (ABILIFY ) 5 MG tablet Take 1 tablet (5 mg total) by mouth daily. 30 tablet 3   hydrOXYzine  (ATARAX ) 10 MG tablet Take 1 tablet (10 mg total) by mouth 3 (three) times daily as needed. 90 tablet 3   traZODone  (DESYREL ) 50 MG tablet Take 0.5-1 tablets (25-50 mg total) by mouth at bedtime. 30 tablet 3   albuterol  (ACCUNEB ) 0.63 MG/3ML nebulizer solution Take 1 ampule by nebulization every 6 (six) hours as needed.     albuterol  (VENTOLIN  HFA) 108 (90 Base) MCG/ACT inhaler Inhale 1-2 puffs into the lungs every 4 (four) hours as needed.     benzocaine -Menthol  (DERMOPLAST) 20-0.5 % AERO Apply 1 Application topically as needed for irritation (perineal discomfort). 85 g 0   ferrous sulfate  325 (65 FE) MG tablet Take 1 tablet (325 mg total) by mouth every other day. (Patient not taking: Reported on 01/18/2023) 20 tablet 0   ibuprofen  (ADVIL ) 600 MG tablet Take 1 tablet (600 mg total) by mouth every 6 (six) hours. 30 tablet 0   metroNIDAZOLE  (FLAGYL ) 500 MG tablet Take 1 tablet (500 mg total) by mouth 2 (two) times daily. 14 tablet 0   Prenatal Vit-Fe Fumarate-FA (PREPLUS) 27-1 MG TABS Take 1 tablet by mouth daily. 60 tablet 2   No current facility-administered medications for this visit.    Musculoskeletal: Strength & Muscle Tone: within normal limits Gait & Station: normal Patient leans: N/A  Psychiatric Specialty Exam: Review of Systems  not  currently breastfeeding.There is no height or weight on file to calculate BMI.  General Appearance: Well Groomed  Eye Contact:  Good  Speech:  Clear and Coherent and Normal Rate  Volume:  Normal  Mood:  Anxious and Depressed  Affect:  Appropriate and Congruent  Thought Process:  Coherent, Goal Directed, and Linear  Orientation:  Full (Time, Place, and Person)  Thought Content:  WDL and Logical  Suicidal Thoughts:  No  Homicidal Thoughts:  No  Memory:  Immediate;  Good Recent;   Good Remote;   Good  Judgement:  Good  Insight:  Good  Psychomotor Activity:  Normal  Concentration:  Concentration: Good and Attention Span: Good  Recall:  Good  Fund of Knowledge:Good  Language: Good  Akathisia:  No  Handed:  Right  AIMS (if indicated):  not done  Assets:  Communication Skills Desire for Improvement Financial Resources/Insurance Housing Intimacy Leisure Time Physical Health Social Support Talents/Skills Transportation  ADL's:  Intact  Cognition: WNL  Sleep:  Good   Screenings: GAD-7    Loss adjuster, chartered Office Visit from 02/12/2023 in [facility] from 05/02/2018 in [facility] Routine Prenatal from 02/27/2018 in Center for [facility] Routine Prenatal from 02/20/2018 in Center for [facility] Routine Prenatal from 02/13/2018 in Center for [facility]  Total GAD-7 Score 20 12 21 11 18       PHQ2-9    Flowsheet Row Office Visit from 02/12/2023 in [facility] from 05/02/2018 in Center for [facility] Routine Prenatal from 02/27/2018 in Center for [facility] Routine Prenatal from 02/20/2018 in Center for [facility] Routine Prenatal from 02/13/2018 in Center for [facility]  PHQ-2 Total Score 6 4 6 2 6   PHQ-9 Total Score 17  12 12 7 9       Flowsheet Row Office Visit from 02/12/2023 in [facility] ED from 02/10/2023 in [facility] Admission (Discharged) from 12/21/2022 in [facility]  C-SSRS RISK CATEGORY Error: Q7 should not be populated when Q6 is No Moderate Risk No Risk       Assessment and Plan: Patient endorses poor sleep, increased anxiety, and depression.Patient informed Clinical research associate that she has tried several medications in the past however has been without medications for so long that she does not remember how effective they were.  Today she is agreeable to starting Abilify  5 mg to help manage mood and depressive state.  She is also agreeable to starting hydroxyzine  10 mg 3 times daily as needed to help manage anxiety.  Patient is also agreeable to starting trazodone  25 to 50 mg nightly as needed to help manage sleep  1. Post partum depression (Primary)  Start- ARIPiprazole  (ABILIFY ) 5 MG tablet; Take 1 tablet (5 mg total) by mouth daily.  Dispense: 30 tablet; Refill: 3 Start- traZODone  (DESYREL ) 50 MG tablet; Take 0.5-1 tablets (25-50 mg total) by mouth at bedtime.  Dispense: 30 tablet; Refill: 3  2. Generalized anxiety disorder  Start- hydrOXYzine  (ATARAX ) 10 MG tablet; Take 1 tablet (10 mg total) by mouth 3 (three) times daily as needed.  Dispense: 90 tablet; Refill: 3 Start- traZODone  (DESYREL ) 50 MG tablet; Take 0.5-1 tablets (25-50 mg total) by mouth at bedtime.  Dispense: 30 tablet; Refill: 3   Collaboration of Care: Other provider involved in patient's care AEB PCP and counselor   Patient/Guardian was advised Release of Information must be obtained prior to any record release in order to collaborate their care with an outside provider. Patient/Guardian was advised if they have not already done so to contact the registration department to sign all necessary forms in order for us  to release information regarding their care.   Consent:  Patient/Guardian gives verbal consent for treatment and assignment of benefits for services provided during this visit. Patient/Guardian expressed understanding and agreed to proceed.   Follow up in 2.5  months  Follow up with threapy Arlyne Bering, NP 2/10/202510:25 AM

## 2023-02-20 ENCOUNTER — Telehealth (HOSPITAL_COMMUNITY): Payer: Self-pay | Admitting: *Deleted

## 2023-02-20 NOTE — Telephone Encounter (Signed)
Fax received for PA of Aripiprazole 5mg . Submitted online with cover my meds and approved. Called to notify pharmacy.

## 2023-02-28 ENCOUNTER — Ambulatory Visit: Payer: Medicaid Other

## 2023-04-19 ENCOUNTER — Encounter (HOSPITAL_COMMUNITY): Payer: Self-pay | Admitting: Psychiatry

## 2023-04-19 ENCOUNTER — Telehealth (HOSPITAL_COMMUNITY): Payer: Medicaid Other | Admitting: Psychiatry

## 2023-04-19 DIAGNOSIS — F411 Generalized anxiety disorder: Secondary | ICD-10-CM

## 2023-04-19 DIAGNOSIS — F53 Postpartum depression: Secondary | ICD-10-CM

## 2023-04-19 MED ORDER — HYDROXYZINE HCL 10 MG PO TABS: 10.0000 mg | ORAL_TABLET | Freq: Three times a day (TID) | ORAL | 3 refills | Status: DC | PRN

## 2023-04-19 MED ORDER — TRAZODONE HCL 50 MG PO TABS: 25.0000 mg | ORAL_TABLET | Freq: Every day | ORAL | 3 refills | Status: DC

## 2023-04-19 MED ORDER — ARIPIPRAZOLE 5 MG PO TABS: 5.0000 mg | ORAL_TABLET | Freq: Every day | ORAL | 3 refills | Status: DC

## 2023-04-19 NOTE — Progress Notes (Signed)
 BH MD/PA/NP OP Progress Note Virtual Visit via Video Note  I connected with Tamara Bryant on 04/19/23 at  1:30 PM EDT by a video enabled telemedicine application and verified that I am speaking with the correct person using two identifiers.  Location: Patient: Home Provider: Clinic   I discussed the limitations of evaluation and management by telemedicine and the availability of in person appointments. The patient expressed understanding and agreed to proceed.  I provided 30 minutes of non-face-to-face time during this encounter.   04/19/2023 11:54 AM Tamara Bryant  MRN:  [id number]  Chief Complaint: "I have not taking Trazodone or Hydroxyzine"  HPI: 33 year old female seen today for follow up medication management. She has a psychiatric history of postpartum depression, anxiety, borderline personality, depression and bipolar disorder.  Currently she managed on Abilify 5 mg daily, hydroxyzine 10 mg three times daily as needed, and Trazodone 50-100 mg nightly as needed.  Today she is well-groomed, pleasant, cooperative, and engaged in conversation.  Patient notes that she has not tried trazodone or hydroxyzine. She notes that she takes Abilify periodically. She informed Clinical research associate that she forgets to take her medications at times because she is tending two her children.   Since her last visit notes that her anxiety and depression has improved. She notes that she no longer has panic attack daily but twice a week.  Today provider conducted a GAD-7 and patient scored a 4, at her last visit she scores a 20.  Provider also conducted PHQ-9 the patient scored a 3, at her last visit she scored a 17. She endorse having adequate sleep and appetite.   Patient informed Clinical research associate that she smoke marijuana periodically. Provider informed patient marijuana can worsen her mental health. She endorsed understanding.   Overall patient notes that she is doing well. No medication changes made today. Patient  agreeable to continue medications as prescribed.   No other concerns noted at this time.      Visit Diagnosis:    ICD-10-CM   1. Generalized anxiety disorder  F41.1 hydrOXYzine (ATARAX) 10 MG tablet    traZODone (DESYREL) 50 MG tablet    2. Post partum depression  F53.0 traZODone (DESYREL) 50 MG tablet    ARIPiprazole (ABILIFY) 5 MG tablet      Past Psychiatric History: postpartum depression, anxiety, borderline personality, depression and bipolar disorder.  Past Medical History:  Past Medical History:  Diagnosis Date   Anemia    Asthma    Gestational diabetes    High blood hemoglobin F (HCC) 10/09/2017   History of chlamydia and gonorrhea  08/13/2017   History of tobacco use    History of Trichomonas in Pregnancy 10/31/2017   Marijuana use    Mild intermittent asthma 08/13/2017   History of hospitalizations as an infant  Albuterol prn only      Obesity    Seasonal allergies     Past Surgical History:  Procedure Laterality Date   WISDOM TOOTH EXTRACTION      Family Psychiatric History: : Sister cutting behavior, mother undiagnosed mental health issues, maternal grandmother alcohol use, anxiety and depression, brother suicide in 2021   Family History:  Family History  Problem Relation Age of Onset   Hypertension Mother    Sickle cell anemia Mother    Diabetes Father    Hypertension Father    Diabetes Maternal Grandfather    Diabetes Paternal Grandmother     Social History:  Social History   Socioeconomic History   Marital  status: Single    Spouse name: Not on file   Number of children: Not on file   Years of education: Not on file   Highest education level: Not on file  Occupational History   Not on file  Tobacco Use   Smoking status: Every Day    Current packs/day: 0.25    Average packs/day: 0.3 packs/day for 13.3 years (3.3 ttl pk-yrs)    Types: Cigarettes    Start date: 2012   Smokeless tobacco: Former    Quit date: 07/09/2017   Tobacco comments:     07/06/2017  Vaping Use   Vaping status: Former  Substance and Sexual Activity   Alcohol use: Not Currently    Alcohol/week: 4.0 standard drinks of alcohol    Types: 1 Glasses of wine, 3 Shots of liquor per week   Drug use: Not Currently    Types: Marijuana, Cocaine    Comment: Cocaine June 2024, Marijauna April 2024   Sexual activity: Yes    Birth control/protection: None  Other Topics Concern   Not on file  Social History Narrative   Currently in school for business, entrepreneurship    Social Drivers of Health   Financial Resource Strain: Not at Risk (11/21/2022)   Received from [employer]    Financial Resource Strain: 1  Food Insecurity: No Food Insecurity (12/21/2022)   Hunger Vital Sign    Worried About Running Out of Food in the Last Year: Never true    Ran Out of Food in the Last Year: Never true  Transportation Needs: No Transportation Needs (12/21/2022)   PRAPARE - Administrator, Civil Service (Medical): No    Lack of Transportation (Non-Medical): No  Physical Activity: Not on File (09/21/2022)   Received from [facility]   Physical Activity    Physical Activity: 0  Stress: Not on File (09/21/2022)   Received from [facility]   Stress    Stress: 0  Social Connections: Not on File (09/21/2022)   Received from [facility]   Social Connections    Connectedness: 0    Allergies: No Known Allergies  Metabolic Disorder Labs: Lab Results  Component Value Date   HGBA1C 4.6 (L) 08/02/2022   No results found for: "PROLACTIN" No results found for: "CHOL", "TRIG", "HDL", "CHOLHDL", "VLDL", "LDLCALC" Lab Results  Component Value Date   TSH 1.270 08/02/2022   TSH 0.572 12/03/2017    Therapeutic Level Labs: No results found for: "LITHIUM" No results found for: "VALPROATE" No results found for: "CBMZ"  Current Medications: Current Outpatient Medications  Medication Sig Dispense Refill   albuterol (ACCUNEB) 0.63 MG/3ML nebulizer solution  Take 1 ampule by nebulization every 6 (six) hours as needed.     albuterol (VENTOLIN HFA) 108 (90 Base) MCG/ACT inhaler Inhale 1-2 puffs into the lungs every 4 (four) hours as needed.     ARIPiprazole (ABILIFY) 5 MG tablet Take 1 tablet (5 mg total) by mouth daily. 30 tablet 3   benzocaine-Menthol (DERMOPLAST) 20-0.5 % AERO Apply 1 Application topically as needed for irritation (perineal discomfort). 85 g 0   ferrous sulfate 325 (65 FE) MG tablet Take 1 tablet (325 mg total) by mouth every other day. (Patient not taking: Reported on 01/18/2023) 20 tablet 0   hydrOXYzine (ATARAX) 10 MG tablet Take 1 tablet (10 mg total) by mouth 3 (three) times daily as needed. 90 tablet 3   ibuprofen (ADVIL) 600 MG tablet Take 1 tablet (600 mg total) by  mouth every 6 (six) hours. 30 tablet 0   metroNIDAZOLE (FLAGYL) 500 MG tablet Take 1 tablet (500 mg total) by mouth 2 (two) times daily. 14 tablet 0   Prenatal Vit-Fe Fumarate-FA (PREPLUS) 27-1 MG TABS Take 1 tablet by mouth daily. 60 tablet 2   traZODone (DESYREL) 50 MG tablet Take 0.5-1 tablets (25-50 mg total) by mouth at bedtime. 30 tablet 3   No current facility-administered medications for this visit.     Musculoskeletal: Strength & Muscle Tone: within normal limits and Telehealth visit Gait & Station: normal, Telehealth visit Patient leans: N/A  Psychiatric Specialty Exam: Review of Systems  not currently breastfeeding.There is no height or weight on file to calculate BMI.  General Appearance: Well Groomed  Eye Contact:  Good  Speech:  Clear and Coherent and Normal Rate  Volume:  Normal  Mood:  Euthymic  Affect:  Appropriate and Congruent  Thought Process:  Coherent, Goal Directed, and Linear  Orientation:  Full (Time, Place, and Person)  Thought Content: WDL and Logical   Suicidal Thoughts:  No  Homicidal Thoughts:  No  Memory:  Immediate;   Good Recent;   Good Remote;   Good  Judgement:  Good  Insight:  Good  Psychomotor Activity:   Normal  Concentration:  Concentration: Good and Attention Span: Good  Recall:  Good  Fund of Knowledge: Good  Language: Good  Akathisia:  No  Handed:  Right  AIMS (if indicated): not done  Assets:  Communication Skills Desire for Improvement Financial Resources/Insurance Housing Leisure Time Physical Health Resilience Social Support  ADL's:  Intact  Cognition: WNL  Sleep:  Good   Screenings: GAD-7    Flowsheet Row Video Visit from 04/19/2023 in [facility] Office Visit from 02/12/2023 in [facility] Integrated Behavioral Health from 05/02/2018 in Center for [facility] Routine Prenatal from 02/27/2018 in Center for [facility] Routine Prenatal from 02/20/2018 in Center for [facility]  Total GAD-7 Score 4 20 12 21 11       PHQ2-9    Flowsheet Row Video Visit from 04/19/2023 in [facility] Office Visit from 02/12/2023 in [facility] Integrated Behavioral Health from 05/02/2018 in Center for [facility] Routine Prenatal from 02/27/2018 in Center for [facility] Routine Prenatal from 02/20/2018 in Center for [facility]  PHQ-2 Total Score 1 6 4 6 2   PHQ-9 Total Score 3 17 12 12 7       Flowsheet Row Office Visit from 02/12/2023 in [facility] ED from 02/10/2023 in [facility] Admission (Discharged) from 12/21/2022 in [place] 5S Mother Baby Unit  C-SSRS RISK CATEGORY Error: Q7 should not be populated when Q6 is No Moderate Risk No Risk        Assessment and Plan: Overall patient notes that she is doing well. No medication changes made today. Patient agreeable to continue medications as prescribed.  1. Generalized anxiety disorder  Continue- hydrOXYzine (ATARAX) 10 MG tablet; Take 1 tablet (10 mg total) by mouth 3  (three) times daily as needed.  Dispense: 90 tablet; Refill: 3 Continue- traZODone (DESYREL) 50 MG tablet; Take 0.5-1 tablets (25-50 mg total) by mouth at bedtime.  Dispense: 30 tablet; Refill: 3  2. Post partum depression  Continue- traZODone (DESYREL) 50 MG tablet; Take 0.5-1 tablets (25-50 mg total) by mouth at bedtime.  Dispense: 30 tablet; Refill: 3 Continue- ARIPiprazole (  ABILIFY) 5 MG tablet; Take 1 tablet (5 mg total) by mouth daily.  Dispense: 30 tablet; Refill: 3   Collaboration of Care: Collaboration of Care: Other provider involved in patient's care AEB PCP  Patient/Guardian was advised Release of Information must be obtained prior to any record release in order to collaborate their care with an outside provider. Patient/Guardian was advised if they have not already done so to contact the registration department to sign all necessary forms in order for us  to release information regarding their care.   Consent: Patient/Guardian gives verbal consent for treatment and assignment of benefits for services provided during this visit. Patient/Guardian expressed understanding and agreed to proceed.   Follow up in 2 months Arlyne Bering, NP 04/19/2023, 11:54 AM

## 2023-05-22 ENCOUNTER — Emergency Department (HOSPITAL_COMMUNITY)
Admission: EM | Admit: 2023-05-22 | Discharge: 2023-05-23 | Discharge status: Against Medical Advice | Attending: Emergency Medicine | Admitting: Emergency Medicine

## 2023-05-22 ENCOUNTER — Ambulatory Visit: Admitting: Obstetrics and Gynecology

## 2023-05-22 ENCOUNTER — Other Ambulatory Visit: Payer: Self-pay

## 2023-05-22 ENCOUNTER — Emergency Department (HOSPITAL_COMMUNITY)

## 2023-05-22 DIAGNOSIS — Z5321 Procedure and treatment not carried out due to patient leaving prior to being seen by health care provider: Secondary | ICD-10-CM | POA: Insufficient documentation

## 2023-05-22 DIAGNOSIS — R059 Cough, unspecified: Secondary | ICD-10-CM | POA: Diagnosis present

## 2023-05-22 DIAGNOSIS — J45909 Unspecified asthma, uncomplicated: Secondary | ICD-10-CM | POA: Insufficient documentation

## 2023-05-22 LAB — CBC WITH DIFFERENTIAL/PLATELET
Abs Immature Granulocytes: 0.01 10*3/uL (ref 0.00–0.07)
Basophils Absolute: 0 10*3/uL (ref 0.0–0.1)
Basophils Relative: 0 %
Eosinophils Absolute: 0.2 10*3/uL (ref 0.0–0.5)
Eosinophils Relative: 2 %
HCT: 38 % (ref 36.0–46.0)
Hemoglobin: 12.6 g/dL (ref 12.0–15.0)
Immature Granulocytes: 0 %
Lymphocytes Relative: 40 %
Lymphs Abs: 3.1 10*3/uL (ref 0.7–4.0)
MCH: 27.2 pg (ref 26.0–34.0)
MCHC: 33.2 g/dL (ref 30.0–36.0)
MCV: 81.9 fL (ref 80.0–100.0)
Monocytes Absolute: 0.5 10*3/uL (ref 0.1–1.0)
Monocytes Relative: 7 %
Neutro Abs: 3.8 10*3/uL (ref 1.7–7.7)
Neutrophils Relative %: 51 %
Platelets: 276 10*3/uL (ref 150–400)
RBC: 4.64 MIL/uL (ref 3.87–5.11)
RDW: 16.2 % — ABNORMAL HIGH (ref 11.5–15.5)
WBC: 7.6 10*3/uL (ref 4.0–10.5)
nRBC: 0 % (ref 0.0–0.2)

## 2023-05-22 LAB — BASIC METABOLIC PANEL WITH GFR
Anion gap: 11 (ref 5–15)
BUN: 15 mg/dL (ref 6–20)
CO2: 20 mmol/L — ABNORMAL LOW (ref 22–32)
Calcium: 8.8 mg/dL — ABNORMAL LOW (ref 8.9–10.3)
Chloride: 108 mmol/L (ref 98–111)
Creatinine, Ser: 0.78 mg/dL (ref 0.44–1.00)
GFR, Estimated: >60 mL/min (ref >60)
Glucose, Bld: 125 mg/dL — ABNORMAL HIGH (ref 70–99)
Potassium: 3.2 mmol/L — ABNORMAL LOW (ref 3.5–5.1)
Sodium: 139 mmol/L (ref 135–145)

## 2023-05-22 MED ORDER — ALBUTEROL SULFATE (2.5 MG/3ML) 0.083% IN NEBU
2.5000 mg | INHALATION_SOLUTION | Freq: Once | RESPIRATORY_TRACT | Status: AC
  Administered 2023-05-22: 2.5 mg via RESPIRATORY_TRACT
  Filled 2023-05-22: qty 3

## 2023-05-22 NOTE — ED Notes (Signed)
 Pt decided to leave while waiting for a room.

## 2023-05-22 NOTE — ED Triage Notes (Signed)
 Patient reports asthma with dry cough/chest congestion and wheezing onset last night .

## 2023-05-23 LAB — HCG, SERUM, QUALITATIVE: Preg, Serum: NEGATIVE

## 2023-06-04 ENCOUNTER — Ambulatory Visit: Admitting: Family Medicine

## 2023-06-04 ENCOUNTER — Other Ambulatory Visit: Payer: Self-pay

## 2023-06-04 VITALS — BP 123/88 | HR 85 | Wt 240.1 lb

## 2023-06-04 DIAGNOSIS — T8332XA Displacement of intrauterine contraceptive device, initial encounter: Secondary | ICD-10-CM | POA: Diagnosis not present

## 2023-06-04 NOTE — Assessment & Plan Note (Signed)
 Patient presenting for IUD string check.  Approximately 6 cm of IUD string trimmed.  Concerned that the IUD is no longer in the fundus of the uterus but down in the lower uterine segment.  Strings trimmed to 3 cm.  Will obtain ultrasound to verify it is in the proper location.

## 2023-06-04 NOTE — Progress Notes (Signed)
    Subjective:  Tamara Bryant is a 33 y.o. female who presents to the clinic today for IUD string check   HPI: Patient presenting for evaluation for string check.  Reports that she feels IUD strings at her introitus which is bothersome.  No other concerns or questions.  Objective:  Physical Exam: BP 123/88   Pulse 85   Wt 240 lb 1.6 oz (108.9 kg)   BMI 39.95 kg/m   Gen: Alert, NAD, resting comfortably CV: Regular rate Pulm: Normal work of breathing, speaking in full sentences GU: Normal external female genitalia, normal vaginal tissue, long IUD string protruding from cervix.. MSK: no edema, cyanosis, or clubbing noted Skin: warm, dry Neuro: grossly normal, moves all extremities Psych: Normal affect and thought content  No results found for this or any previous visit (from the past 72 hours).   Assessment/Plan:  Malpositioned intrauterine device Patient presenting for IUD string check.  Approximately 6 cm of IUD string trimmed.  Concerned that the IUD is no longer in the fundus of the uterus but down in the lower uterine segment.  Strings trimmed to 3 cm.  Will obtain ultrasound to verify it is in the proper location.   Lab Orders  No laboratory test(s) ordered today    No orders of the defined types were placed in this encounter.     Janna Melter, MD Attending Family Medicine Physician, [facility] for [facility], [facility]   06/04/23 11:21 AM

## 2023-06-06 ENCOUNTER — Ambulatory Visit
Admission: RE | Admit: 2023-06-06 | Discharge: 2023-06-06 | Disposition: A | Discharge status: Home / Self Care | Source: Ambulatory Visit | Attending: Family Medicine | Admitting: Family Medicine

## 2023-06-06 DIAGNOSIS — T8332XA Displacement of intrauterine contraceptive device, initial encounter: Secondary | ICD-10-CM

## 2023-06-14 ENCOUNTER — Encounter (HOSPITAL_COMMUNITY): Payer: Self-pay | Admitting: Psychiatry

## 2023-06-14 ENCOUNTER — Telehealth (HOSPITAL_COMMUNITY): Admitting: Psychiatry

## 2023-06-14 DIAGNOSIS — F53 Postpartum depression: Secondary | ICD-10-CM

## 2023-06-14 MED ORDER — ARIPIPRAZOLE 5 MG PO TABS: 5.0000 mg | ORAL_TABLET | Freq: Every day | ORAL | 3 refills | Status: DC

## 2023-06-14 NOTE — Progress Notes (Signed)
 BH MD/PA/NP OP Progress Note Virtual Visit via Video Note  I connected with Lavella Poser on 06/14/23 at  2:30 PM EDT by a video enabled telemedicine application and verified that I am speaking with the correct person using two identifiers.  Location: Patient: Home Provider: Clinic   I discussed the limitations of evaluation and management by telemedicine and the availability of in person appointments. The patient expressed understanding and agreed to proceed.  I provided 30 minutes of non-face-to-face time during this encounter.   06/14/2023 1:49 PM WAYNETTA METHENY  MRN:  [id number]  Chief Complaint: Things are good. I take Abilify  sometimes  HPI: 33 year old female seen today for follow up medication management. She has a psychiatric history of postpartum depression, anxiety, borderline personality, depression and bipolar disorder.  Currently she managed on Abilify  5 mg daily. She notes that she takes it periodically  Today she is well-groomed, pleasant, cooperative, and engaged in conversation.  Patient notes that she takes Abilify  periodically.  She notes that life has been better and her depressive state has subsided some.  She informed Clinical research associate that she stays busy taking care of her children.  Financially she notes that things are still stressful but reports that she is able to cope.  Today provider conducted a GAD-7 and patient scored a 3, at her last visit she scores a 4.  Provider also conducted PHQ-9 the patient scored a 1, at her last visit she scored a 3. She endorse having adequate sleep and appetite.    Overall patient notes that she is doing well. She requested that Abilify  be refilled. No medication changes made today. Patient agreeable to continue medications as prescribed.   No other concerns noted at this time.      Visit Diagnosis:    ICD-10-CM   1. Post partum depression  F53.0 ARIPiprazole  (ABILIFY ) 5 MG tablet       Past Psychiatric History: postpartum  depression, anxiety, borderline personality, depression and bipolar disorder.  Past Medical History:  Past Medical History:  Diagnosis Date   Anemia    Asthma    Gestational diabetes    High blood hemoglobin F (HCC) 10/09/2017   History of chlamydia and gonorrhea  08/13/2017   History of tobacco use    History of Trichomonas in Pregnancy 10/31/2017   Marijuana use    Mild intermittent asthma 08/13/2017   History of hospitalizations as an infant  Albuterol  prn only      Obesity    Seasonal allergies     Past Surgical History:  Procedure Laterality Date   WISDOM TOOTH EXTRACTION      Family Psychiatric History: : Sister cutting behavior, mother undiagnosed mental health issues, maternal grandmother alcohol use, anxiety and depression, brother suicide in 2021   Family History:  Family History  Problem Relation Age of Onset   Hypertension Mother    Sickle cell anemia Mother    Diabetes Father    Hypertension Father    Diabetes Maternal Grandfather    Diabetes Paternal Grandmother     Social History:  Social History   Socioeconomic History   Marital status: Single    Spouse name: Not on file   Number of children: Not on file   Years of education: Not on file   Highest education level: Not on file  Occupational History   Not on file  Tobacco Use   Smoking status: Every Day    Current packs/day: 0.25    Average packs/day: 0.3 packs/day  for 13.4 years (3.4 ttl pk-yrs)    Types: Cigarettes    Start date: 2012   Smokeless tobacco: Former    Quit date: 07/09/2017   Tobacco comments:    07/06/2017  Vaping Use   Vaping status: Former  Substance and Sexual Activity   Alcohol use: Not Currently    Alcohol/week: 4.0 standard drinks of alcohol    Types: 1 Glasses of wine, 3 Shots of liquor per week   Drug use: Not Currently    Types: Marijuana, Cocaine    Comment: Cocaine June 2024, Marijauna April 2024   Sexual activity: Yes    Birth control/protection: None  Other  Topics Concern   Not on file  Social History Narrative   Currently in school for business, entrepreneurship    Social Drivers of Health   Financial Resource Strain: Not at Risk (11/21/2022)   Received from [employer]    Financial Resource Strain: 1  Food Insecurity: No Food Insecurity (12/21/2022)   Hunger Vital Sign    Worried About Running Out of Food in the Last Year: Never true    Ran Out of Food in the Last Year: Never true  Transportation Needs: No Transportation Needs (12/21/2022)   PRAPARE - Administrator, Civil Service (Medical): No    Lack of Transportation (Non-Medical): No  Physical Activity: Not on File (09/21/2022)   Received from [facility]   Physical Activity    Physical Activity: 0  Stress: Not on File (09/21/2022)   Received [facility]   Stress    Stress: 0  Social Connections: Not on File (09/21/2022)   Received from [facility]   Social Connections    Connectedness: 0    Allergies: No Known Allergies  Metabolic Disorder Labs: Lab Results  Component Value Date   HGBA1C 4.6 (L) 08/02/2022   No results found for: PROLACTIN No results found for: CHOL, TRIG, HDL, CHOLHDL, VLDL, LDLCALC Lab Results  Component Value Date   TSH 1.270 08/02/2022   TSH 0.572 12/03/2017    Therapeutic Level Labs: No results found for: LITHIUM No results found for: VALPROATE No results found for: CBMZ  Current Medications: Current Outpatient Medications  Medication Sig Dispense Refill   albuterol  (ACCUNEB ) 0.63 MG/3ML nebulizer solution Take 1 ampule by nebulization every 6 (six) hours as needed. (Patient not taking: Reported on 06/04/2023)     albuterol  (VENTOLIN  HFA) 108 (90 Base) MCG/ACT inhaler Inhale 1-2 puffs into the lungs every 4 (four) hours as needed. (Patient not taking: Reported on 06/04/2023)     ARIPiprazole  (ABILIFY ) 5 MG tablet Take 1 tablet (5 mg total) by mouth daily. 30 tablet 3   benzocaine -Menthol  (DERMOPLAST)  20-0.5 % AERO Apply 1 Application topically as needed for irritation (perineal discomfort). (Patient not taking: Reported on 06/04/2023) 85 g 0   ferrous sulfate  325 (65 FE) MG tablet Take 1 tablet (325 mg total) by mouth every other day. (Patient not taking: Reported on 06/04/2023) 20 tablet 0   hydrOXYzine  (ATARAX ) 10 MG tablet Take 1 tablet (10 mg total) by mouth 3 (three) times daily as needed. (Patient not taking: Reported on 06/04/2023) 90 tablet 3   ibuprofen  (ADVIL ) 600 MG tablet Take 1 tablet (600 mg total) by mouth every 6 (six) hours. (Patient not taking: Reported on 06/04/2023) 30 tablet 0   metroNIDAZOLE  (FLAGYL ) 500 MG tablet Take 1 tablet (500 mg total) by mouth 2 (two) times daily. (Patient not taking: Reported on 06/04/2023) 14  tablet 0   Prenatal Vit-Fe Fumarate-FA (PREPLUS) 27-1 MG TABS Take 1 tablet by mouth daily. (Patient not taking: Reported on 06/04/2023) 60 tablet 2   traZODone  (DESYREL ) 50 MG tablet Take 0.5-1 tablets (25-50 mg total) by mouth at bedtime. (Patient not taking: Reported on 06/04/2023) 30 tablet 3   No current facility-administered medications for this visit.     Musculoskeletal: Strength & Muscle Tone: within normal limits and Telehealth visit Gait & Station: normal, Telehealth visit Patient leans: N/A  Psychiatric Specialty Exam: Review of Systems  not currently breastfeeding.There is no height or weight on file to calculate BMI.  General Appearance: Well Groomed  Eye Contact:  Good  Speech:  Clear and Coherent and Normal Rate  Volume:  Normal  Mood:  Euthymic  Affect:  Appropriate and Congruent  Thought Process:  Coherent, Goal Directed, and Linear  Orientation:  Full (Time, Place, and Person)  Thought Content: WDL and Logical   Suicidal Thoughts:  No  Homicidal Thoughts:  No  Memory:  Immediate;   Good Recent;   Good Remote;   Good  Judgement:  Good  Insight:  Good  Psychomotor Activity:  Normal  Concentration:  Concentration: Good and Attention  Span: Good  Recall:  Good  Fund of Knowledge: Good  Language: Good  Akathisia:  No  Handed:  Right  AIMS (if indicated): not done  Assets:  Communication Skills Desire for Improvement Financial Resources/Insurance Housing Leisure Time Physical Health Resilience Social Support  ADL's:  Intact  Cognition: WNL  Sleep:  Good   Screenings: GAD-7    Flowsheet Row Video Visit from 06/14/2023 in [facility] Video Visit from 04/19/2023 in [facility] Office Visit from 02/12/2023 in [facility] Integrated Behavioral Health from 05/02/2018 in Center for [facility] Routine Prenatal from 02/27/2018 in Center for [facility]  Total GAD-7 Score 3 4 20 12 21    PHQ2-9    Flowsheet Row Video Visit from 06/14/2023 in [facility] Video Visit from 04/19/2023 in [facility] Office Visit from 02/12/2023 in [facility] Integrated Behavioral Health from 05/02/2018 in Center for [facility] Routine Prenatal from 02/27/2018 in Center for [place]  PHQ-2 Total Score 1 1 6 4 6   PHQ-9 Total Score 1 3 17 12 12    Flowsheet Row ED from 05/22/2023 in [facility] Emergency Department at [facility] Office Visit from 02/12/2023 in [facility] ED from 02/10/2023 in [facility]  C-SSRS RISK CATEGORY No Risk Error: Q7 should not be populated when Q6 is No Moderate Risk     Assessment and Plan: Patient notes that her anxiety, depression, sleep, and mood are well-managed.  She notes that she takes Abilify  periodically and requests that provider refill it today.  No medication changes made today. Patient agreeable to continue medications as prescribed.  1. Post partum depression  Continue- ARIPiprazole  (ABILIFY ) 5 MG  tablet; Take 1 tablet (5 mg total) by mouth daily.  Dispense: 30 tablet; Refill: 3     Collaboration of Care: Collaboration of Care: Other provider involved in patient's care AEB PCP  Patient/Guardian was advised Release of Information must be obtained prior to any record release in order to collaborate their care with an outside provider. Patient/Guardian was advised if they have not already done so to contact the registration department to sign all  necessary forms in order for us  to release information regarding their care.   Consent: Patient/Guardian gives verbal consent for treatment and assignment of benefits for services provided during this visit. Patient/Guardian expressed understanding and agreed to proceed.   Follow up in 2 months Arlyne Bering, NP 06/14/2023, 1:49 PM

## 2023-06-21 ENCOUNTER — Ambulatory Visit: Payer: Self-pay | Admitting: Family Medicine

## 2023-07-05 ENCOUNTER — Ambulatory Visit: Admitting: Obstetrics and Gynecology

## 2023-07-12 ENCOUNTER — Ambulatory Visit (HOSPITAL_COMMUNITY)
Admission: EM | Admit: 2023-07-12 | Discharge: 2023-07-12 | Disposition: A | Discharge status: Home / Self Care | Attending: Physician Assistant | Admitting: Physician Assistant

## 2023-07-12 ENCOUNTER — Ambulatory Visit (INDEPENDENT_AMBULATORY_CARE_PROVIDER_SITE_OTHER)

## 2023-07-12 ENCOUNTER — Encounter (HOSPITAL_COMMUNITY): Payer: Self-pay

## 2023-07-12 DIAGNOSIS — S93402A Sprain of unspecified ligament of left ankle, initial encounter: Secondary | ICD-10-CM | POA: Diagnosis not present

## 2023-07-12 DIAGNOSIS — S99912A Unspecified injury of left ankle, initial encounter: Secondary | ICD-10-CM

## 2023-07-12 DIAGNOSIS — Z3202 Encounter for pregnancy test, result negative: Secondary | ICD-10-CM

## 2023-07-12 LAB — POCT URINE PREGNANCY: Preg Test, Ur: NEGATIVE

## 2023-07-12 NOTE — ED Provider Notes (Signed)
 [facility]    CSN: [id number] Arrival date & time: 07/12/23  0920      History   Chief Complaint Chief Complaint  Patient presents with   Ankle Pain   Possible Pregnancy    HPI Tamara Bryant is a 33 y.o. female.   Patient reports that she slipped in applesauce on the floor at her child's school.  Patient complains of swelling and pain to her left ankle.  Patient reports she has been able to ambulate but does have some soreness.  Patient also request a pregnancy test.  Patient has not missed a menstrual cycle.  Patient denies any current symptoms of pregnancy.  The history is provided by the patient. No language interpreter was used.  Ankle Pain Possible Pregnancy    Past Medical History:  Diagnosis Date   Anemia    Asthma    Gestational diabetes    High blood hemoglobin F (HCC) 10/09/2017   History of chlamydia and gonorrhea  08/13/2017   History of tobacco use    History of Trichomonas in Pregnancy 10/31/2017   Marijuana use    Mild intermittent asthma 08/13/2017   History of hospitalizations as an infant  Albuterol  prn only      Obesity    Seasonal allergies     Patient Active Problem List   Diagnosis Date Noted   Malpositioned intrauterine device 06/04/2023   SVD (spontaneous vaginal delivery) 12/23/2022   Postpartum care following vaginal delivery 12/23/2022   Gestational diabetes mellitus (GDM) affecting pregnancy 12/21/2022   Encounter for insertion of intrauterine contraceptive device (IUD) 12/21/2022   Polyhydramnios affecting pregnancy 11/22/2022   Gestational diabetes mellitus (GDM), antepartum 10/18/2022   Anemia affecting pregnancy in third trimester 10/18/2022   Late prenatal care affecting pregnancy, antepartum 08/30/2022   Short interval between pregnancies affecting pregnancy in second trimester, antepartum 08/02/2022   Obesity affecting pregnancy in second trimester 08/02/2022   History of substance use 07/26/2022   BMI  39.0-39.9,adult 08/13/2017    Past Surgical History:  Procedure Laterality Date   WISDOM TOOTH EXTRACTION      OB History     Gravida  3   Para  3   Term  3   Preterm      AB      Living  3      SAB      IAB      Ectopic      Multiple  0   Live Births  3        Obstetric Comments  06/2021: in [place], [place]. Had GDM. They recommended meds but pt didn't start. Pt unsure of exact weight but was 7+ lbs.           Home Medications    Prior to Admission medications   Medication Sig Start Date End Date Taking? Authorizing Provider  albuterol  (ACCUNEB ) 0.63 MG/3ML nebulizer solution Take 1 ampule by nebulization every 6 (six) hours as needed. Patient not taking: Reported on 06/04/2023    [provider]  albuterol  (VENTOLIN  HFA) 108 (90 Base) MCG/ACT inhaler Inhale 1-2 puffs into the lungs every 4 (four) hours as needed. Patient not taking: Reported on 06/04/2023    [provider]  ARIPiprazole  (ABILIFY ) 5 MG tablet Take 1 tablet (5 mg total) by mouth daily. 06/14/23   Harl Zane FORBES, NP  benzocaine -Menthol  (DERMOPLAST) 20-0.5 % AERO Apply 1 Application topically as needed for irritation (perineal discomfort). Patient not taking: Reported on 06/04/2023 12/23/22  Warren-Hill, Camie LABOR, CNM  ferrous sulfate  325 (65 FE) MG tablet Take 1 tablet (325 mg total) by mouth every other day. Patient not taking: Reported on 06/04/2023 12/24/22   Regino Camie LABOR, CNM  hydrOXYzine  (ATARAX ) 10 MG tablet Take 1 tablet (10 mg total) by mouth 3 (three) times daily as needed. Patient not taking: Reported on 06/04/2023 04/19/23   Harl Zane BRAVO, NP  ibuprofen  (ADVIL ) 600 MG tablet Take 1 tablet (600 mg total) by mouth every 6 (six) hours. Patient not taking: Reported on 06/04/2023 12/23/22   Regino Camie A, CNM  metroNIDAZOLE  (FLAGYL ) 500 MG tablet Take 1 tablet (500 mg total) by mouth 2 (two) times daily. Patient not taking: Reported on 06/04/2023 01/24/23   Zina Jerilynn LABOR, MD  Prenatal Vit-Fe Fumarate-FA (PREPLUS) 27-1 MG TABS Take 1 tablet by mouth daily. Patient not taking: Reported on 06/04/2023 08/02/22   Izell Harari, MD  traZODone  (DESYREL ) 50 MG tablet Take 0.5-1 tablets (25-50 mg total) by mouth at bedtime. Patient not taking: Reported on 06/04/2023 04/19/23   Harl Zane BRAVO, NP    Family History Family History  Problem Relation Age of Onset   Hypertension Mother    Sickle cell anemia Mother    Diabetes Father    Hypertension Father    Diabetes Maternal Grandfather    Diabetes Paternal Grandmother     Social History Social History   Tobacco Use   Smoking status: Every Day    Current packs/day: 0.25    Average packs/day: 0.3 packs/day for 13.5 years (3.4 ttl pk-yrs)    Types: Cigarettes    Start date: 2012   Smokeless tobacco: Former    Quit date: 07/09/2017   Tobacco comments:    07/06/2017  Vaping Use   Vaping status: Former  Substance Use Topics   Alcohol use: Not Currently    Alcohol/week: 4.0 standard drinks of alcohol    Types: 1 Glasses of wine, 3 Shots of liquor per week   Drug use: Not Currently    Types: Marijuana, Cocaine    Comment: Cocaine June 2024, Marijauna April 2024     Allergies   Patient has no known allergies.   Review of Systems Review of Systems  All other systems reviewed and are negative.    Physical Exam Triage Vital Signs ED Triage Vitals [07/12/23 1011]  Encounter Vitals Group     BP (!) 123/92     Girls Systolic BP Percentile      Girls Diastolic BP Percentile      Boys Systolic BP Percentile      Boys Diastolic BP Percentile      Pulse Rate (!) 104     Resp 16     Temp 99.7 F (37.6 C)     Temp Source Oral     SpO2 97 %     Weight      Height      Head Circumference      Peak Flow      Pain Score 6     Pain Loc      Pain Education      Exclude from Growth Chart    No data found.  Updated Vital Signs BP (!) 123/92 (BP Location: Left Arm)   Pulse (!) 104    Temp 99.7 F (37.6 C) (Oral)   Resp 16   LMP 06/14/2023 (Approximate)   SpO2 97%   Visual Acuity Right Eye Distance:   Left Eye Distance:  Bilateral Distance:    Right Eye Near:   Left Eye Near:    Bilateral Near:     Physical Exam Vitals reviewed.  Musculoskeletal:        General: Swelling and tenderness present.     Comments: Tender left ankle, slight swelling, full range of motion neurovascular neurosensory intact.  Skin:    General: Skin is warm.  Neurological:     General: No focal deficit present.     Mental Status: She is alert and oriented to person, place, and time.      UC Treatments / Results  Labs (all labs ordered are listed, but only abnormal results are displayed) Labs Reviewed  POCT URINE PREGNANCY - Normal    EKG   Radiology DG Ankle Complete Left Result Date: 07/12/2023 CLINICAL DATA:  Two day history of left ankle pain after rolling injury EXAM: LEFT ANKLE COMPLETE - 3 VIEW COMPARISON:  None Available. FINDINGS: There are no findings of fracture or dislocation. Small joint effusion. There is no evidence of arthropathy or other focal bone abnormality. Ankle mortise is intact. Soft tissues are unremarkable. IMPRESSION: No acute fracture or dislocation. Small joint effusion. Electronically Signed   By: Limin  Xu M.D.   On: 07/12/2023 11:20    Procedures Procedures (including critical care time)  Medications Ordered in UC Medications - No data to display  Initial Impression / Assessment and Plan / UC Course  I have reviewed the triage vital signs and the nursing notes.  Pertinent labs & imaging results that were available during my care of the patient were reviewed by me and considered in my medical decision making (see chart for details).     Pregnancy test is negative.  X-ray left ankle shows no evidence of fracture.  Patient is counseled on results.  Patient is offered ASO or Ace wrap.  Patient declines.  Patient advised Tylenol  or ibuprofen   for discomfort return if any problems. Final Clinical Impressions(s) / UC Diagnoses   Final diagnoses:  Ankle injury, left, initial encounter  Sprain of left ankle, unspecified ligament, initial encounter     Discharge Instructions      Return if any problems.  Ibuprofen  for discomfort.     ED Prescriptions   None    PDMP not reviewed this encounter. An After Visit Summary was printed and given to the patient.       Flint Sonny POUR, PA-C 07/12/23 1144

## 2023-07-12 NOTE — Discharge Instructions (Signed)
Return if any problems.  Ibuprofen for discomfort 

## 2023-07-12 NOTE — ED Triage Notes (Addendum)
 Patient reports that she slipped on applesauce 2 days ago and turned her left ankle. Patient has swelling and pain to the top of the foot and left ankle.  Patient states she has been using ice for her left ankle/foot pain.   Patient added at the end of triage that she needed a pregnancy test.

## 2023-08-13 ENCOUNTER — Other Ambulatory Visit: Payer: Self-pay

## 2023-08-13 ENCOUNTER — Encounter: Payer: Self-pay | Admitting: Obstetrics and Gynecology

## 2023-08-13 ENCOUNTER — Ambulatory Visit: Admitting: Obstetrics and Gynecology

## 2023-08-13 VITALS — BP 145/103 | HR 83 | Wt 239.0 lb

## 2023-08-13 DIAGNOSIS — T8332XA Displacement of intrauterine contraceptive device, initial encounter: Secondary | ICD-10-CM

## 2023-08-13 DIAGNOSIS — Z1331 Encounter for screening for depression: Secondary | ICD-10-CM

## 2023-08-13 DIAGNOSIS — Z30430 Encounter for insertion of intrauterine contraceptive device: Secondary | ICD-10-CM

## 2023-08-13 DIAGNOSIS — Z3202 Encounter for pregnancy test, result negative: Secondary | ICD-10-CM | POA: Diagnosis not present

## 2023-08-13 DIAGNOSIS — Z30433 Encounter for removal and reinsertion of intrauterine contraceptive device: Secondary | ICD-10-CM | POA: Diagnosis not present

## 2023-08-13 LAB — POCT PREGNANCY, URINE: Preg Test, Ur: NEGATIVE

## 2023-08-13 MED ORDER — LEVONORGESTREL 20 MCG/DAY IU IUD
1.0000 | INTRAUTERINE_SYSTEM | Freq: Once | INTRAUTERINE | Status: AC
  Administered 2023-08-13 (×2): 1 via INTRAUTERINE

## 2023-08-13 NOTE — Progress Notes (Signed)
 GYNECOLOGY VISIT  Patient name: Tamara Bryant MRN [id number]  Date of birth: Jan 18, 1990 Chief Complaint:   Gynecologic Exam and Contractions  History:  RYLEE HUESTIS is a 33 y.o. [phone] being seen today for IUD follow up.  Ok with proceeding with IUD removal and insertion today. If IUD expels again, would not want another IUD at that time.   Past Medical History:  Diagnosis Date   Anemia    Asthma    Gestational diabetes    High blood hemoglobin F (HCC) 10/09/2017   History of chlamydia and gonorrhea  08/13/2017   History of tobacco use    History of Trichomonas in Pregnancy 10/31/2017   Marijuana use    Mild intermittent asthma 08/13/2017   History of hospitalizations as an infant  Albuterol  prn only      Obesity    Seasonal allergies     Past Surgical History:  Procedure Laterality Date   WISDOM TOOTH EXTRACTION      The following portions of the patient's history were reviewed and updated as appropriate: allergies, current medications, past family history, past medical history, past social history, past surgical history and problem list.   Health Maintenance:   Last pap     Component Value Date/Time   DIAGPAP  08/02/2022 1441    - Negative for intraepithelial lesion or malignancy (NILM)   DIAGPAP  09/25/2016 0000    NEGATIVE FOR INTRAEPITHELIAL LESIONS OR MALIGNANCY.   DIAGPAP  09/25/2016 0000    FUNGAL ORGANISMS PRESENT CONSISTENT WITH CANDIDA SPP.   DIAGPAP SHIFT IN FLORA SUGGESTIVE OF BACTERIAL VAGINOSIS. 09/25/2016 0000   ADEQPAP  08/02/2022 1441    Satisfactory for evaluation; transformation zone component PRESENT.   ADEQPAP  09/25/2016 0000    Satisfactory for evaluation  endocervical/transformation zone component ABSENT.     Last mammogram: n/a   Review of Systems:  Pertinent items are noted in HPI. Comprehensive review of systems was otherwise negative.   Objective:  Physical Exam BP (!) 137/101   Pulse 83   Wt 239 lb (108.4 kg)   LMP  08/10/2023   BMI 39.77 kg/m    Physical Exam Vitals and nursing note reviewed. Exam conducted with a chaperone present.  Constitutional:      Appearance: Normal appearance.  HENT:     Head: Normocephalic and atraumatic.  Pulmonary:     Effort: Pulmonary effort is normal.     Breath sounds: Normal breath sounds.  Genitourinary:    General: Normal vulva.     Exam position: Lithotomy position.     Vagina: Normal.     Cervix: Normal.  Skin:    General: Skin is warm and dry.  Neurological:     General: No focal deficit present.     Mental Status: She is alert.  Psychiatric:        Mood and Affect: Mood normal.        Behavior: Behavior normal.        Thought Content: Thought content normal.        Judgment: Judgment normal.      Labs and Imaging IMPRESSION: 1. IUD appears low within the lower uterine segment/endocervical canal. 2. No other acute finding by pelvic ultrasound.     Electronically Signed   By: CHRISTELLA.  Shick M.D.   On: 06/10/2023 17:08   IUD Removal  Patient identified, informed consent performed, consent signed.  Patient was in the dorsal lithotomy position, normal external genitalia was noted.  A speculum was placed in the patient's vagina, normal discharge was noted, no lesions. The cervix was visualized, no lesions, no abnormal discharge.  The strings of the IUD were visualized, grasped and pulled using ring forceps. The IUD was removed in its entirety. . Patient tolerated the procedure well.    IUD Insertion Procedure Note Patient identified, informed consent performed, consent signed.   Discussed risks of irregular bleeding, cramping, infection, malpositioning or misplacement of the IUD outside the uterus which may require further procedure such as laparoscopy. Also discussed >99% contraception efficacy, increased risk of ectopic pregnancy with failure of method.  Time out was performed.  Urine pregnancy test negative.  Speculum placed in the vagina.  Cervix  visualized.  Cleaned with Betadine x 2.  Anterior cervix grasped with a single tooth tenaculum.  Paracervical block was administered. Mirena  IUD placed per manufacturer's recommendations.  Strings trimmed to 3 cm. Tenaculum was removed, good hemostasis noted.  Patient tolerated procedure well.        Assessment & Plan:   1. Malpositioned intrauterine device (IUD), initial encounter (Primary) 2. Encounter for IUD insertion Now s/p uncomplicated IUD removal and insertion. Will return for IUD string check. Reviewed increased risk of expulsion given history. Patient was given post-procedure instructions.  She was advised to have backup contraception for one week.  Patient was also asked to check IUD strings periodically and follow up prn for IUD check.   Carter Quarry, MD Minimally Invasive Gynecologic Surgery Center for [facility], [facility]

## 2023-08-21 ENCOUNTER — Telehealth: Payer: Self-pay | Admitting: Obstetrics and Gynecology

## 2023-08-21 DIAGNOSIS — Z975 Presence of (intrauterine) contraceptive device: Secondary | ICD-10-CM

## 2023-08-21 NOTE — Telephone Encounter (Signed)
 Called patient. Patient verified DOB and name.  Patient stated she finished her period and now she is experiencing heavy bleeding w/ cramps.  CMA spoke to [place]  [facility], recommendation with pelvis US .  Ultrasound is scheduled for 08/23/2023.  Ermalinda GRADE CMA

## 2023-08-21 NOTE — Telephone Encounter (Signed)
 Tamara Bryant called to report severe cramping and heavy bleeding following her IUD placement. She is requesting a callback as soon as possible.

## 2023-08-23 ENCOUNTER — Ambulatory Visit (HOSPITAL_COMMUNITY)
Admission: RE | Admit: 2023-08-23 | Discharge: 2023-08-23 | Disposition: A | Discharge status: Home / Self Care | Source: Ambulatory Visit | Attending: Advanced Practice Midwife | Admitting: Advanced Practice Midwife

## 2023-08-23 DIAGNOSIS — Z975 Presence of (intrauterine) contraceptive device: Secondary | ICD-10-CM | POA: Diagnosis present

## 2023-08-29 ENCOUNTER — Telehealth: Payer: Self-pay | Admitting: *Deleted

## 2023-08-29 NOTE — Telephone Encounter (Signed)
 Tamara Bryant left a voice message this am requesting results of her US  done 8/21. Stating she hasn't got results yet and found out about her IUD placement and is still having problems.  Rock Skip PEAK

## 2023-09-02 ENCOUNTER — Ambulatory Visit: Payer: Self-pay | Admitting: Advanced Practice Midwife

## 2023-09-02 DIAGNOSIS — N921 Excessive and frequent menstruation with irregular cycle: Secondary | ICD-10-CM

## 2023-09-02 DIAGNOSIS — N946 Dysmenorrhea, unspecified: Secondary | ICD-10-CM

## 2023-09-02 MED ORDER — IBUPROFEN 600 MG PO TABS
600.0000 mg | ORAL_TABLET | Freq: Four times a day (QID) | ORAL | 1 refills | Status: DC
Start: 2023-09-02 — End: 2023-09-19

## 2023-09-04 NOTE — Telephone Encounter (Signed)
 Pt was contacted via MyChart message by Claudene, CNM for US  results on 09/02/23.  Message read.   [place], RN

## 2023-09-06 ENCOUNTER — Telehealth (HOSPITAL_COMMUNITY): Admitting: Family

## 2023-09-06 ENCOUNTER — Encounter (HOSPITAL_COMMUNITY): Payer: Self-pay

## 2023-09-13 ENCOUNTER — Telehealth (HOSPITAL_COMMUNITY): Admitting: Psychiatry

## 2023-09-13 ENCOUNTER — Encounter: Payer: Self-pay | Admitting: Obstetrics and Gynecology

## 2023-09-13 ENCOUNTER — Other Ambulatory Visit (HOSPITAL_COMMUNITY)
Admission: RE | Admit: 2023-09-13 | Discharge: 2023-09-13 | Disposition: A | Discharge status: Home / Self Care | Source: Ambulatory Visit | Attending: Obstetrics and Gynecology | Admitting: Obstetrics and Gynecology

## 2023-09-13 ENCOUNTER — Ambulatory Visit (INDEPENDENT_AMBULATORY_CARE_PROVIDER_SITE_OTHER): Admitting: Obstetrics and Gynecology

## 2023-09-13 ENCOUNTER — Other Ambulatory Visit: Payer: Self-pay

## 2023-09-13 VITALS — BP 126/86 | HR 86 | Wt 242.3 lb

## 2023-09-13 DIAGNOSIS — R350 Frequency of micturition: Secondary | ICD-10-CM | POA: Diagnosis not present

## 2023-09-13 DIAGNOSIS — Z975 Presence of (intrauterine) contraceptive device: Secondary | ICD-10-CM

## 2023-09-13 DIAGNOSIS — Z3202 Encounter for pregnancy test, result negative: Secondary | ICD-10-CM | POA: Diagnosis not present

## 2023-09-13 DIAGNOSIS — N898 Other specified noninflammatory disorders of vagina: Secondary | ICD-10-CM | POA: Diagnosis present

## 2023-09-13 DIAGNOSIS — Z1331 Encounter for screening for depression: Secondary | ICD-10-CM | POA: Diagnosis not present

## 2023-09-13 LAB — POCT URINALYSIS DIP (DEVICE)
Bilirubin Urine: NEGATIVE
Glucose, UA: NEGATIVE mg/dL
Hgb urine dipstick: NEGATIVE
Ketones, ur: NEGATIVE mg/dL
Nitrite: NEGATIVE
Protein, ur: NEGATIVE mg/dL
Specific Gravity, Urine: 1.02 (ref 1.005–1.030)
Urobilinogen, UA: 0.2 mg/dL (ref 0.0–1.0)
pH: 6.5 (ref 5.0–8.0)

## 2023-09-13 LAB — POCT PREGNANCY, URINE: Preg Test, Ur: NEGATIVE

## 2023-09-13 NOTE — Progress Notes (Signed)
 GYNECOLOGY VISIT  Patient name: Tamara Bryant MRN [id number]  Date of birth: 09-19-1990 Chief Complaint:   string check  History:  Tamara Bryant 32 y.o. [id number] has been having some abnormal vaginal discharge and intermittent cramping and itching. Also reports having urinary frequency. No menses since IUD inserted. A little pain after intercourse but not too painful. Would like testing to be sure itching an discharge not due to STI.   The following portions of the patient's history were reviewed and updated as appropriate: allergies, current medications, past family history, past medical history, past social history, past surgical history and problem list.   Health Maintenance:   Last pap     Component Value Date/Time   DIAGPAP  08/02/2022 1441    - Negative for intraepithelial lesion or malignancy (NILM)   DIAGPAP  09/25/2016 0000    NEGATIVE FOR INTRAEPITHELIAL LESIONS OR MALIGNANCY.   DIAGPAP  09/25/2016 0000    FUNGAL ORGANISMS PRESENT CONSISTENT WITH CANDIDA SPP.   DIAGPAP SHIFT IN FLORA SUGGESTIVE OF BACTERIAL VAGINOSIS. 09/25/2016 0000   ADEQPAP  08/02/2022 1441    Satisfactory for evaluation; transformation zone component PRESENT.   ADEQPAP  09/25/2016 0000    Satisfactory for evaluation  endocervical/transformation zone component ABSENT.    Health Maintenance  Topic Date Due   Pneumococcal Vaccine (1 of 2 - PCV) Never done   HPV Vaccine (2 - Risk 3-dose series) 03/05/2020   Flu Shot  08/03/2023   COVID-19 Vaccine (2 - 2025-26 season) 09/03/2023   Pap with HPV screening  08/01/2025   DTaP/Tdap/Td vaccine (8 - Td or Tdap) 10/15/2032   Hepatitis B Vaccine  Completed   Hepatitis C Screening  Completed   HIV Screening  Completed   Meningitis B Vaccine  Aged Out      Review of Systems:  Pertinent items are noted in HPI. Comprehensive review of systems was otherwise negative.   Objective:  Physical Exam BP 126/86   Pulse 86   Wt 242 lb 4.8 oz (109.9 kg)    LMP 08/10/2023   BMI 40.32 kg/m    Physical Exam Vitals and nursing note reviewed. Exam conducted with a chaperone present.  Constitutional:      Appearance: Normal appearance.  HENT:     Head: Normocephalic and atraumatic.  Pulmonary:     Effort: Pulmonary effort is normal.  Genitourinary:    General: Normal vulva.     Vagina: Normal.     Cervix: Normal.     Comments: IUD strings visualized, no extrusion of device Skin:    General: Skin is warm and dry.  Neurological:     General: No focal deficit present.     Mental Status: She is alert.  Psychiatric:        Mood and Affect: Mood normal.        Behavior: Behavior normal.        Thought Content: Thought content normal.        Judgment: Judgment normal.      Labs and Imaging US  PELVIC COMPLETE WITH TRANSVAGINAL Result Date: 09/01/2023 CLINICAL DATA:  Concern for malposition IUD EXAM: TRANSABDOMINAL AND TRANSVAGINAL ULTRASOUND OF PELVIS TECHNIQUE: Both transabdominal and transvaginal ultrasound examinations of the pelvis were performed. Transabdominal technique was performed for global imaging of the pelvis including uterus, ovaries, adnexal regions, and pelvic cul-de-sac. It was necessary to proceed with endovaginal exam following the transabdominal exam to visualize the uterus and adnexae in better detail. COMPARISON:  06/06/2023  FINDINGS: Uterus Measurements: 9.3 x 4.7 x 6.1 cm = volume: 139 mL. No fibroids or other mass visualized. Retroflexed position. Endometrium Thickness: 6.8 mm. No focal abnormality visualized. Echogenic linear shadowing IUD appears appropriately position within the endometrial cavity. Right ovary Nonvisualized Left ovary Measurements: 2.4 x 2.1 x 3.0 cm = volume: 7.7 mL. Normal appearance/no adnexal mass. Several small scattered follicles noted. Other findings No abnormal free fluid. Limited exam because of bowel gas and retroflexed uterine position. IMPRESSION: 1. IUD appears appropriately position within  the endometrial cavity. 2. Nonvisualized right ovary. 3. No other acute finding by pelvic ultrasound. Electronically Signed   By: CHRISTELLA.  Shick M.D.   On: 09/01/2023 15:12       Assessment & Plan:   1. Vaginal irritation (Primary) Vaginitis swab collected - Cervicovaginal ancillary only  2. Urinary frequency UPT negative. Trace leukocytes, otherwise negative UA, will follow up UCx  3. IUD (intrauterine device) in place IUD strings visualized. Pelvic US  demonstrates IUD in correct location    Carter Quarry, MD Minimally Invasive Gynecologic Surgery Center for [facility], [facility]

## 2023-09-17 LAB — CERVICOVAGINAL ANCILLARY ONLY
Bacterial Vaginitis (gardnerella): POSITIVE — AB
Candida Glabrata: NEGATIVE
Candida Vaginitis: POSITIVE — AB
Chlamydia: NEGATIVE
Comment: NEGATIVE
Comment: NEGATIVE
Comment: NEGATIVE
Comment: NEGATIVE
Comment: NEGATIVE
Comment: NORMAL
Neisseria Gonorrhea: NEGATIVE
Trichomonas: NEGATIVE

## 2023-09-18 ENCOUNTER — Ambulatory Visit: Payer: Self-pay | Admitting: Obstetrics and Gynecology

## 2023-09-18 DIAGNOSIS — B3731 Acute candidiasis of vulva and vagina: Secondary | ICD-10-CM

## 2023-09-18 DIAGNOSIS — B9689 Other specified bacterial agents as the cause of diseases classified elsewhere: Secondary | ICD-10-CM

## 2023-09-18 MED ORDER — FLUCONAZOLE 150 MG PO TABS: 150.0000 mg | ORAL_TABLET | Freq: Once | ORAL | 1 refills | Status: AC

## 2023-09-18 MED ORDER — METRONIDAZOLE 500 MG PO TABS
500.0000 mg | ORAL_TABLET | Freq: Two times a day (BID) | ORAL | 0 refills | Status: DC
Start: 2023-09-18 — End: 2023-09-19

## 2023-09-19 ENCOUNTER — Ambulatory Visit (HOSPITAL_COMMUNITY)
Admission: EM | Admit: 2023-09-19 | Discharge: 2023-09-19 | Disposition: A | Discharge status: Home / Self Care | Attending: Physician Assistant | Admitting: Physician Assistant

## 2023-09-19 ENCOUNTER — Ambulatory Visit (HOSPITAL_COMMUNITY): Payer: Self-pay | Admitting: Physician Assistant

## 2023-09-19 ENCOUNTER — Encounter (HOSPITAL_COMMUNITY): Payer: Self-pay | Admitting: *Deleted

## 2023-09-19 ENCOUNTER — Ambulatory Visit (INDEPENDENT_AMBULATORY_CARE_PROVIDER_SITE_OTHER)

## 2023-09-19 DIAGNOSIS — M545 Low back pain, unspecified: Secondary | ICD-10-CM

## 2023-09-19 DIAGNOSIS — S161XXA Strain of muscle, fascia and tendon at neck level, initial encounter: Secondary | ICD-10-CM

## 2023-09-19 MED ORDER — NAPROXEN 375 MG PO TABS: 375.0000 mg | ORAL_TABLET | Freq: Two times a day (BID) | ORAL | 0 refills | Status: AC

## 2023-09-19 MED ORDER — LIDOCAINE 5 % EX PTCH: 1.0000 | MEDICATED_PATCH | CUTANEOUS | 0 refills | Status: AC

## 2023-09-19 MED ORDER — METHOCARBAMOL 500 MG PO TABS: 500.0000 mg | ORAL_TABLET | Freq: Two times a day (BID) | ORAL | 0 refills | Status: AC
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
## 2023-09-19 NOTE — Discharge Instructions (Signed)
 I did not see anything on your x-ray and we will call you if the radiologist sees something that I do not.  I believe that you have injured the muscles in your back.  Please start Naprosyn  twice a day.  Do not take other NSAIDs with this medication including aspirin , ibuprofen /Advil , naproxen /Aleve .  You can use acetaminophen /Tylenol  for breakthrough pain.  Take Robaxin  up to 2 times a day.  This will make you sleepy so do not drive or drink alcohol taking it.  Apply lidocaine  patch for 12 hours during the day; remove this for 12 hours at night.  Use only 1 patch per 24 hours.  I recommend you follow-up with sports medicine; call to schedule an appointment.  If anything worsens and you have severe pain, going to the bathroom on yourself without noticing it, numbness or tingling in your legs, weakness in your legs you need to be seen immediately.

## 2023-10-26 ENCOUNTER — Encounter (HOSPITAL_COMMUNITY): Payer: Self-pay

## 2023-10-26 ENCOUNTER — Emergency Department (HOSPITAL_COMMUNITY)

## 2023-10-26 ENCOUNTER — Other Ambulatory Visit: Payer: Self-pay

## 2023-10-26 ENCOUNTER — Emergency Department (HOSPITAL_COMMUNITY): Admission: EM | Admit: 2023-10-26 | Discharge: 2023-10-27 | Discharge status: Against Medical Advice

## 2023-10-26 DIAGNOSIS — Z5321 Procedure and treatment not carried out due to patient leaving prior to being seen by health care provider: Secondary | ICD-10-CM | POA: Diagnosis not present

## 2023-10-26 DIAGNOSIS — R0602 Shortness of breath: Secondary | ICD-10-CM | POA: Diagnosis not present

## 2023-10-26 DIAGNOSIS — R059 Cough, unspecified: Secondary | ICD-10-CM | POA: Insufficient documentation

## 2023-10-26 LAB — COMPREHENSIVE METABOLIC PANEL WITH GFR
ALT: 28 U/L (ref 0–44)
AST: 29 U/L (ref 15–41)
Albumin: 3.5 g/dL (ref 3.5–5.0)
Alkaline Phosphatase: 49 U/L (ref 38–126)
Anion gap: 12 (ref 5–15)
BUN: 16 mg/dL (ref 6–20)
CO2: 21 mmol/L — ABNORMAL LOW (ref 22–32)
Calcium: 9 mg/dL (ref 8.9–10.3)
Chloride: 104 mmol/L (ref 98–111)
Creatinine, Ser: 0.95 mg/dL (ref 0.44–1.00)
GFR, Estimated: >60 mL/min (ref >60)
Glucose, Bld: 107 mg/dL — ABNORMAL HIGH (ref 70–99)
Potassium: 3.6 mmol/L (ref 3.5–5.1)
Sodium: 137 mmol/L (ref 135–145)
Total Bilirubin: 0.6 mg/dL (ref 0.0–1.2)
Total Protein: 6.1 g/dL — ABNORMAL LOW (ref 6.5–8.1)

## 2023-10-26 LAB — CBC
HCT: 47.1 % — ABNORMAL HIGH (ref 36.0–46.0)
Hemoglobin: 15.8 g/dL — ABNORMAL HIGH (ref 12.0–15.0)
MCH: 28 pg (ref 26.0–34.0)
MCHC: 33.5 g/dL (ref 30.0–36.0)
MCV: 83.4 fL (ref 80.0–100.0)
Platelets: 318 K/uL (ref 150–400)
RBC: 5.65 MIL/uL — ABNORMAL HIGH (ref 3.87–5.11)
RDW: 16.1 % — ABNORMAL HIGH (ref 11.5–15.5)
WBC: 9.2 K/uL (ref 4.0–10.5)
nRBC: 0 % (ref 0.0–0.2)

## 2023-10-26 LAB — RESP PANEL BY RT-PCR (RSV, FLU A&B, COVID)  RVPGX2
Influenza A by PCR: NEGATIVE
Influenza B by PCR: NEGATIVE
Resp Syncytial Virus by PCR: NEGATIVE
SARS Coronavirus 2 by RT PCR: NEGATIVE

## 2023-10-26 LAB — HCG, SERUM, QUALITATIVE: Preg, Serum: NEGATIVE

## 2023-10-26 NOTE — ED Provider Triage Note (Signed)
 Emergency Medicine Provider Triage Evaluation Note  Tamara Bryant , a 33 y.o. female  was evaluated in triage.  Pt complains of cough and shortness and breath. Hx of asthma. Patient states that cough is mainly dry only producing a small amount of mucus. Cough has been present for 3-4 days. No fever, chills, chest pain other than when talking. Throat pain 8/10.   Review of Systems  Positive: Cough, congestion, shortness of breath Negative: Chest pain, abdominal pain  Physical Exam  BP (!) 120/91 (BP Location: Right Arm)   Pulse (!) 101   Temp 98.1 F (36.7 C)   Resp 16   Ht 5' 4 (1.626 m)   Wt 108.9 kg   SpO2 100%   BMI 41.20 kg/m  Gen:   Awake, no distress   Resp:  Diffuse wheezing present   MSK:   Moves extremities without difficulty  Other:  Talking in full sentences on room air, not in acute respiratory distress  Medical Decision Making  Medically screening exam initiated at 5:03 PM.  Appropriate orders placed.  Tamara Bryant was informed that the remainder of the evaluation will be completed by another provider, this initial triage assessment does not replace that evaluation, and the importance of remaining in the ED until their evaluation is complete.  Orders: CBC, CMP, pregnancy, respiratory panel, chest x-ray, EKG   Janetta Terrall FALCON, PA-C 10/26/23 1712

## 2023-10-26 NOTE — ED Triage Notes (Signed)
 First Nurse Note: Pt has a 4 day hx of cough and ShOB. + sick contacts.

## 2023-10-26 NOTE — ED Triage Notes (Signed)
 Pt came in via POV d/t coughing to the point she feels like she cannot catch her breath, congestion, runny nose, denies fevers. A/Ox4, rates her throat pain 8/10 during triage.

## 2023-10-26 NOTE — ED Notes (Signed)
Pt called for vitals x3 no answer 

## 2024-02-03 ENCOUNTER — Emergency Department (HOSPITAL_COMMUNITY): Payer: Self-pay

## 2024-02-03 ENCOUNTER — Emergency Department (HOSPITAL_COMMUNITY)
Admission: EM | Admit: 2024-02-03 | Discharge: 2024-02-03 | Disposition: A | Discharge status: Home / Self Care | Payer: Self-pay | Attending: Emergency Medicine | Admitting: Emergency Medicine

## 2024-02-03 ENCOUNTER — Other Ambulatory Visit: Payer: Self-pay

## 2024-02-03 DIAGNOSIS — Z7951 Long term (current) use of inhaled steroids: Secondary | ICD-10-CM | POA: Insufficient documentation

## 2024-02-03 DIAGNOSIS — J45909 Unspecified asthma, uncomplicated: Secondary | ICD-10-CM | POA: Insufficient documentation

## 2024-02-03 DIAGNOSIS — R109 Unspecified abdominal pain: Secondary | ICD-10-CM

## 2024-02-03 DIAGNOSIS — Z79899 Other long term (current) drug therapy: Secondary | ICD-10-CM | POA: Insufficient documentation

## 2024-02-03 DIAGNOSIS — R1031 Right lower quadrant pain: Secondary | ICD-10-CM | POA: Insufficient documentation

## 2024-02-03 LAB — COMPREHENSIVE METABOLIC PANEL WITH GFR
ALT: 23 U/L (ref 0–44)
AST: 19 U/L (ref 15–41)
Albumin: 4.2 g/dL (ref 3.5–5.0)
Alkaline Phosphatase: 63 U/L (ref 38–126)
Anion gap: 13 (ref 5–15)
BUN: 12 mg/dL (ref 6–20)
CO2: 21 mmol/L — ABNORMAL LOW (ref 22–32)
Calcium: 9 mg/dL (ref 8.9–10.3)
Chloride: 104 mmol/L (ref 98–111)
Creatinine, Ser: 0.77 mg/dL (ref 0.44–1.00)
GFR, Estimated: >60 mL/min
Glucose, Bld: 123 mg/dL — ABNORMAL HIGH (ref 70–99)
Potassium: 4.1 mmol/L (ref 3.5–5.1)
Sodium: 138 mmol/L (ref 135–145)
Total Bilirubin: 0.4 mg/dL (ref 0.0–1.2)
Total Protein: 6.8 g/dL (ref 6.5–8.1)

## 2024-02-03 LAB — URINALYSIS, ROUTINE W REFLEX MICROSCOPIC
Bacteria, UA: NONE SEEN
Bilirubin Urine: NEGATIVE
Glucose, UA: NEGATIVE mg/dL
Ketones, ur: NEGATIVE mg/dL
Leukocytes,Ua: NEGATIVE
Nitrite: NEGATIVE
Protein, ur: NEGATIVE mg/dL
Specific Gravity, Urine: 1.019 (ref 1.005–1.030)
pH: 6 (ref 5.0–8.0)

## 2024-02-03 LAB — WET PREP, GENITAL
Sperm: NONE SEEN
Trich, Wet Prep: NONE SEEN
WBC, Wet Prep HPF POC: >=10 — AB
Yeast Wet Prep HPF POC: NONE SEEN

## 2024-02-03 LAB — CBC WITH DIFFERENTIAL/PLATELET
Abs Immature Granulocytes: 0.03 10*3/uL (ref 0.00–0.07)
Basophils Absolute: 0.1 10*3/uL (ref 0.0–0.1)
Basophils Relative: 1 %
Eosinophils Absolute: 0.2 10*3/uL (ref 0.0–0.5)
Eosinophils Relative: 2 %
HCT: 45.7 % (ref 36.0–46.0)
Hemoglobin: 15.6 g/dL — ABNORMAL HIGH (ref 12.0–15.0)
Immature Granulocytes: 0 %
Lymphocytes Relative: 44 %
Lymphs Abs: 4.6 10*3/uL — ABNORMAL HIGH (ref 0.7–4.0)
MCH: 29.2 pg (ref 26.0–34.0)
MCHC: 34.1 g/dL (ref 30.0–36.0)
MCV: 85.6 fL (ref 80.0–100.0)
Monocytes Absolute: 0.4 10*3/uL (ref 0.1–1.0)
Monocytes Relative: 4 %
Neutro Abs: 5.2 10*3/uL (ref 1.7–7.7)
Neutrophils Relative %: 49 %
Platelets: 346 10*3/uL (ref 150–400)
RBC: 5.34 MIL/uL — ABNORMAL HIGH (ref 3.87–5.11)
RDW: 15.2 % (ref 11.5–15.5)
WBC: 10.5 10*3/uL (ref 4.0–10.5)
nRBC: 0 % (ref 0.0–0.2)

## 2024-02-03 LAB — HCG, SERUM, QUALITATIVE: Preg, Serum: NEGATIVE

## 2024-02-03 LAB — LIPASE, BLOOD: Lipase: 25 U/L (ref 11–51)

## 2024-02-03 LAB — HIV ANTIBODY (ROUTINE TESTING W REFLEX): HIV Screen 4th Generation wRfx: NONREACTIVE

## 2024-02-03 MED ORDER — ONDANSETRON HCL 4 MG/2ML IJ SOLN
4.0000 mg | Freq: Once | INTRAMUSCULAR | Status: AC
  Administered 2024-02-03: 4 mg via INTRAVENOUS
  Filled 2024-02-03: qty 2

## 2024-02-03 MED ORDER — CEFTRIAXONE SODIUM 500 MG IJ SOLR
500.0000 mg | Freq: Once | INTRAMUSCULAR | Status: AC
  Administered 2024-02-03: 500 mg via INTRAMUSCULAR
  Filled 2024-02-03: qty 500

## 2024-02-03 MED ORDER — METRONIDAZOLE 500 MG PO TABS: 500.0000 mg | ORAL_TABLET | Freq: Two times a day (BID) | ORAL | 0 refills | Status: AC

## 2024-02-03 MED ORDER — SODIUM CHLORIDE 0.9 % IV BOLUS
1000.0000 mL | Freq: Once | INTRAVENOUS | Status: AC
  Administered 2024-02-03: 1000 mL via INTRAVENOUS

## 2024-02-03 MED ORDER — ACETAMINOPHEN 500 MG PO TABS
1000.0000 mg | ORAL_TABLET | Freq: Once | ORAL | Status: AC
  Administered 2024-02-03: 1000 mg via ORAL
  Filled 2024-02-03: qty 2

## 2024-02-03 MED ORDER — DOXYCYCLINE HYCLATE 100 MG PO TABS
100.0000 mg | ORAL_TABLET | Freq: Once | ORAL | Status: AC
  Administered 2024-02-03: 100 mg via ORAL
  Filled 2024-02-03: qty 1

## 2024-02-03 MED ORDER — DOXYCYCLINE HYCLATE 100 MG PO TABS: 100.0000 mg | ORAL_TABLET | Freq: Two times a day (BID) | ORAL | 0 refills | Status: AC

## 2024-02-03 MED ORDER — IOHEXOL 350 MG/ML SOLN
75.0000 mL | Freq: Once | INTRAVENOUS | Status: AC | PRN
  Administered 2024-02-03: 75 mL via INTRAVENOUS

## 2024-02-03 MED ORDER — LIDOCAINE HCL (PF) 1 % IJ SOLN
1.0000 mL | Freq: Once | INTRAMUSCULAR | Status: AC
  Administered 2024-02-03: 1.2 mL
  Filled 2024-02-03: qty 5

## 2024-02-03 NOTE — Discharge Instructions (Addendum)
 Evaluation today for your abdominal pain was overall reassuring.  Will go ahead and treat empirically for STDs.  Also appears that you likely have bacterial vaginosis.  As we discussed please start the metronidazole  48 hours after your last consumption of alcohol and do not drink alcohol while you are taking the antibiotic.  Please follow-up with your PCP.

## 2024-02-03 NOTE — ED Triage Notes (Signed)
 Pt arrives POV with lower abdominal cramping along with nausea for the last few weeks.

## 2024-02-04 LAB — GC/CHLAMYDIA PROBE AMP (~~LOC~~) NOT AT ARMC
Chlamydia: NEGATIVE
Comment: NEGATIVE
Comment: NORMAL
Neisseria Gonorrhea: NEGATIVE

## 2024-02-04 LAB — SYPHILIS: RPR W/REFLEX TO RPR TITER AND TREPONEMAL ANTIBODIES, TRADITIONAL SCREENING AND DIAGNOSIS ALGORITHM: RPR Ser Ql: NONREACTIVE
